# Patient Record
Sex: Female | Born: 1945
Health system: Southern US, Community
[De-identification: ages and names within clinical notes are randomized; demographics above are authoritative.]

## PROBLEM LIST (undated history)

## (undated) DIAGNOSIS — M1990 Unspecified osteoarthritis, unspecified site: Secondary | ICD-10-CM

## (undated) DIAGNOSIS — J189 Pneumonia, unspecified organism: Secondary | ICD-10-CM

## (undated) DIAGNOSIS — J449 Chronic obstructive pulmonary disease, unspecified: Secondary | ICD-10-CM

## (undated) HISTORY — DX: Unspecified osteoarthritis, unspecified site: M19.90

## (undated) HISTORY — DX: Pneumonia, unspecified organism: J18.9

## (undated) HISTORY — DX: Chronic obstructive pulmonary disease, unspecified: J44.9

---

## 1968-01-05 HISTORY — PX: DILATION AND CURETTAGE OF UTERUS: SHX78

## 1997-12-09 ENCOUNTER — Ambulatory Visit (HOSPITAL_COMMUNITY): Admission: RE | Admit: 1997-12-09 | Discharge: 1997-12-09 | Payer: Self-pay | Admitting: *Deleted

## 1997-12-09 ENCOUNTER — Encounter: Payer: Self-pay | Admitting: *Deleted

## 1997-12-09 ENCOUNTER — Other Ambulatory Visit: Admission: RE | Admit: 1997-12-09 | Discharge: 1997-12-09 | Payer: Self-pay | Admitting: *Deleted

## 1998-12-30 ENCOUNTER — Encounter: Payer: Self-pay | Admitting: *Deleted

## 1998-12-30 ENCOUNTER — Ambulatory Visit (HOSPITAL_COMMUNITY): Admission: RE | Admit: 1998-12-30 | Discharge: 1998-12-30 | Payer: Self-pay | Admitting: *Deleted

## 1999-08-12 ENCOUNTER — Other Ambulatory Visit: Admission: RE | Admit: 1999-08-12 | Discharge: 1999-08-12 | Payer: Self-pay | Admitting: *Deleted

## 1999-12-31 ENCOUNTER — Encounter: Payer: Self-pay | Admitting: *Deleted

## 1999-12-31 ENCOUNTER — Ambulatory Visit (HOSPITAL_COMMUNITY): Admission: RE | Admit: 1999-12-31 | Discharge: 1999-12-31 | Payer: Self-pay | Admitting: *Deleted

## 2000-08-15 ENCOUNTER — Other Ambulatory Visit: Admission: RE | Admit: 2000-08-15 | Discharge: 2000-08-15 | Payer: Self-pay | Admitting: *Deleted

## 2001-04-13 ENCOUNTER — Ambulatory Visit (HOSPITAL_COMMUNITY): Admission: RE | Admit: 2001-04-13 | Discharge: 2001-04-13 | Payer: Self-pay | Admitting: Family Medicine

## 2001-09-20 ENCOUNTER — Other Ambulatory Visit: Admission: RE | Admit: 2001-09-20 | Discharge: 2001-09-20 | Payer: Self-pay | Admitting: Obstetrics and Gynecology

## 2003-05-10 ENCOUNTER — Ambulatory Visit (HOSPITAL_COMMUNITY): Admission: RE | Admit: 2003-05-10 | Discharge: 2003-05-10 | Payer: Self-pay | Admitting: Family Medicine

## 2003-05-14 ENCOUNTER — Other Ambulatory Visit: Admission: RE | Admit: 2003-05-14 | Discharge: 2003-05-14 | Payer: Self-pay | Admitting: Family Medicine

## 2003-07-22 ENCOUNTER — Encounter: Admission: RE | Admit: 2003-07-22 | Discharge: 2003-07-22 | Payer: Self-pay | Admitting: Family Medicine

## 2003-07-22 ENCOUNTER — Ambulatory Visit (HOSPITAL_COMMUNITY): Admission: RE | Admit: 2003-07-22 | Discharge: 2003-07-22 | Payer: Self-pay | Admitting: Family Medicine

## 2003-08-05 ENCOUNTER — Encounter (INDEPENDENT_AMBULATORY_CARE_PROVIDER_SITE_OTHER): Payer: Self-pay | Admitting: *Deleted

## 2003-08-05 LAB — CONVERTED CEMR LAB

## 2003-08-06 ENCOUNTER — Encounter: Admission: RE | Admit: 2003-08-06 | Discharge: 2003-08-06 | Payer: Self-pay | Admitting: Family Medicine

## 2003-08-13 ENCOUNTER — Encounter: Admission: RE | Admit: 2003-08-13 | Discharge: 2003-08-13 | Payer: Self-pay | Admitting: Family Medicine

## 2003-08-23 ENCOUNTER — Ambulatory Visit (HOSPITAL_COMMUNITY): Admission: RE | Admit: 2003-08-23 | Discharge: 2003-08-23 | Payer: Self-pay | Admitting: Sports Medicine

## 2003-08-23 ENCOUNTER — Encounter: Payer: Self-pay | Admitting: Cardiovascular Disease

## 2003-08-26 ENCOUNTER — Encounter: Admission: RE | Admit: 2003-08-26 | Discharge: 2003-08-26 | Payer: Self-pay | Admitting: Sports Medicine

## 2003-08-30 ENCOUNTER — Encounter: Admission: RE | Admit: 2003-08-30 | Discharge: 2003-08-30 | Payer: Self-pay | Admitting: Sports Medicine

## 2003-09-17 ENCOUNTER — Ambulatory Visit: Payer: Self-pay | Admitting: Family Medicine

## 2003-09-26 ENCOUNTER — Ambulatory Visit: Payer: Self-pay | Admitting: Family Medicine

## 2003-12-01 ENCOUNTER — Emergency Department (HOSPITAL_COMMUNITY): Admission: EM | Admit: 2003-12-01 | Discharge: 2003-12-01 | Payer: Self-pay | Admitting: Emergency Medicine

## 2006-03-03 DIAGNOSIS — F17200 Nicotine dependence, unspecified, uncomplicated: Secondary | ICD-10-CM | POA: Insufficient documentation

## 2006-03-03 DIAGNOSIS — Z87891 Personal history of nicotine dependence: Secondary | ICD-10-CM | POA: Insufficient documentation

## 2006-03-03 DIAGNOSIS — J449 Chronic obstructive pulmonary disease, unspecified: Secondary | ICD-10-CM | POA: Insufficient documentation

## 2006-03-04 ENCOUNTER — Encounter (INDEPENDENT_AMBULATORY_CARE_PROVIDER_SITE_OTHER): Payer: Self-pay | Admitting: *Deleted

## 2006-07-19 ENCOUNTER — Ambulatory Visit: Payer: Self-pay | Admitting: Family Medicine

## 2006-07-19 ENCOUNTER — Encounter (INDEPENDENT_AMBULATORY_CARE_PROVIDER_SITE_OTHER): Payer: Self-pay | Admitting: *Deleted

## 2006-07-19 LAB — CONVERTED CEMR LAB
ALT: 14 units/L (ref 0–35)
Alkaline Phosphatase: 77 units/L (ref 39–117)
Chloride: 106 meq/L (ref 96–112)
Glucose, Bld: 88 mg/dL (ref 70–99)
HDL: 64 mg/dL (ref >39)
LDL Cholesterol: 81 mg/dL (ref 0–99)
Potassium: 4.5 meq/L (ref 3.5–5.3)
Total CHOL/HDL Ratio: 2.5
Total Protein: 6.9 g/dL (ref 6.0–8.3)

## 2006-07-21 ENCOUNTER — Encounter (INDEPENDENT_AMBULATORY_CARE_PROVIDER_SITE_OTHER): Payer: Self-pay | Admitting: *Deleted

## 2006-07-22 ENCOUNTER — Encounter (INDEPENDENT_AMBULATORY_CARE_PROVIDER_SITE_OTHER): Payer: Self-pay | Admitting: *Deleted

## 2006-07-22 ENCOUNTER — Ambulatory Visit (HOSPITAL_COMMUNITY): Admission: RE | Admit: 2006-07-22 | Discharge: 2006-07-22 | Payer: Self-pay | Admitting: *Deleted

## 2006-07-26 ENCOUNTER — Encounter (INDEPENDENT_AMBULATORY_CARE_PROVIDER_SITE_OTHER): Payer: Self-pay | Admitting: *Deleted

## 2008-03-11 ENCOUNTER — Encounter: Payer: Self-pay | Admitting: Family Medicine

## 2008-03-11 ENCOUNTER — Ambulatory Visit (HOSPITAL_COMMUNITY): Admission: RE | Admit: 2008-03-11 | Discharge: 2008-03-11 | Payer: Self-pay | Admitting: Family Medicine

## 2008-03-11 ENCOUNTER — Ambulatory Visit: Payer: Self-pay | Admitting: Family Medicine

## 2008-03-11 DIAGNOSIS — L301 Dyshidrosis [pompholyx]: Secondary | ICD-10-CM | POA: Insufficient documentation

## 2008-03-11 LAB — CONVERTED CEMR LAB
ALT: 14 units/L (ref 0–35)
CO2: 28 meq/L (ref 19–32)
Calcium: 9.3 mg/dL (ref 8.4–10.5)
Chloride: 103 meq/L (ref 96–112)
Creatinine, Ser: 0.76 mg/dL (ref 0.40–1.20)
HDL: 87 mg/dL (ref >39)
MCHC: 31.7 g/dL (ref 30.0–36.0)
Potassium: 4 meq/L (ref 3.5–5.3)
RBC: 4.54 M/uL (ref 3.87–5.11)
Sodium: 141 meq/L (ref 135–145)
Total Bilirubin: 0.4 mg/dL (ref 0.3–1.2)
Total CHOL/HDL Ratio: 2.4
VLDL: 22 mg/dL (ref 0–40)
WBC: 5.4 10*3/uL (ref 4.0–10.5)

## 2008-03-14 ENCOUNTER — Ambulatory Visit (HOSPITAL_COMMUNITY): Admission: RE | Admit: 2008-03-14 | Discharge: 2008-03-14 | Payer: Self-pay | Admitting: Family Medicine

## 2008-05-27 ENCOUNTER — Telehealth: Payer: Self-pay | Admitting: *Deleted

## 2008-05-27 ENCOUNTER — Ambulatory Visit: Payer: Self-pay | Admitting: Family Medicine

## 2008-05-27 DIAGNOSIS — K089 Disorder of teeth and supporting structures, unspecified: Secondary | ICD-10-CM | POA: Insufficient documentation

## 2008-06-04 ENCOUNTER — Encounter: Payer: Self-pay | Admitting: Family Medicine

## 2010-01-25 ENCOUNTER — Encounter: Payer: Self-pay | Admitting: Family Medicine

## 2010-02-23 ENCOUNTER — Encounter: Payer: Self-pay | Admitting: *Deleted

## 2011-02-03 ENCOUNTER — Encounter: Payer: Self-pay | Admitting: Family Medicine

## 2011-02-03 ENCOUNTER — Ambulatory Visit (INDEPENDENT_AMBULATORY_CARE_PROVIDER_SITE_OTHER): Payer: Self-pay | Admitting: Family Medicine

## 2011-02-03 VITALS — BP 140/66 | HR 96 | Ht 63.0 in | Wt 110.0 lb

## 2011-02-03 DIAGNOSIS — R5383 Other fatigue: Secondary | ICD-10-CM

## 2011-02-03 DIAGNOSIS — Z1211 Encounter for screening for malignant neoplasm of colon: Secondary | ICD-10-CM

## 2011-02-03 DIAGNOSIS — R5381 Other malaise: Secondary | ICD-10-CM

## 2011-02-03 NOTE — Patient Instructions (Signed)
Thank you for coming in today. I am concerned about your weight loss and your fatigue.  We are checking thyroid, iron, kidneys, liver, cholesterol.  I would like you to come back in 1-2 weeks after your blood work.  We will go over everything and start treatment if needed.  You are due to a mammogram.

## 2011-02-03 NOTE — Progress Notes (Signed)
LASHIKA ERKER is a 66 y.o. female who presents to [facility] today for fatigue. She has not been to a doctor in several years and notes a vague feeling of fatigue since the last month. She notes that her sleep is adequate as is her diet. She does sometimes feels lack of energy or ability to go and do the things she normally likes to do. Her family insisted that she go to the doctor today to get evaluated.  She thinks perhaps that she has about a 15 pound weight loss in 2 years.  She feels well otherwise.  Screening: Mammogram was 2 years ago Never has had a colonoscopy but has had stool cards in the past. PHQ9 was 6 today.   PMH reviewed.  ROS as above otherwise neg, no melena or blood in the stool. Medications reviewed.   Exam:  BP 140/66  Pulse 96  Ht 5\' 3"  (1.6 m)  Wt 110 lb (49.896 kg)  BMI 19.49 kg/m2 Gen: Well NAD, thin elderly woman HEENT: EOMI,  MMM Lungs: CTABL Nl WOB slightly prolonged expiratory phase Heart: RRR no MRG Abd: NABS, NT, ND Exts: Non edematous BL  LE, warm and well perfused.

## 2011-02-03 NOTE — Assessment & Plan Note (Signed)
I am obviously concerned about fatigue and a 66 year old woman who is a smoker who has a 15 pound weight loss. Her PHQ9 was negative for depression.   Plan to obtain CBC TSH CMP fasting lipids and Hemoccult stool cards in followup in one week. We'll proceed with workup from there.  At the next visit plan to strongly encourage smoking cessation. She expresses understand.

## 2011-02-05 ENCOUNTER — Other Ambulatory Visit: Payer: Self-pay

## 2011-02-05 DIAGNOSIS — R5383 Other fatigue: Secondary | ICD-10-CM

## 2011-02-05 LAB — CBC
HCT: 42.9 % (ref 36.0–46.0)
Hemoglobin: 13.9 g/dL (ref 12.0–15.0)
MCH: 31.4 pg (ref 26.0–34.0)
RBC: 4.43 MIL/uL (ref 3.87–5.11)

## 2011-02-05 LAB — LIPID PANEL
Cholesterol: 191 mg/dL (ref 0–200)
LDL Cholesterol: 108 mg/dL — ABNORMAL HIGH (ref 0–99)
Total CHOL/HDL Ratio: 2.9 Ratio
Triglycerides: 78 mg/dL (ref <150)
VLDL: 16 mg/dL (ref 0–40)

## 2011-02-05 LAB — TSH: TSH: 1.348 u[IU]/mL (ref 0.350–4.500)

## 2011-02-06 LAB — COMPLETE METABOLIC PANEL WITH GFR
ALT: 21 U/L (ref 0–35)
AST: 24 U/L (ref 0–37)
Alkaline Phosphatase: 74 U/L (ref 39–117)
Chloride: 102 mEq/L (ref 96–112)
Creat: 0.73 mg/dL (ref 0.50–1.10)
GFR, Est African American: >89 mL/min
GFR, Est Non African American: 87 mL/min

## 2011-02-11 LAB — HEMOCCULT GUIAC POC 1CARD (OFFICE)
Card #2 Fecal Occult Blod, POC: NEGATIVE
Card #3 Fecal Occult Blood, POC: NEGATIVE

## 2011-02-11 NOTE — Progress Notes (Signed)
Addended by: [place], Saket Hellstrom on: 02/11/2011 05:00 PM   Modules accepted: Orders

## 2011-02-19 ENCOUNTER — Ambulatory Visit: Payer: Self-pay | Admitting: Family Medicine

## 2011-02-22 ENCOUNTER — Encounter: Payer: Self-pay | Admitting: Family Medicine

## 2011-02-22 ENCOUNTER — Ambulatory Visit (INDEPENDENT_AMBULATORY_CARE_PROVIDER_SITE_OTHER): Payer: Self-pay | Admitting: Family Medicine

## 2011-02-22 DIAGNOSIS — L821 Other seborrheic keratosis: Secondary | ICD-10-CM | POA: Insufficient documentation

## 2011-02-22 DIAGNOSIS — R5383 Other fatigue: Secondary | ICD-10-CM

## 2011-02-22 DIAGNOSIS — F17200 Nicotine dependence, unspecified, uncomplicated: Secondary | ICD-10-CM

## 2011-02-22 DIAGNOSIS — R5381 Other malaise: Secondary | ICD-10-CM

## 2011-02-22 DIAGNOSIS — R634 Abnormal weight loss: Secondary | ICD-10-CM | POA: Insufficient documentation

## 2011-02-22 NOTE — Assessment & Plan Note (Signed)
Skin lesions are consistent with seborrheic keratosis. Discussed with patient with that means and she is reassured. Will followup if any new hyperpigmented lesions are rapidly changing

## 2011-02-22 NOTE — Assessment & Plan Note (Signed)
Problem is essentially resolved. However I remained concerned for the moderate potential of occult malignancy and a long-term smoker with a complaint of fatigue. We'll continue to follow this issue at home and return to clinic if worsening. Plan for well woman exam at her [birth year] in [birth year].

## 2011-02-22 NOTE — Patient Instructions (Signed)
Thank you for coming in today. Please consider quitting smoking.  Let me know if you feel bad again or are losing weight.  Weigh yourself weekly and keep track.  Those spots are seborrheic Keratosis.  Look them up on the Internet.  Make sure you get a mammogram.  Come back soon after your birthday for a well woman exam.

## 2011-02-22 NOTE — Assessment & Plan Note (Signed)
Strongly advised to quit smoking. Patient is thinking about it.

## 2011-02-22 NOTE — Assessment & Plan Note (Signed)
Approximately 8 pound weight loss over the past 3 years. Hemoccult and labs are normal. Will watch over the next year or so for further weight loss. Currently weight is stable in 2 months at 110 pounds.

## 2011-02-22 NOTE — Progress Notes (Signed)
Kelly Snyder is a 66 y.o. female who presents to [facility] today for   1) followup of fatigue: Presented to the family practice Center approximately 2-3 weeks ago with complaint of fatigue and mild weight loss.  In the interim she has recovered and no longer feels tired.  She feels well currently.  2) weight loss: Self-reported 15 pound weight loss but review of data shows an 8 pound weight loss over the past 3 years.   Hemoccult cards, and CMP CBC TSH are all normal.  3) smoking: Is in the pre-contemplative phase of quitting. Has cut back from 2 packs a day to one half pack a day.   4) moles on back: Wonders what they are. Some have popped up in the past few years but none are rapidly growing or changing  PMH reviewed. Significant for smoking ROS as above otherwise neg Medications reviewed. Current Outpatient Prescriptions  Medication Sig Dispense Refill  . Calcium Carbonate-Vitamin D (CALTRATE 600+D) 600-400 MG-UNIT per chew tablet Chew 1 tablet by mouth daily.      . Multiple Vitamin (MULTIVITAMIN) capsule Take 1 capsule by mouth daily.        Exam:  BP 137/76  Pulse 85  Temp(Src) 98.4 F (36.9 C) (Oral)  Ht 5\' 3"  (1.6 m)  Wt 110 lb 3 oz (49.981 kg)  BMI 19.52 kg/m2  Wt Readings from Last 3 Encounters:  02/22/11 110 lb 3 oz (49.981 kg)  02/03/11 110 lb (49.896 kg)  05/27/08 118 lb 14.4 oz (53.933 kg)   Gen: Well NAD HEENT: EOMI,  MMM Exts: Non edematous BL  LE, warm and well perfused.  Skin: Multiple seborrheic keratoses on abdomen and back. No moles noted

## 2011-03-22 ENCOUNTER — Other Ambulatory Visit: Payer: Self-pay | Admitting: Family Medicine

## 2011-03-22 DIAGNOSIS — Z1231 Encounter for screening mammogram for malignant neoplasm of breast: Secondary | ICD-10-CM

## 2011-04-07 ENCOUNTER — Telehealth: Payer: Self-pay | Admitting: Family Medicine

## 2011-04-07 DIAGNOSIS — S025XXA Fracture of tooth (traumatic), initial encounter for closed fracture: Secondary | ICD-10-CM

## 2011-04-07 NOTE — Telephone Encounter (Signed)
Pt states a filling fell out and needs to get in with the dental clinic.

## 2011-04-07 NOTE — Telephone Encounter (Signed)
Called patient and she says her filling has fell out leaving sharp edges on her tooth which is cutting into her gum and tongue. Patient has orange card and is requesting referral to dental clinic. Will forward to PCP for referral.Tarren Sabree, Rodena Medin

## 2011-04-09 NOTE — Telephone Encounter (Signed)
[facility].[place], Rodena Medin

## 2011-04-16 ENCOUNTER — Ambulatory Visit (HOSPITAL_COMMUNITY): Payer: Self-pay

## 2011-04-22 ENCOUNTER — Ambulatory Visit (HOSPITAL_COMMUNITY)
Admission: RE | Admit: 2011-04-22 | Discharge: 2011-04-22 | Disposition: A | Discharge status: Home / Self Care | Payer: Self-pay | Source: Ambulatory Visit | Attending: Family Medicine | Admitting: Family Medicine

## 2011-04-22 DIAGNOSIS — Z1231 Encounter for screening mammogram for malignant neoplasm of breast: Secondary | ICD-10-CM | POA: Insufficient documentation

## 2012-12-20 ENCOUNTER — Ambulatory Visit: Payer: Self-pay | Admitting: Family Medicine

## 2013-01-03 ENCOUNTER — Ambulatory Visit (INDEPENDENT_AMBULATORY_CARE_PROVIDER_SITE_OTHER): Payer: PRIVATE HEALTH INSURANCE | Admitting: Family Medicine

## 2013-01-03 ENCOUNTER — Encounter: Payer: Self-pay | Admitting: Family Medicine

## 2013-01-03 VITALS — BP 127/68 | HR 72 | Ht 63.0 in | Wt 115.3 lb

## 2013-01-03 DIAGNOSIS — R5383 Other fatigue: Secondary | ICD-10-CM

## 2013-01-03 DIAGNOSIS — R5381 Other malaise: Secondary | ICD-10-CM

## 2013-01-03 DIAGNOSIS — Z0000 Encounter for general adult medical examination without abnormal findings: Secondary | ICD-10-CM

## 2013-01-03 DIAGNOSIS — E785 Hyperlipidemia, unspecified: Secondary | ICD-10-CM | POA: Insufficient documentation

## 2013-01-03 DIAGNOSIS — J449 Chronic obstructive pulmonary disease, unspecified: Secondary | ICD-10-CM

## 2013-01-03 DIAGNOSIS — F17200 Nicotine dependence, unspecified, uncomplicated: Secondary | ICD-10-CM

## 2013-01-03 LAB — COMPREHENSIVE METABOLIC PANEL
ALT: 17 U/L (ref 0–35)
AST: 24 U/L (ref 0–37)
Albumin: 4.7 g/dL (ref 3.5–5.2)
Alkaline Phosphatase: 73 U/L (ref 39–117)
BUN: 9 mg/dL (ref 6–23)
CO2: 31 mEq/L (ref 19–32)
Calcium: 10.2 mg/dL (ref 8.4–10.5)
Chloride: 102 mEq/L (ref 96–112)
Sodium: 138 mEq/L (ref 135–145)

## 2013-01-03 LAB — CBC WITH DIFFERENTIAL/PLATELET
Basophils Absolute: 0 10*3/uL (ref 0.0–0.1)
Basophils Relative: 0 % (ref 0–1)
Monocytes Relative: 7 % (ref 3–12)
Neutrophils Relative %: 69 % (ref 43–77)
Platelets: 268 10*3/uL (ref 150–400)
RBC: 4.68 MIL/uL (ref 3.87–5.11)

## 2013-01-03 LAB — LIPID PANEL
HDL: 71 mg/dL (ref >39)
VLDL: 16 mg/dL (ref 0–40)

## 2013-01-03 NOTE — Assessment & Plan Note (Signed)
-   Counseled extensively on recommended immunizations (Tdap, pneumonia vaccine, Zostavax) and cancer screenings - Pt declines immunizations, including flu shot, today. - Mammogram up to date - Stool cards negative last year; consider repeat at upcoming well-woman visit - Pt will consider re-consider immunizations and will likely get Tdap at least, at next f/u

## 2013-01-03 NOTE — Progress Notes (Signed)
   Subjective:    Patient ID: Kelly Snyder, female    DOB: Apr 16, 1945, 67 y.o.   MRN: [id number]  HPI: Pt presents to clinic to meet her new physician and for general follow-up. Pt denies acute issues. She feels well with no specific complaints. She does request routine labwork, "if appropriate."  Chronic issues: COPD - pt has had wheezing in the past and "breathing test" in the hospital, but is not short of breath - pt does not take any controller or rescue medications.  Fatigue - chronic but intermittent, not worse than normal for the last year at least - not on specific medications, denies issues with sleep, does not interfere with daily life - denies feeling faint, chest pain, palpitations, syncopal or near-syncopal episodes  Health maintenance: - mammogram up to date, stool cards negative last year - declines flu shot - declines pneumonia and Zoster immunizations - unsure of last Tdap but declines today  Pt is a current smoker. She has cut back from 1-1.5 packs per day to 0.5 packs per day. She is interested in quitting. In addition to the above documentation, pt's PMH, surgical history, FH, and SH all reviewed and updated where appropriate in the EMR. I have also reviewed and updated the pt's allergies and current medications as appropriate.  Review of Systems: As above. Otherwise, full 12-system ROS was reviewed and all negative.     Objective:   Physical Exam BP 127/68  Pulse 72  Ht 5\' 3"  (1.6 m)  Wt 115 lb 4.8 oz (52.3 kg)  BMI 20.43 kg/m2 Gen: well-appearing adult female in NAD HEENT: [place]/AT, sclerae/conjunctivae clear, no lid lag, EOMI, PERRLA   MMM, posterior oropharynx clear, few scattered cervical lymph nodes  neck supple with full ROM, no masses appreciated; thyroid not enlarged  Cardio: RRR, no murmur appreciated; distal pulses intact/symmetric Pulm: CTAB, very faint end expiratory wheeze, normal WOB  Abd: soft, nondistended, BS+, no HSM Ext:  warm/well-perfused, no cyanosis/clubbing/edema MSK: strength 5/5 in all four extremities, no frank joint deformity/effusion  normal ROM to all four extremities with no point muscle/bony tenderness in spine Neuro/Psych: alert/oriented, sensation grossly intact; normal gait/balance  mood euthymic with congruent affect     Assessment & Plan:  25 minutes was spent in face-to-face time with this patient, more than half of which was devoted to counseling (primarily smoking cessation and immunization recommendations) and development/coordination of care and treatment planning. See individual problem list notes.

## 2013-01-03 NOTE — Patient Instructions (Signed)
Thank you for coming in, today!  It was very nice to meet you! Your blood pressure is good and your weight is stable. We will check several labs today. I will call you or send you a letter with the results. We might consider repeating your stool cards when you come back next time.  Think about getting some immunization shots when you back next year. I will plan to see you back in about 6 months. Please feel free to call with any questions or concerns at any time, at [phone]. --Dr. Casper Harrison

## 2013-01-03 NOTE — Assessment & Plan Note (Signed)
Last lipid panel 02/05/11, LDL mildly elevated at that time (108). Pt has never been on statin and is not treated for HTN, but is a current smoker. Plan to check lipid panel today (fasting), then discuss diet / exercise changes and consider starting statin if indicated, at follow-up.

## 2013-01-03 NOTE — Assessment & Plan Note (Signed)
Currently cut back from previous use, 1-1.5 packs per day to half a pack per day, on her own. Not interested in referral to health coach at this time, but is interested in quitting without pharmaceutical help. Counseled extensively on quitting completely and praised pt on cutting back as much as she has. Provided pt with 1-800-QUIT-NOW number and will continue to counsel at follow-up. Pt appreciative of discussion.

## 2013-01-03 NOTE — Assessment & Plan Note (Signed)
Chronic, long-standing, and intermittent, but not currently bothersome or interfering with daily life. Weight is stable for the past year, as well. Will check CBC and TSH today. Plan to monitor clinically, otherwise.

## 2013-01-03 NOTE — Assessment & Plan Note (Signed)
Pt is a current smoker, not on any medications for COPD currently. Unsure of if/when she has had formal PFT's, but denies current issues with breathing. No new Rx's or referrals placed today. Consider referral to pharmacy clinic for PFT's at follow-up, or if acute issues arise. See also health maintenance and tobacco use problem list note.

## 2013-01-09 ENCOUNTER — Encounter: Payer: Self-pay | Admitting: Family Medicine

## 2013-06-19 ENCOUNTER — Encounter: Payer: Self-pay | Admitting: Home Health Services

## 2013-06-19 ENCOUNTER — Encounter: Payer: Self-pay | Admitting: Family Medicine

## 2013-06-19 ENCOUNTER — Encounter: Payer: PRIVATE HEALTH INSURANCE | Admitting: Home Health Services

## 2013-06-19 ENCOUNTER — Ambulatory Visit (INDEPENDENT_AMBULATORY_CARE_PROVIDER_SITE_OTHER): Payer: PRIVATE HEALTH INSURANCE | Admitting: Family Medicine

## 2013-06-19 VITALS — BP 130/52 | HR 83 | Temp 98.2°F | Ht 63.0 in | Wt 106.3 lb

## 2013-06-19 DIAGNOSIS — E559 Vitamin D deficiency, unspecified: Secondary | ICD-10-CM

## 2013-06-19 DIAGNOSIS — Z0000 Encounter for general adult medical examination without abnormal findings: Secondary | ICD-10-CM

## 2013-06-19 MED ORDER — TETANUS-DIPHTH-ACELL PERTUSSIS 5-2.5-18.5 LF-MCG/0.5 IM SUSP: 0.5000 mL | Freq: Once | INTRAMUSCULAR | Status: DC

## 2013-06-19 NOTE — Progress Notes (Signed)
Patient here for annual wellness visit, patient reports: Risk Factors/Conditions needing evaluation or treatment: Pt. Does not have any risk factors that need evaluation.  Home Safety: Pt lives at home, with her son in a 1 story home.  Pt reports having smoke alarms and does not have adaptive equipment.  Other Information: Corrective lens: Pt has corrective lens for reading, has annual eye exams. Dentures: Pt denies having dentures, has annual dental exams. Pt reports still having all her teeth. Memory: Pt denies memory problems.  Patient's Mini Mental Score (recorded in doc. flowsheet): 28  Bladder:  Pt denies problems with bladder control.  ADL/IADL:  Pt reports independence in all functions. Balance/Gait: Pt reports 0 falls in the past year.  We discussed home safety and fall prevention.    Balance Abnormal Patient value  Sitting balance    Sit to stand    Attempts to arise    Immediate standing balance    Standing balance    Nudge    Eyes closed- Romberg    Tandem stance    Back lean    Neck Rotation    360 degree turn    Sitting down     Pt. Declined hearing exam and information for a colonoscopy at this time.    Annual Wellness Visit Requirements Recorded Today In  Medical, family, social history Past Medical, Family, Social History Section  Current providers Care team  Current medications Medications  Wt, BP, Ht, BMI Vital signs        Tobacco, alcohol, illicit drug use History  ADL Nurse Assessment  Depression Screening Nurse Assessment  Cognitive impairment Nurse Assessment  Mini Mental Status Document Flowsheet  Fall Risk Fall/Depression  Home Safety Progress Note  End of Life Planning (welcome visit) Social Documentation  Medicare preventative services Progress Note  Risk factors/conditions needing evaluation/treatment Progress Note  Personalized health advice Patient Instructions, goals, letter  Diet & Exercise Social Documentation  Emergency Contact  Social Documentation  Seat Belts Social Documentation  Sun exposure/protection Social Documentation

## 2013-06-19 NOTE — Progress Notes (Signed)
Patient here for annual wellness visit, patient reports: Risk Factors/Conditions needing evaluation or treatment: Pt. Does not have any risk factors that need evaluation.  Home Safety: Pt lives at home, with her son in a 1 story home.  Pt reports having smoke alarms and does not have adaptive equipment.  Other Information: Corrective lens: Pt has corrective lens, has annual eye exams. Wears reading glasses. Dentures: Pt does not have dentures, has annual dental exams. Memory: Pt denies memory problems.  Patient's Mini Mental Score (recorded in doc. flowsheet): 28 Bladder:  Pt denies having problems with bladder control.   ADL/IADL:  Pt reports independence in all functions. Balance/Gait: Pt reports 0 falls in the past year.  We discussed home safety and fall prevention.    Balance Abnormal Patient value  Sitting balance    Sit to stand    Attempts to arise    Immediate standing balance    Standing balance    Nudge    Eyes closed- Romberg    Tandem stance    Back lean    Neck Rotation    360 degree turn    Sitting down     Pt declined hearing assessment and information for a colonoscopy at this time.  Pt. Agreed to schedule mammogram and [facility].  Pt reports gardening as her form of exercise.     Annual Wellness Visit Requirements Recorded Today In  Medical, family, social history Past Medical, Family, Social History Section  Current providers Care team  Current medications Medications  Wt, BP, Ht, BMI Vital signs        Tobacco, alcohol, illicit drug use History  ADL Nurse Assessment  Depression Screening Nurse Assessment  Cognitive impairment Nurse Assessment  Mini Mental Status Document Flowsheet  Fall Risk Fall/Depression  Home Safety Progress Note  End of Life Planning (welcome visit) Social Documentation  Medicare preventative services Progress Note  Risk factors/conditions needing evaluation/treatment Progress Note   Personalized health advice Patient Instructions, goals, letter  Diet & Exercise Social Documentation  Emergency Contact Social Documentation  Seat Belts Social Documentation  Sun exposure/protection Social Documentation

## 2013-06-19 NOTE — Patient Instructions (Signed)
Thank you for coming in, today!  Great job cutting back on smoking! Try to quit completely if you can. We will get you set up for a mammogram and DEXA scan. We will probably check some labs the next time you see me.  I will write you a prescription for a tetanus shot that you can get at your pharmacy.  Your skin looks fine. The areas you talked about are nothing to worry about. If you notice skin changes that concern you, call me and we'll consider getting you to see a dermatologist.  Come back to see me in about 1 year, or sooner if you need. Please feel free to call with any questions or concerns at any time, at [phone]. --Dr. Casper HarrisonStreet

## 2013-06-19 NOTE — Progress Notes (Signed)
Subjective:    Patient ID: Kelly Snyder, female    DOB: 11/23/1945, 68 y.o.   MRN: [id number]  HPI: Pt presents to clinic for her annual physical exam.  Current complaints include coryza-type symptoms that pt attribtues to a "bad summer cold," but she generally feels well.  Pt is a current smoker; she has cut back to about 4 cigarettes per day and is not interested in cessation assistance but does want to quit eventually on her own. She is due for a mammogram and requests referral for DEXA scan. She is not sexually active and has no vaginal or urinary complaints. Her weight has varied in the past several years but she has been within 10 lbs of her current weight and denies night sweats, blood in her stool, N/V/D, or appetite issues.  Family History  Problem Relation Age of Onset  . Diabetes Mother   . Heart disease Mother     - no pertinent FH.  No past medical history on file. - no significant medical history other than likely COPD but never formally diagnosed.  No past surgical history on file. - no prior surgeries.  History   Social History  . Marital Status: Divorced    Spouse Name: N/A    Number of Children: 1  . Years of Education: 12   Occupational History  . Not on file.   Social History Main Topics  . Smoking status: Current Every Day Smoker -- 0.50 packs/day    Types: Cigarettes  . Smokeless tobacco: Not on file     Comment: only smokes 1/2 ppd/used to smoke 1 1/2 ppd  . Alcohol Use: No  . Drug Use: No  . Sexual Activity: Not on file   Other Topics Concern  . Not on file   Social History Narrative   Health Care POA:    Emergency Contact: Son-Stephen [phone]   End of Life Plan: Gave pt pamplet   Who lives with you: Son   Any pets: 2 dogs and 5 cats   Diet: Pt reports enjoying fruits and vegetables from her garden.    Exercise: Pt reports walking around at yard sales and gardening during the week.    Seatbelts: Pt reports wearing seatbelt in vehicle   Sun Exposure/Protection: pt reports avoid the sun and only going  Out in the morning or the late evening times.    Hobbies: Pt enjoys going to yard sales.    In addition to the above documentation, pt's PMH, surgical history, FH, and SH all reviewed and updated where appropriate in the EMR. I have also reviewed and updated the pt's allergies and current medications as appropriate.  Review of Systems: As above. Otherwise, full 12-system ROS was reviewed and all negative.      Objective:   Physical Exam BP 130/52  Pulse 83  Temp(Src) 98.2 F (36.8 C) (Oral)  Ht 5\' 3"  (1.6 m)  Wt 106 lb 4.8 oz (48.217 kg)  BMI 18.83 kg/m2 Gen: well-appearing adult female in NAD HEENT: [place]/AT, sclerae/conjunctivae clear, no lid lag, EOMI, PERRLA   MMM, posterior oropharynx clear, no cervical lymphadenopathy  neck supple with full ROM, no masses appreciated; thyroid not enlarged  Cardio: RRR, no murmur appreciated; distal pulses intact/symmetric Pulm: CTAB, no wheezes, normal WOB  Abd: soft, nondistended, BS+, no HSM Ext: warm/well-perfused, no cyanosis/clubbing/edema MSK: strength 5/5 in all four extremities, no frank joint deformity/effusion  normal ROM to all four extremities with no point muscle/bony tenderness in spine  Neuro/Psych: alert/oriented, sensation grossly intact; normal gait/balance  mood euthymic with congruent affect Skin: Numerous seborrheic keratoses and several moles present scattered over body, none inflamed; no other suspicious lesions noted     Assessment & Plan:  68yo female, smoker, but generally healthy. Declines formal smoking cessation assistance.  Anticipatory guidance / risk factor reduction - discussed importance of stoping smoking compeltely - advised pt to try https://www.[place].org/ChooseMyPlate.gov to help with healthy diet choices - advised continued good physical activity to help with general health and bone strength - advised pt to call for any suspicious skin lesions and would  consider dermatology referral if any arise  Immunization / screening / ancillary studies - Offered Tdap, Zoster, and pneumococcal vaccinations; pt declined other than Tdap, with Rx given today - DEXA and mammogram ordered today - labs drawn in Dec, so will hold on further lab draws for now  The above HPI was obtained in conjunction with K. Chanetta Marshallimberlake, MS4. The above also represents my independent exam and assessment / plan.  Bobbye Mortonhristopher M Street, MD PGY-2, [facility] 06/19/2013, 12:29 PM

## 2013-06-19 NOTE — Addendum Note (Signed)
Addended by: Bobbye MortonSTREET, CHRISTOPHER M on: 06/19/2013 04:39 PM   Modules accepted: Level of Service

## 2013-06-19 NOTE — Progress Notes (Signed)
I have reviewed and agree with Heather Cover's documentation.  SUZANNE NICOLE LINEBERRY  

## 2013-06-25 NOTE — Progress Notes (Signed)
Encounter opened in error

## 2013-07-02 ENCOUNTER — Ambulatory Visit
Admission: RE | Admit: 2013-07-02 | Discharge: 2013-07-02 | Disposition: A | Discharge status: Home / Self Care | Payer: PRIVATE HEALTH INSURANCE | Source: Ambulatory Visit

## 2013-07-02 ENCOUNTER — Other Ambulatory Visit: Payer: Self-pay

## 2013-07-02 ENCOUNTER — Ambulatory Visit
Admission: RE | Admit: 2013-07-02 | Discharge: 2013-07-02 | Disposition: A | Discharge status: Home / Self Care | Payer: PRIVATE HEALTH INSURANCE | Source: Ambulatory Visit | Attending: Family Medicine | Admitting: Family Medicine

## 2013-07-02 DIAGNOSIS — Z1231 Encounter for screening mammogram for malignant neoplasm of breast: Secondary | ICD-10-CM

## 2013-07-02 DIAGNOSIS — E559 Vitamin D deficiency, unspecified: Secondary | ICD-10-CM

## 2013-07-03 ENCOUNTER — Encounter: Payer: Self-pay | Admitting: Family Medicine

## 2014-06-18 ENCOUNTER — Ambulatory Visit
Admission: RE | Admit: 2014-06-18 | Discharge: 2014-06-18 | Disposition: A | Discharge status: Home / Self Care | Payer: Medicaid Other | Source: Ambulatory Visit | Attending: Family Medicine | Admitting: Family Medicine

## 2014-06-18 ENCOUNTER — Ambulatory Visit (INDEPENDENT_AMBULATORY_CARE_PROVIDER_SITE_OTHER): Payer: Medicare Other | Admitting: Family Medicine

## 2014-06-18 ENCOUNTER — Encounter: Payer: Self-pay | Admitting: Family Medicine

## 2014-06-18 VITALS — BP 125/86 | HR 86 | Temp 98.3°F | Ht 63.0 in | Wt 113.9 lb

## 2014-06-18 DIAGNOSIS — M25512 Pain in left shoulder: Secondary | ICD-10-CM

## 2014-06-18 DIAGNOSIS — M79602 Pain in left arm: Secondary | ICD-10-CM

## 2014-06-18 DIAGNOSIS — M7500 Adhesive capsulitis of unspecified shoulder: Secondary | ICD-10-CM | POA: Insufficient documentation

## 2014-06-18 MED ORDER — TRAMADOL HCL 50 MG PO TABS: 50.0000 mg | ORAL_TABLET | Freq: Three times a day (TID) | ORAL | Status: DC | PRN

## 2014-06-18 NOTE — Patient Instructions (Addendum)
It was nice to see you today.  Use the pain medication and tylenol as needed.  I will call you with the results of your xray.  Follow up at sports medicine - 945 Friday (6/17).  Address: [address] [place], [place], [place] [address] [phone]

## 2014-06-18 NOTE — Progress Notes (Signed)
   Subjective:    Patient ID: Kelly Snyder, female    DOB: 29-Oct-1945, 69 y.o.   MRN: [id number]  HPI 69 year old female presents for same day appointment complaints of left shoulder and arm pain.  1) Left shoulder/arm pain  Patient states her pain began on Saturday.  Pain starts in the left trapezius region/shoulder and radiates down the arm to the elbow.   Pain is moderate to severe (currently 10/10).   She reports some associated tingling of her lateral upper arm.   Additionally, she has significant trouble moving her arm in any direction.  She does not recall any fall, trauma, injury.  She's been using massage and Tylenol with little improvement.  Her pain is exacerbated by physical activity/motion of her arm.  Social Hx - Current smoker.   Review of Systems  Constitutional: Negative for fever and chills.  Musculoskeletal: Positive for arthralgias.      Objective:   Physical Exam Filed Vitals:   06/18/14 1348  BP: 125/86  Pulse: 86  Temp: 98.3 F (36.8 C)   Vital signs reviewed.  Exam: General: well appearing, NAD. Shoulder: Inspection reveals no abnormalities, atrophy or asymmetry. Palpation - diffusely tender.  ROM decreased markedly in all plains secondary to pain/poor effort.  Rotator cuff strength - unable to assess given severe pain. Could not perform further testing secondary to pain.    Assessment & Plan:  See Problem List

## 2014-06-18 NOTE — Assessment & Plan Note (Addendum)
Patient exhibiting significant shoulder pain with essential inability to move shoulder in all planes.  Additionally, she does endorse some numbness/tingling that could be suggestive of C-spine nerve compression.  However, I would not expect that this would impact her shoulder ROM (especially this quick). Given the fact that I cannot effectively examine her shoulder, I am sending her for an x-ray to rule out underlying pathology/fracture. Treating her pain with tramadol in hopes that it will calm down enough so that she can get an adequate exam at sports medicine later this week. I arranged  for the patient to see Dr. Jennette Kettle at sports medicine on Friday.   Xray - Interpreted personally. No evidence of fracture or soft tissue abnormality.

## 2014-06-21 ENCOUNTER — Encounter: Payer: Self-pay | Admitting: Family Medicine

## 2014-06-21 ENCOUNTER — Ambulatory Visit (INDEPENDENT_AMBULATORY_CARE_PROVIDER_SITE_OTHER): Payer: Medicare Other | Admitting: Family Medicine

## 2014-06-21 VITALS — BP 140/55 | Ht 63.0 in | Wt 115.0 lb

## 2014-06-21 DIAGNOSIS — M7502 Adhesive capsulitis of left shoulder: Secondary | ICD-10-CM

## 2014-06-21 NOTE — Patient Instructions (Signed)
Thank you are right that you slept on your shoulder in odd position and then had some muscle spasm. It does seem like the muscle spasm is improving but you're still quite stiff in the shoulder and I'm concerned you may be starting to develop frozen shoulder which is also called adhesive capsulitis.   As we discussed in clinic today, if you will start doing the wall crawl exercises 4-5 times a day in both directions, forward and with your arm out to the side, for the next 6-8 weeks, then I think you will be back to baseline.   Certainly if you're not back to baseline or you're getting worse, please come back and see [place] sooner. It was great to meet you!

## 2014-06-22 NOTE — Assessment & Plan Note (Signed)
I agree she likely had some type muscle spasm as inciting event. She is at high risk for adhesive capsulitis and may already be started in that direction. We discussed at length. Greater than 50% of our 25 minute office visit was spent in counseling and education regarding these issues. Will start her on wall crawls and she is advised to RTC if she is not progressing as we discussed. I reviewed her x ray pictures with her and reassured her nothing was 'broken". HEP HO given and demonstrated.

## 2014-06-22 NOTE — Progress Notes (Signed)
   Subjective:    Patient ID: Kelly Snyder, female    DOB: [birth year], 69 y.o.   MRN: [id number]  HPI  Sent by Dr. Adriana Simas for further eval and mgmt acute onset L shoulder pain. Awoke with new left sided neck and shoulder pain, hurt so badly she was unwilling to move shoulder when she saw him. He ordered x rays. She has improved in teh interim, ablenow to move more but still pain, especially over anterior shoulder and bicep tendon. Says she al;most cancelled appt because she thinks she "slept wring" and got a neck spasm and it is all resolving now. Has been using some topical cream, a massager and some heat all with improvement. Right hand dominant.  Review of Systems No unusual jont pains or problems recently except asper HPI. No fever or weight change. No numbness in her hands. Denies dixxiness.    Objective:   Physical Exam  Vital signs reviewed. WD WN NAD.  SHULDER LEFT: will actively raise it to 90 degrees in forward flexion and lateral abduction but will not let me passively raiseit much above that secondary to pain. Intact biceps muscle strength and wih no visible defect. Mildly TTP bicipital groove. Very limited IR secondary to pain.  SCAPULA move symmetrically MSK Intact and symmetric strength wrists and hands in all planes. SKIN--no erythema or warmth or unusual swelling of left shoulder noted. VASC; radial pulses symmetrical 2+ X Rays Located. Not a lot of arthritis present.      Assessment & Plan:

## 2015-09-02 ENCOUNTER — Ambulatory Visit (INDEPENDENT_AMBULATORY_CARE_PROVIDER_SITE_OTHER): Payer: Medicare Other | Admitting: *Deleted

## 2015-09-02 ENCOUNTER — Encounter: Payer: Self-pay | Admitting: *Deleted

## 2015-09-02 VITALS — BP 135/67 | HR 72 | Temp 98.1°F | Ht 63.0 in | Wt 111.2 lb

## 2015-09-02 DIAGNOSIS — Z1159 Encounter for screening for other viral diseases: Secondary | ICD-10-CM

## 2015-09-02 DIAGNOSIS — Z0000 Encounter for general adult medical examination without abnormal findings: Secondary | ICD-10-CM

## 2015-09-02 NOTE — Patient Instructions (Signed)
Fat and Cholesterol Restricted Diet High levels of fat and cholesterol in your blood may lead to various health problems, such as diseases of the heart, blood vessels, gallbladder, liver, and pancreas. Fats are concentrated sources of energy that come in various forms. Certain types of fat, including saturated fat, may be harmful in excess. Cholesterol is a substance needed by your body in small amounts. Your body makes all the cholesterol it needs. Excess cholesterol comes from the food you eat. When you have high levels of cholesterol and saturated fat in your blood, health problems can develop because the excess fat and cholesterol will gather along the walls of your blood vessels, causing them to narrow. Choosing the right foods will help you control your intake of fat and cholesterol. This will help keep the levels of these substances in your blood within normal limits and reduce your risk of disease. WHAT IS MY PLAN? Your health care provider recommends that you:  Get no more than __________ % of the total calories in your daily diet from fat.  Limit your intake of saturated fat to less than ______% of your total calories each day.  Limit the amount of cholesterol in your diet to less than _________mg per day. WHAT TYPES OF FAT SHOULD I CHOOSE?  Choose healthy fats more often. Choose monounsaturated and polyunsaturated fats, such as olive and canola oil, flaxseeds, walnuts, almonds, and seeds.  Eat more omega-3 fats. Good choices include salmon, mackerel, sardines, tuna, flaxseed oil, and ground flaxseeds. Aim to eat fish at least two times a week.  Limit saturated fats. Saturated fats are primarily found in animal products, such as meats, butter, and cream. Plant sources of saturated fats include palm oil, palm kernel oil, and coconut oil.  Avoid foods with partially hydrogenated oils in them. These contain trans fats. Examples of foods that contain trans fats are stick margarine, some  tub margarines, cookies, crackers, and other baked goods. WHAT GENERAL GUIDELINES DO I NEED TO FOLLOW? These guidelines for healthy eating will help you control your intake of fat and cholesterol:  Check food labels carefully to identify foods with trans fats or high amounts of saturated fat.  Fill one half of your plate with vegetables and green salads.  Fill one fourth of your plate with whole grains. Look for the word "whole" as the first word in the ingredient list.  Fill one fourth of your plate with lean protein foods.  Limit fruit to two servings a day. Choose fruit instead of juice.  Eat more foods that contain soluble fiber. Examples of foods that contain this type of fiber are apples, broccoli, carrots, beans, peas, and barley. Aim to get 20-30 g of fiber per day.  Eat more home-cooked food and less restaurant, buffet, and fast food.  Limit or avoid alcohol.  Limit foods high in starch and sugar.  Limit fried foods.  Cook foods using methods other than frying. Baking, boiling, grilling, and broiling are all great options.  Lose weight if you are overweight. Losing just 5-10% of your initial body weight can help your overall health and prevent diseases such as diabetes and heart disease. WHAT FOODS CAN I EAT? Grains Whole grains, such as whole wheat or whole grain breads, crackers, cereals, and pasta. Unsweetened oatmeal, bulgur, barley, quinoa, or brown rice. Corn or whole wheat flour tortillas. Vegetables Fresh or frozen vegetables (raw, steamed, roasted, or grilled). Green salads. Fruits All fresh, canned (in natural juice), or frozen fruits. Meat and  Other Protein Products Ground beef (85% or leaner), grass-fed beef, or beef trimmed of fat. Skinless chicken or [place]. Ground chicken or [place]. Pork trimmed of fat. All fish and seafood. Eggs. Dried beans, peas, or lentils. Unsalted nuts or seeds. Unsalted canned or dry beans. Dairy Low-fat dairy products, such as skim  or 1% milk, 2% or reduced-fat cheeses, low-fat ricotta or cottage cheese, or plain low-fat yogurt. Fats and Oils Tub margarines without trans fats. Light or reduced-fat mayonnaise and salad dressings. Avocado. Olive, canola, sesame, or safflower oils. Natural peanut or almond butter (choose ones without added sugar and oil). The items listed above may not be a complete list of recommended foods or beverages. Contact your dietitian for more options. WHAT FOODS ARE NOT RECOMMENDED? Grains White bread. White pasta. White rice. Cornbread. Bagels, pastries, and croissants. Crackers that contain trans fat. Vegetables White potatoes. Corn. Creamed or fried vegetables. Vegetables in a cheese sauce. Fruits Dried fruits. Canned fruit in light or heavy syrup. Fruit juice. Meat and Other Protein Products Fatty cuts of meat. Ribs, chicken wings, bacon, sausage, bologna, salami, chitterlings, fatback, hot dogs, bratwurst, and packaged luncheon meats. Liver and organ meats. Dairy Whole or 2% milk, cream, half-and-half, and cream cheese. Whole milk cheeses. Whole-fat or sweetened yogurt. Full-fat cheeses. Nondairy creamers and whipped toppings. Processed cheese, cheese spreads, or cheese curds. Sweets and Desserts Corn syrup, sugars, honey, and molasses. Candy. Jam and jelly. Syrup. Sweetened cereals. Cookies, pies, cakes, donuts, muffins, and ice cream. Fats and Oils Butter, stick margarine, lard, shortening, ghee, or bacon fat. Coconut, palm kernel, or palm oils. Beverages Alcohol. Sweetened drinks (such as sodas, lemonade, and fruit drinks or punches). The items listed above may not be a complete list of foods and beverages to avoid. Contact your dietitian for more information.   This information is not intended to replace advice given to you by your health care provider. Make sure you discuss any questions you have with your health care provider.   Document Released: 12/21/2004 Document Revised:  01/11/2014 Document Reviewed: 03/21/2013 Elsevier Interactive Patient Education 2016 [place] in the Home  Falls can cause injuries. They can happen to people of all ages. There are many things you can do to make your home safe and to help prevent falls.  WHAT CAN I DO ON THE OUTSIDE OF MY HOME?  Regularly fix the edges of walkways and driveways and fix any cracks.  Remove anything that might make you trip as you walk through a door, such as a raised step or threshold.  Trim any bushes or trees on the path to your home.  Use bright outdoor lighting.  Clear any walking paths of anything that might make someone trip, such as rocks or tools.  Regularly check to see if handrails are loose or broken. Make sure that both sides of any steps have handrails.  Any raised decks and porches should have guardrails on the edges.  Have any leaves, snow, or ice cleared regularly.  Use sand or salt on walking paths during winter.  Clean up any spills in your garage right away. This includes oil or grease spills. WHAT CAN I DO IN THE BATHROOM?   Use night lights.  Install grab bars by the toilet and in the tub and shower. Do not use towel bars as grab bars.  Use non-skid mats or decals in the tub or shower.  If you need to sit down in the shower, use a plastic,  non-slip stool.  Keep the floor dry. Clean up any water that spills on the floor as soon as it happens.  Remove soap buildup in the tub or shower regularly.  Attach bath mats securely with double-sided non-slip rug tape.  Do not have throw rugs and other things on the floor that can make you trip. WHAT CAN I DO IN THE BEDROOM?  Use night lights.  Make sure that you have a light by your bed that is easy to reach.  Do not use any sheets or blankets that are too big for your bed. They should not hang down onto the floor.  Have a firm chair that has side arms. You can use this for support while you get  dressed.  Do not have throw rugs and other things on the floor that can make you trip. WHAT CAN I DO IN THE KITCHEN?  Clean up any spills right away.  Avoid walking on wet floors.  Keep items that you use a lot in easy-to-reach places.  If you need to reach something above you, use a strong step stool that has a grab bar.  Keep electrical cords out of the way.  Do not use floor polish or wax that makes floors slippery. If you must use wax, use non-skid floor wax.  Do not have throw rugs and other things on the floor that can make you trip. WHAT CAN I DO WITH MY STAIRS?  Do not leave any items on the stairs.  Make sure that there are handrails on both sides of the stairs and use them. Fix handrails that are broken or loose. Make sure that handrails are as long as the stairways.  Check any carpeting to make sure that it is firmly attached to the stairs. Fix any carpet that is loose or worn.  Avoid having throw rugs at the top or bottom of the stairs. If you do have throw rugs, attach them to the floor with carpet tape.  Make sure that you have a light switch at the top of the stairs and the bottom of the stairs. If you do not have them, ask someone to add them for you. WHAT ELSE CAN I DO TO HELP PREVENT FALLS?  Wear shoes that:  Do not have high heels.  Have rubber bottoms.  Are comfortable and fit you well.  Are closed at the toe. Do not wear sandals.  If you use a stepladder:  Make sure that it is fully opened. Do not climb a closed stepladder.  Make sure that both sides of the stepladder are locked into place.  Ask someone to hold it for you, if possible.  Clearly mark and make sure that you can see:  Any grab bars or handrails.  First and last steps.  Where the edge of each step is.  Use tools that help you move around (mobility aids) if they are needed. These include:  Canes.  Walkers.  Scooters.  Crutches.  Turn on the lights when you go into a  dark area. Replace any light bulbs as soon as they burn out.  Set up your furniture so you have a clear path. Avoid moving your furniture around.  If any of your floors are uneven, fix them.  If there are any pets around you, be aware of where they are.  Review your medicines with your doctor. Some medicines can make you feel dizzy. This can increase your chance of falling. Ask your doctor what other things  that you can do to help prevent falls.   This information is not intended to replace advice given to you by your health care provider. Make sure you discuss any questions you have with your health care provider.   Document Released: 10/17/2008 Document Revised: 05/07/2014 Document Reviewed: 01/25/2014 Elsevier Interactive Patient Education 2016 [place] Maintenance, Female Adopting a healthy lifestyle and getting preventive care can go a long way to promote health and wellness. Talk with your health care provider about what schedule of regular examinations is right for you. This is a good chance for you to check in with your provider about disease prevention and staying healthy. In between checkups, there are plenty of things you can do on your own. Experts have done a lot of research about which lifestyle changes and preventive measures are most likely to keep you healthy. Ask your health care provider for more information. WEIGHT AND DIET  Eat a healthy diet  Be sure to include plenty of vegetables, fruits, low-fat dairy products, and lean protein.  Do not eat a lot of foods high in solid fats, added sugars, or salt.  Get regular exercise. This is one of the most important things you can do for your health.  Most adults should exercise for at least 150 minutes each week. The exercise should increase your heart rate and make you sweat (moderate-intensity exercise).  Most adults should also do strengthening exercises at least twice a week. This is in addition to the  moderate-intensity exercise.  Maintain a healthy weight  Body mass index (BMI) is a measurement that can be used to identify possible weight problems. It estimates body fat based on height and weight. Your health care provider can help determine your BMI and help you achieve or maintain a healthy weight.  For females 57 years of age and older:   A BMI below 18.5 is considered underweight.  A BMI of 18.5 to 24.9 is normal.  A BMI of 25 to 29.9 is considered overweight.  A BMI of 30 and above is considered obese.  Watch levels of cholesterol and blood lipids  You should start having your blood tested for lipids and cholesterol at 69 years of age, then have this test every 5 years.  You may need to have your cholesterol levels checked more often if:  Your lipid or cholesterol levels are high.  You are older than 69 years of age.  You are at high risk for heart disease.  CANCER SCREENING   Lung Cancer  Lung cancer screening is recommended for adults 62-72 years old who are at high risk for lung cancer because of a history of smoking.  A yearly low-dose CT scan of the lungs is recommended for people who:  Currently smoke.  Have quit within the past 15 years.  Have at least a 30-pack-year history of smoking. A pack year is smoking an average of one pack of cigarettes a day for 1 year.  Yearly screening should continue until it has been 15 years since you quit.  Yearly screening should stop if you develop a health problem that would prevent you from having lung cancer treatment.  Breast Cancer  Practice breast self-awareness. This means understanding how your breasts normally appear and feel.  It also means doing regular breast self-exams. Let your health care provider know about any changes, no matter how small.  If you are in your 20s or 30s, you should have a clinical breast exam (CBE)  by a health care provider every 1-3 years as part of a regular health exam.  If  you are 77 or older, have a CBE every year. Also consider having a breast X-ray (mammogram) every year.  If you have a family history of breast cancer, talk to your health care provider about genetic screening.  If you are at high risk for breast cancer, talk to your health care provider about having an MRI and a mammogram every year.  Breast cancer gene (BRCA) assessment is recommended for women who have family members with BRCA-related cancers. BRCA-related cancers include:  Breast.  Ovarian.  Tubal.  Peritoneal cancers.  Results of the assessment will determine the need for genetic counseling and BRCA1 and BRCA2 testing. Cervical Cancer Your health care provider may recommend that you be screened regularly for cancer of the pelvic organs (ovaries, uterus, and vagina). This screening involves a pelvic examination, including checking for microscopic changes to the surface of your cervix (Pap test). You may be encouraged to have this screening done every 3 years, beginning at age 28.  For women ages 60-65, health care providers may recommend pelvic exams and Pap testing every 3 years, or they may recommend the Pap and pelvic exam, combined with testing for human papilloma virus (HPV), every 5 years. Some types of HPV increase your risk of cervical cancer. Testing for HPV may also be done on women of any age with unclear Pap test results.  Other health care providers may not recommend any screening for nonpregnant women who are considered low risk for pelvic cancer and who do not have symptoms. Ask your health care provider if a screening pelvic exam is right for you.  If you have had past treatment for cervical cancer or a condition that could lead to cancer, you need Pap tests and screening for cancer for at least 20 years after your treatment. If Pap tests have been discontinued, your risk factors (such as having a new sexual partner) need to be reassessed to determine if screening should  resume. Some women have medical problems that increase the chance of getting cervical cancer. In these cases, your health care provider may recommend more frequent screening and Pap tests. Colorectal Cancer  This type of cancer can be detected and often prevented.  Routine colorectal cancer screening usually begins at 69 years of age and continues through 70 years of age.  Your health care provider may recommend screening at an earlier age if you have risk factors for colon cancer.  Your health care provider may also recommend using home test kits to check for hidden blood in the stool.  A small camera at the end of a tube can be used to examine your colon directly (sigmoidoscopy or colonoscopy). This is done to check for the earliest forms of colorectal cancer.  Routine screening usually begins at age 68.  Direct examination of the colon should be repeated every 5-10 years through 69 years of age. However, you may need to be screened more often if early forms of precancerous polyps or small growths are found. Skin Cancer  Check your skin from head to toe regularly.  Tell your health care provider about any new moles or changes in moles, especially if there is a change in a mole's shape or color.  Also tell your health care provider if you have a mole that is larger than the size of a pencil eraser.  Always use sunscreen. Apply sunscreen liberally and repeatedly throughout the  day.  Protect yourself by wearing long sleeves, pants, a wide-brimmed hat, and sunglasses whenever you are outside. HEART DISEASE, DIABETES, AND HIGH BLOOD PRESSURE   High blood pressure causes heart disease and increases the risk of stroke. High blood pressure is more likely to develop in:  People who have blood pressure in the high end of the normal range (130-139/85-89 mm Hg).  People who are overweight or obese.  People who are African American.  If you are 23-77 years of age, have your blood pressure  checked every 3-5 years. If you are 19 years of age or older, have your blood pressure checked every year. You should have your blood pressure measured twice--once when you are at a hospital or clinic, and once when you are not at a hospital or clinic. Record the average of the two measurements. To check your blood pressure when you are not at a hospital or clinic, you can use:  An automated blood pressure machine at a pharmacy.  A home blood pressure monitor.  If you are between 32 years and 25 years old, ask your health care provider if you should take aspirin to prevent strokes.  Have regular diabetes screenings. This involves taking a blood sample to check your fasting blood sugar level.  If you are at a normal weight and have a low risk for diabetes, have this test once every three years after 69 years of age.  If you are overweight and have a high risk for diabetes, consider being tested at a younger age or more often. PREVENTING INFECTION  Hepatitis B  If you have a higher risk for hepatitis B, you should be screened for this virus. You are considered at high risk for hepatitis B if:  You were born in a country where hepatitis B is common. Ask your health care provider which countries are considered high risk.  Your parents were born in a high-risk country, and you have not been immunized against hepatitis B (hepatitis B vaccine).  You have HIV or AIDS.  You use needles to inject street drugs.  You live with someone who has hepatitis B.  You have had sex with someone who has hepatitis B.  You get hemodialysis treatment.  You take certain medicines for conditions, including cancer, organ transplantation, and autoimmune conditions. Hepatitis C  Blood testing is recommended for:  Everyone born from [birth year] through 1964.  Anyone with known risk factors for hepatitis C. Sexually transmitted infections (STIs)  You should be screened for sexually transmitted infections (STIs)  including gonorrhea and chlamydia if:  You are sexually active and are younger than 69 years of age.  You are older than 69 years of age and your health care provider tells you that you are at risk for this type of infection.  Your sexual activity has changed since you were last screened and you are at an increased risk for chlamydia or gonorrhea. Ask your health care provider if you are at risk.  If you do not have HIV, but are at risk, it may be recommended that you take a prescription medicine daily to prevent HIV infection. This is called pre-exposure prophylaxis (PrEP). You are considered at risk if:  You are sexually active and do not regularly use condoms or know the HIV status of your partner(s).  You take drugs by injection.  You are sexually active with a partner who has HIV. Talk with your health care provider about whether you are at high risk of  being infected with HIV. If you choose to begin PrEP, you should first be tested for HIV. You should then be tested every 3 months for as long as you are taking PrEP.  PREGNANCY   If you are premenopausal and you may become pregnant, ask your health care provider about preconception counseling.  If you may become pregnant, take 400 to 800 micrograms (mcg) of folic acid every day.  If you want to prevent pregnancy, talk to your health care provider about birth control (contraception). OSTEOPOROSIS AND MENOPAUSE   Osteoporosis is a disease in which the bones lose minerals and strength with aging. This can result in serious bone fractures. Your risk for osteoporosis can be identified using a bone density scan.  If you are 46 years of age or older, or if you are at risk for osteoporosis and fractures, ask your health care provider if you should be screened.  Ask your health care provider whether you should take a calcium or vitamin D supplement to lower your risk for osteoporosis.  Menopause may have certain physical symptoms and  risks.  Hormone replacement therapy may reduce some of these symptoms and risks. Talk to your health care provider about whether hormone replacement therapy is right for you.  HOME CARE INSTRUCTIONS   Schedule regular health, dental, and eye exams.  Stay current with your immunizations.   Do not use any tobacco products including cigarettes, chewing tobacco, or electronic cigarettes.  If you are pregnant, do not drink alcohol.  If you are breastfeeding, limit how much and how often you drink alcohol.  Limit alcohol intake to no more than 1 drink per day for nonpregnant women. One drink equals 12 ounces of beer, 5 ounces of wine, or 1 ounces of hard liquor.  Do not use street drugs.  Do not share needles.  Ask your health care provider for help if you need support or information about quitting drugs.  Tell your health care provider if you often feel depressed.  Tell your health care provider if you have ever been abused or do not feel safe at home.   This information is not intended to replace advice given to you by your health care provider. Make sure you discuss any questions you have with your health care provider.   Document Released: 07/06/2010 Document Revised: 01/11/2014 Document Reviewed: 11/22/2012 Elsevier Interactive Patient Education 2016 [place] Can Quit Smoking If you are ready to quit smoking or are thinking about it, congratulations! You have chosen to help yourself be healthier and live longer! There are lots of different ways to quit smoking. Nicotine gum, nicotine patches, a nicotine inhaler, or nicotine nasal spray can help with physical craving. Hypnosis, support groups, and medicines help break the habit of smoking. TIPS TO GET OFF AND STAY OFF CIGARETTES  Learn to predict your moods. Do not let a bad situation be your excuse to have a cigarette. Some situations in your life might tempt you to have a cigarette.  Ask friends and co-workers  not to smoke around you.  Make your home smoke-free.  Never have "just one" cigarette. It leads to wanting another and another. Remind yourself of your decision to quit.  On a card, make a list of your reasons for not smoking. Read it at least the same number of times a day as you have a cigarette. Tell yourself everyday, "I do not want to smoke. I choose not to smoke."  Ask someone at home  or work to help you with your plan to quit smoking.  Have something planned after you eat or have a cup of coffee. Take a walk or get other exercise to perk you up. This will help to keep you from overeating.  Try a relaxation exercise to calm you down and decrease your stress. Remember, you may be tense and nervous the first two weeks after you quit. This will pass.  Find new activities to keep your hands busy. Play with a pen, coin, or rubber band. Doodle or draw things on paper.  Brush your teeth right after eating. This will help cut down the craving for the taste of tobacco after meals. You can try mouthwash too.  Try gum, breath mints, or diet candy to keep something in your mouth. IF YOU SMOKE AND WANT TO QUIT:  Do not stock up on cigarettes. Never buy a carton. Wait until one pack is finished before you buy another.  Never carry cigarettes with you at work or at home.  Keep cigarettes as far away from you as possible. Leave them with someone else.  Never carry matches or a lighter with you.  Ask yourself, "Do I need this cigarette or is this just a reflex?"  Bet with someone that you can quit. Put cigarette money in a piggy bank every morning. If you smoke, you give up the money. If you do not smoke, by the end of the week, you keep the money.  Keep trying. It takes 21 days to change a habit!  Talk to your doctor about using medicines to help you quit. These include nicotine replacement gum, lozenges, or skin patches.   This information is not intended to replace advice given to you by  your health care provider. Make sure you discuss any questions you have with your health care provider.   Document Released: 10/17/2008 Document Revised: 03/15/2011 Document Reviewed: 10/17/2008 Elsevier Interactive Patient Education [employer].

## 2015-09-02 NOTE — Progress Notes (Signed)
Subjective:   Kelly Snyder is a 70 y.o. female who presents for an Initial Medicare Annual Wellness Visit.   Cardiac Risk Factors include: advanced age (>32men, >53 women);smoking/ tobacco exposure     Objective:    Today's Vitals   09/02/15 0901  BP: 135/67  Pulse: 72  Temp: 98.1 F (36.7 C)  TempSrc: Oral  SpO2: 96%  Weight: 111 lb 3.2 oz (50.4 kg)  Height: 5\' 3"  (1.6 m)   Body mass index is 19.7 kg/m.   Current Medications (verified) Outpatient Encounter Prescriptions as of 09/02/2015  Medication Sig  . Calcium Carbonate-Vitamin D (CALTRATE 600+D) 600-400 MG-UNIT per chew tablet Chew 1 tablet by mouth daily.  . Multiple Vitamin (MULTIVITAMIN) capsule Take 1 capsule by mouth daily.  . Tdap (BOOSTRIX) 5-2.5-18.5 LF-MCG/0.5 injection Inject 0.5 mLs into the muscle once.  . [DISCONTINUED] traMADol (ULTRAM) 50 MG tablet Take 1 tablet (50 mg total) by mouth every 8 (eight) hours as needed. (Patient not taking: Reported on 09/02/2015)   No facility-administered encounter medications on file as of 09/02/2015.     Allergies (verified) Review of patient's allergies indicates no known allergies.   History: Past Medical History:  Diagnosis Date  . COPD (chronic obstructive pulmonary disease) (HCC)    Past Surgical History:  Procedure Laterality Date  . DILATION AND CURETTAGE OF UTERUS  1970   Family History  Problem Relation Age of Onset  . Diabetes Mother   . Heart disease Mother   . Alcohol abuse Son   . Heart disease Sister    Social History   Occupational History  . retired    Social History Main Topics  . Smoking status: Current Every Day Smoker    Packs/day: 0.50    Years: 49.00    Types: Cigarettes    Start date: 01/04/1966  . Smokeless tobacco: Never Used     Comment: only smokes 1/2 ppd/used to smoke 1 1/2 ppd  . Alcohol use No  . Drug use: No  . Sexual activity: No    Tobacco Counseling Ready to quit: No Counseling given:  Yes   Activities of Daily Living In your present state of health, do you have any difficulty performing the following activities: 09/02/2015  Hearing? N  Vision? N  Difficulty concentrating or making decisions? N  Walking or climbing stairs? N  Dressing or bathing? N  Doing errands, shopping? N  Preparing Food and eating ? N  Using the Toilet? N  In the past six months, have you accidently leaked urine? Y  Do you have problems with loss of bowel control? N  Managing your Medications? N  Managing your Finances? N  Housekeeping or managing your Housekeeping? N  Some recent data might be hidden   Home Safety:  My home has a working smoke alarm:  Yes           My home throw rugs have been fastened down to the floor or removed:  Yes I have non-slip mats in the bathtub and shower:  Yes         All my home's stairs have railings or bannisters: Yes         My home's floors, stairs and hallways are free from clutter, wires and cords:  Yes       Immunizations and Health Maintenance  There is no immunization history on file for this patient. Health Maintenance Due  Topic Date Due  . Hepatitis C Screening  03/12/1945  .  ZOSTAVAX  05/13/2006  . PNA vac Low Risk Adult (1 of 2 - PCV13) 05/13/2011  . MAMMOGRAM  07/03/2015  . INFLUENZA VACCINE  08/05/2015  Hep C drawn today Patient refusing the zostavax, pneumonia and influenza vaccines as well as colonoscopy. [facility] sched [facility], contact info given. Patient will go to local pharmacy to obtain TDaP   Patient Care Team: Bonney AidAlyssa A Haney, MD as PCP - General (Family Medicine)  Indicate any recent Medical Services you may have received from other than [facility] providers in the past year (date may be approximate).     Assessment:   This is a routine wellness examination for Kelly Snyder.   Hearing/Vision screen  Hearing Screening   Method: Audiometry   125Hz  250Hz  500Hz  1000Hz  2000Hz  3000Hz  4000Hz  6000Hz  8000Hz    Right ear:   40 40 40  Fail    Left ear:   Fail Fail 40  Fail      Dietary issues and exercise activities discussed: Current Exercise Habits: Home exercise routine, Type of exercise: walking, Time (Minutes): 25, Frequency (Times/Week): 7, Weekly Exercise (Minutes/Week): 175, Intensity: Mild, Exercise limited by: None identified  Goals    . LDL CALC < 100    . Quit smoking / using tobacco (pt-stated)      Depression Screen PHQ 2/9 Scores 09/02/2015 06/18/2014 06/19/2013 06/19/2013 06/19/2013 01/03/2013  PHQ - 2 Score 0 0 0 0 0 0    Fall Risk Fall Risk  09/02/2015 06/19/2013 06/19/2013 06/19/2013 01/03/2013  Falls in the past year? No No No No No    Cognitive Function: MMSE - Mini Mental State Exam 06/19/2013 06/19/2013  Orientation to time 4 5  Orientation to Place 5 4  Registration 3 3  Attention/ Calculation 5 5  Recall 2 2  Language- name 2 objects 2 2  Language- repeat 1 1  Language- follow 3 step command 3 3  Language- read & follow direction 1 1  Write a sentence 1 1  Copy design 1 1  Total score 28 28   Mini-Cog passed with score 5/5  TUG Test:  Done in 7 seconds. Patient used both hands to push out of chair and to sit back down.  Screening Tests Health Maintenance  Topic Date Due  . Hepatitis C Screening  [birth year]  . ZOSTAVAX  05/13/2006  . PNA vac Low Risk Adult (1 of 2 - PCV13) 05/13/2011  . MAMMOGRAM  07/03/2015  . INFLUENZA VACCINE  08/05/2015  . COLONOSCOPY  02/12/2021  . TETANUS/TDAP  06/20/2023  . DEXA SCAN  Completed      Plan:   Patient encouraged to schedule appt with PCP to have "moles" on back looked at.  During the course of the visit, Kelly Snyder was educated and counseled about the following appropriate screening and preventive services:   Vaccines to include Pneumoccal, Influenza, Td, Zostavax  Cardiovascular disease screening  Colorectal cancer screening  Bone density screening  Diabetes screening  Mammography/PAP  Nutrition  counseling  Smoking cessation counseling  Patient Instructions (the written plan) were given to the patient.    Fredderick SeveranceDUCATTE, LAURENZE L, RN   09/02/2015

## 2015-09-03 LAB — HEPATITIS C ANTIBODY: HCV AB: NEGATIVE

## 2015-09-04 ENCOUNTER — Other Ambulatory Visit: Payer: Self-pay | Admitting: Family Medicine

## 2015-09-04 DIAGNOSIS — Z1231 Encounter for screening mammogram for malignant neoplasm of breast: Secondary | ICD-10-CM

## 2015-09-19 NOTE — Progress Notes (Signed)
I have reviewed this visit and discussed with Lauren Ducatte, RN, BSN, and agree with her documentation.   Bettina Warn A. Hilman Kissling MD, MS Family Medicine Resident PGY-3 Pager [phone]  

## 2015-09-22 ENCOUNTER — Ambulatory Visit (INDEPENDENT_AMBULATORY_CARE_PROVIDER_SITE_OTHER): Payer: Medicare Other | Admitting: Student

## 2015-09-22 ENCOUNTER — Encounter: Payer: Self-pay | Admitting: Student

## 2015-09-22 VITALS — BP 129/59 | HR 75 | Temp 98.0°F | Ht 63.0 in | Wt 111.0 lb

## 2015-09-22 DIAGNOSIS — F17200 Nicotine dependence, unspecified, uncomplicated: Secondary | ICD-10-CM

## 2015-09-22 DIAGNOSIS — Z720 Tobacco use: Secondary | ICD-10-CM | POA: Diagnosis not present

## 2015-09-22 DIAGNOSIS — Z0000 Encounter for general adult medical examination without abnormal findings: Secondary | ICD-10-CM | POA: Diagnosis not present

## 2015-09-22 NOTE — Patient Instructions (Signed)
Follow up in 6 months Please obtain CT chest If you have questions or concerns, call the office at [phone]

## 2015-09-22 NOTE — Progress Notes (Signed)
   Subjective:    Patient ID: Kelly Snyder, female    DOB: 09/10/1945, 70 y.o.   MRN: [id number]   CC: Physical exam  HPI: 70 year old female with history of tobacco abuse,  presents for physical exam, is concerned about 2 moles on her back and one on her nose  Moles - Patient reports two large moles on her back that itch occasionally, they have not had bleeding  - Lesion on patient's nose is more consistent with a blackhead  COPD - Denies shortness of breath, recent exacerbations - Does not take any medication for COPD - Continues to smoke  Tobacco abuse - smokes 1/2 ppd, this is reduced from over 1 ppd - she does not take anything to help with cessation  Smoking status reviewed  1/2 ppd  Review of Systems  Per HPI, else denies recent illness, fever, headache, changes in vision, chest pain, shortness of breath, abdominal pain, N/V/D, weakness    Objective:  BP (!) 129/59   Pulse 75   Temp 98 F (36.7 C) (Oral)   Ht 5\' 3"  (1.6 m)   Wt 111 lb (50.3 kg)   SpO2 95%   BMI 19.66 kg/m  Vitals and nursing note reviewed  General: NAD Cardiac: RRR Respiratory: CTAB, normal effort Abdomen: soft, nontender, Bowel sounds present Skin: multiple benign appearing moles on her back, arms and legs, with two seborrheic keratosis lesions each approximately 1 cm in diameter on her back, no evidence of hypervascularity, no evidence of bleeding, no evidence of erythema or infection, lesion on nose consistent with a black head which was squeezed during the exam Neuro: alert and oriented, no focal deficits   Assessment & Plan:    TOBACCO DEPENDENCE Tobacco abuse disccussed - will continue to discuss smoking cessation  - screening CT chest ordered  Seborrheic keratosis Lesions on back consistent with seborrheic keratosis. Discussed with patient the benign nature of these lesions. She will follow back for skin check should she develop any other concerning lesions. Discussed red  flags  Healthcare maintenance -Mammo scheduled - patient does not desire colonoscopy and had fecal   COPD Stable. Will continue to follow    Franchelle Foskett A. Kennon RoundsHaney MD, MS Family Medicine Resident PGY-3 Pager [phone]

## 2015-09-22 NOTE — Assessment & Plan Note (Addendum)
Tobacco abuse disccussed - will continue to discuss smoking cessation  - screening CT chest ordered

## 2015-09-23 NOTE — Assessment & Plan Note (Signed)
Lesions on back consistent with seborrheic keratosis. Discussed with patient the benign nature of these lesions. She will follow back for skin check should she develop any other concerning lesions. Discussed red flags

## 2015-09-23 NOTE — Assessment & Plan Note (Signed)
-  Mammo scheduled - patient does not desire colonoscopy and had fecal

## 2015-09-23 NOTE — Assessment & Plan Note (Addendum)
Stable.  Will continue to follow

## 2015-09-25 ENCOUNTER — Ambulatory Visit (HOSPITAL_COMMUNITY): Payer: Medicare Other

## 2015-10-14 ENCOUNTER — Ambulatory Visit (HOSPITAL_COMMUNITY)
Admission: RE | Admit: 2015-10-14 | Discharge: 2015-10-14 | Disposition: A | Discharge status: Home / Self Care | Payer: Medicare Other | Source: Ambulatory Visit | Attending: Family Medicine | Admitting: Family Medicine

## 2015-10-14 ENCOUNTER — Encounter (HOSPITAL_COMMUNITY): Payer: Self-pay

## 2015-10-14 ENCOUNTER — Ambulatory Visit
Admission: RE | Admit: 2015-10-14 | Discharge: 2015-10-14 | Disposition: A | Discharge status: Home / Self Care | Payer: Medicare Other | Source: Ambulatory Visit | Attending: Family Medicine | Admitting: Family Medicine

## 2015-10-14 DIAGNOSIS — F17210 Nicotine dependence, cigarettes, uncomplicated: Secondary | ICD-10-CM | POA: Diagnosis present

## 2015-10-14 DIAGNOSIS — Z720 Tobacco use: Secondary | ICD-10-CM | POA: Diagnosis not present

## 2015-10-14 DIAGNOSIS — I2510 Atherosclerotic heart disease of native coronary artery without angina pectoris: Secondary | ICD-10-CM | POA: Diagnosis not present

## 2015-10-14 DIAGNOSIS — I700 Atherosclerosis of aorta: Secondary | ICD-10-CM | POA: Diagnosis not present

## 2015-10-14 DIAGNOSIS — Z1231 Encounter for screening mammogram for malignant neoplasm of breast: Secondary | ICD-10-CM | POA: Diagnosis not present

## 2015-10-17 ENCOUNTER — Telehealth: Payer: Self-pay | Admitting: Student

## 2015-10-17 MED ORDER — TETANUS-DIPHTH-ACELL PERTUSSIS 5-2.5-18.5 LF-MCG/0.5 IM SUSP: 0.5000 mL | Freq: Once | INTRAMUSCULAR | 0 refills | Status: AC

## 2015-10-17 NOTE — Telephone Encounter (Signed)
Patient called regarding the results of her mammogram and chest xray. She additionally requests a tdap vaccine to be faxed to her pharmacy. Will fax it at this time

## 2017-02-05 DIAGNOSIS — K5900 Constipation, unspecified: Secondary | ICD-10-CM | POA: Diagnosis not present

## 2017-02-05 DIAGNOSIS — M545 Low back pain: Secondary | ICD-10-CM | POA: Diagnosis not present

## 2017-02-07 ENCOUNTER — Other Ambulatory Visit: Payer: Self-pay

## 2017-02-07 ENCOUNTER — Emergency Department (HOSPITAL_COMMUNITY)
Admission: EM | Admit: 2017-02-07 | Discharge: 2017-02-07 | Disposition: A | Discharge status: Home / Self Care | Payer: Medicare Other | Attending: Emergency Medicine | Admitting: Emergency Medicine

## 2017-02-07 ENCOUNTER — Emergency Department (HOSPITAL_COMMUNITY): Payer: Medicare Other

## 2017-02-07 ENCOUNTER — Encounter (HOSPITAL_COMMUNITY): Payer: Self-pay | Admitting: Emergency Medicine

## 2017-02-07 DIAGNOSIS — J449 Chronic obstructive pulmonary disease, unspecified: Secondary | ICD-10-CM | POA: Diagnosis not present

## 2017-02-07 DIAGNOSIS — Y929 Unspecified place or not applicable: Secondary | ICD-10-CM | POA: Diagnosis not present

## 2017-02-07 DIAGNOSIS — K5900 Constipation, unspecified: Secondary | ICD-10-CM | POA: Diagnosis not present

## 2017-02-07 DIAGNOSIS — X509XXA Other and unspecified overexertion or strenuous movements or postures, initial encounter: Secondary | ICD-10-CM | POA: Insufficient documentation

## 2017-02-07 DIAGNOSIS — M545 Low back pain: Secondary | ICD-10-CM | POA: Diagnosis not present

## 2017-02-07 DIAGNOSIS — R109 Unspecified abdominal pain: Secondary | ICD-10-CM | POA: Diagnosis not present

## 2017-02-07 DIAGNOSIS — F17210 Nicotine dependence, cigarettes, uncomplicated: Secondary | ICD-10-CM | POA: Diagnosis not present

## 2017-02-07 DIAGNOSIS — Z79899 Other long term (current) drug therapy: Secondary | ICD-10-CM | POA: Diagnosis not present

## 2017-02-07 DIAGNOSIS — S32010A Wedge compression fracture of first lumbar vertebra, initial encounter for closed fracture: Secondary | ICD-10-CM | POA: Diagnosis not present

## 2017-02-07 DIAGNOSIS — Y9389 Activity, other specified: Secondary | ICD-10-CM | POA: Insufficient documentation

## 2017-02-07 DIAGNOSIS — S3992XA Unspecified injury of lower back, initial encounter: Secondary | ICD-10-CM | POA: Diagnosis present

## 2017-02-07 DIAGNOSIS — Y999 Unspecified external cause status: Secondary | ICD-10-CM | POA: Insufficient documentation

## 2017-02-07 LAB — CBC
HCT: 40.1 % (ref 36.0–46.0)
Hemoglobin: 13.6 g/dL (ref 12.0–15.0)
MCH: 32.4 pg (ref 26.0–34.0)
MCHC: 33.9 g/dL (ref 30.0–36.0)
MCV: 95.5 fL (ref 78.0–100.0)
PLATELETS: 331 10*3/uL (ref 150–400)
RBC: 4.2 MIL/uL (ref 3.87–5.11)
RDW: 12.7 % (ref 11.5–15.5)
WBC: 5.7 10*3/uL (ref 4.0–10.5)

## 2017-02-07 LAB — URINALYSIS, ROUTINE W REFLEX MICROSCOPIC
BILIRUBIN URINE: NEGATIVE
Glucose, UA: NEGATIVE mg/dL
HGB URINE DIPSTICK: NEGATIVE
Ketones, ur: NEGATIVE mg/dL
Leukocytes, UA: NEGATIVE
Nitrite: NEGATIVE
PROTEIN: NEGATIVE mg/dL
Specific Gravity, Urine: 1.002 — ABNORMAL LOW (ref 1.005–1.030)
pH: 8 (ref 5.0–8.0)

## 2017-02-07 LAB — COMPREHENSIVE METABOLIC PANEL
ALBUMIN: 4.1 g/dL (ref 3.5–5.0)
ALK PHOS: 116 U/L (ref 38–126)
ALT: 38 U/L (ref 14–54)
AST: 47 U/L — ABNORMAL HIGH (ref 15–41)
Anion gap: 11 (ref 5–15)
CALCIUM: 9.2 mg/dL (ref 8.9–10.3)
CO2: 26 mmol/L (ref 22–32)
CREATININE: 0.6 mg/dL (ref 0.44–1.00)
Chloride: 96 mmol/L — ABNORMAL LOW (ref 101–111)
GFR calc non Af Amer: >60 mL/min (ref >60)
Glucose, Bld: 99 mg/dL (ref 65–99)
Potassium: 3.7 mmol/L (ref 3.5–5.1)
SODIUM: 133 mmol/L — AB (ref 135–145)
Total Bilirubin: 0.7 mg/dL (ref 0.3–1.2)
Total Protein: 6.7 g/dL (ref 6.5–8.1)

## 2017-02-07 LAB — LIPASE, BLOOD: Lipase: 28 U/L (ref 11–51)

## 2017-02-07 MED ORDER — HYDROCODONE-ACETAMINOPHEN 5-325 MG PO TABS: 2.0000 | ORAL_TABLET | ORAL | 0 refills | Status: DC | PRN

## 2017-02-07 MED ORDER — IOPAMIDOL (ISOVUE-300) INJECTION 61%
INTRAVENOUS | Status: AC
  Administered 2017-02-07: 100 mL via INTRAVENOUS
  Filled 2017-02-07: qty 100

## 2017-02-07 MED ORDER — POLYETHYLENE GLYCOL 3350 17 G PO PACK: 17.0000 g | PACK | Freq: Every day | ORAL | 0 refills | Status: DC

## 2017-02-07 MED ORDER — FLEET ENEMA 7-19 GM/118ML RE ENEM
1.0000 | ENEMA | Freq: Once | RECTAL | Status: AC
  Administered 2017-02-07: 1 via RECTAL
  Filled 2017-02-07: qty 1

## 2017-02-07 NOTE — ED Triage Notes (Signed)
Onset 01/27/2017 patient was assisting a family member. Patient attempted to catch her family member to prevent her from falling heard a pop in lower back. Since then pain intermittent. Concerned of pain and constipation. Last bowel movement 01/30/2017. Took laxative last night and still has not had a BM. Abdomen is distended and feels "full"

## 2017-02-07 NOTE — ED Provider Notes (Signed)
Patient placed in Quick Look pathway, seen and evaluated   Chief Complaint: Constipation  HPI:   72 year old female past medical history significant for COPD that presents to the ED with complaints of constipation and generalized abdominal pain.  Patient states that on 1/24 she attempted to catch a falling family member and heard a pop in her lower back.  Since then she reports ongoing low back pain.  Patient was also concerned for constipation.  States her last bowel movement was 1/27.  She has been using over-the-counter laxatives and stool softeners without any relief.  She also reports associated hemorrhoid.  Patient reports generalized abdominal pain that is worse with food.  Nothing makes better.  The pain is a cramping pain that is intermittent.  Denies any associated assist with does report some nausea.  Denies any associated urinary symptoms or fevers. Pt denies any ha, night sweats, hx of ivdu/cancer, loss or bowel or bladder, urinary retention, saddle paresthesias, lower extremity paresthesias.    ROS: Reports associated abdominal pain, change in bowel habits and nausea.  Denies any associated urinary symptoms, emesis, fevers.  (one)  Physical Exam:   Gen: No distress  Neuro: Awake and Alert  Skin: Warm    Focused Exam: Increased bowel sounds.  Generalized abdominal tenderness to palpation without any signs of peritonitis.  Abdomen appears normal.  Patient has midline L-spine tenderness without any deformities or step-offs noted.  Strength 5 out of 5 in lower extremities bilaterally.  Patellar reflexes are normal.  Sensation intact in all dermatomes of the lower extremity.  Full range of motion of lower back.   Initiation of care has begun. The patient has been counseled on the process, plan, and necessity for staying for the completion/evaluation, and the remainder of the medical screening examination.  Blood work and imaging including CT of abdomen pelvis was obtained.    Rise MuLeaphart,  Kenneth T, PA-C 02/07/17 1530    Raeford RazorKohut, Stephen, MD 02/08/17 [phone]

## 2017-02-07 NOTE — Discharge Instructions (Signed)
Return to the ED with any concerns including vomiting, weakness of legs, not able to urinate, loss of control of bowel or bladder, decreased level of alertness/lethargy, or any other alarming symptoms

## 2017-02-07 NOTE — ED Notes (Signed)
Patient transported to X-ray 

## 2017-02-14 ENCOUNTER — Other Ambulatory Visit: Payer: Self-pay | Admitting: Family Medicine

## 2017-02-14 DIAGNOSIS — Z1231 Encounter for screening mammogram for malignant neoplasm of breast: Secondary | ICD-10-CM

## 2017-02-15 ENCOUNTER — Ambulatory Visit
Admission: RE | Admit: 2017-02-15 | Discharge: 2017-02-15 | Disposition: A | Discharge status: Home / Self Care | Payer: Medicare Other | Source: Ambulatory Visit | Attending: Family Medicine | Admitting: Family Medicine

## 2017-02-15 DIAGNOSIS — Z1231 Encounter for screening mammogram for malignant neoplasm of breast: Secondary | ICD-10-CM | POA: Diagnosis not present

## 2017-02-16 DIAGNOSIS — H02831 Dermatochalasis of right upper eyelid: Secondary | ICD-10-CM | POA: Diagnosis not present

## 2017-02-16 DIAGNOSIS — H02834 Dermatochalasis of left upper eyelid: Secondary | ICD-10-CM | POA: Diagnosis not present

## 2017-02-16 NOTE — ED Provider Notes (Signed)
[facility] EMERGENCY DEPARTMENT Provider Note   CSN: [id number] Arrival date & time: 02/07/17  1309     History   Chief Complaint Chief Complaint  Patient presents with  . Back Pain  . Constipation  . Abdominal Pain    HPI Kelly Snyder is a 72 y.o. female.  HPI  Presenting with complaint of constipation.  She states she has not had a bowel movement for 5 days.  She states at one point this week she bent over and felt a pop in her back and has been having back pain ever since.  She has no weakness of her legs no urinary retention or incontinence of bowel or bladder.  She complains of cramping abdominal pain and then feeling associated with needing to pass a bowel movement.  She tried taking MiraLAX without any relief.  There are no other associated systemic symptoms, there are no other alleviating or modifying factors.   Past Medical History:  Diagnosis Date  . Arthritis   . COPD (chronic obstructive pulmonary disease) [facility])     Patient Active Problem List   Diagnosis Date Noted  . Adhesive capsulitis of shoulder, mild. LEFT 06/18/2014  . Other and unspecified hyperlipidemia 01/03/2013  . Healthcare maintenance 01/03/2013  . Seborrheic keratosis 02/22/2011  . Weight loss 02/22/2011  . TOBACCO DEPENDENCE 03/03/2006  . COPD 03/03/2006    Past Surgical History:  Procedure Laterality Date  . DILATION AND CURETTAGE OF UTERUS  1970    OB History    No data available       Home Medications    Prior to Admission medications   Medication Sig Start Date End Date Taking? Authorizing Provider  acetaminophen (TYLENOL) 500 MG tablet Take 500 mg by mouth every 6 (six) hours as needed (pain).   Yes [provider]  Bisacodyl (LAXATIVE PO) Take 2 tablets by mouth daily as needed (constipation).   Yes [provider]  Calcium Carb-Cholecalciferol (CALCIUM 600-D PO) Take 1 tablet by mouth daily.   Yes [provider]  meloxicam  (MOBIC) 7.5 MG tablet Take 7.5 mg by mouth at bedtime. 02/05/17  Yes [provider]  Multiple Vitamin (MULTIVITAMIN WITH MINERALS) TABS tablet Take 1 tablet by mouth daily.   Yes [provider]  HYDROcodone-acetaminophen (NORCO/VICODIN) 5-325 MG tablet Take 2 tablets by mouth every 4 (four) hours as needed. 02/07/17   Mabe, Latanya Maudlin, MD  polyethylene glycol [facility]) packet Take 17 g by mouth daily. 02/07/17   Mabe, Latanya Maudlin, MD    Family History Family History  Problem Relation Age of Onset  . Diabetes Mother   . Heart disease Mother   . Alcohol abuse Son   . Heart disease Sister     Social History Social History   Tobacco Use  . Smoking status: Current Every Day Smoker    Packs/day: 0.50    Years: 49.00    Pack years: 24.50    Types: Cigarettes    Start date: 01/04/1966  . Smokeless tobacco: Never Used  . Tobacco comment: only smokes 1/2 ppd/used to smoke 1 1/2 ppd  Substance Use Topics  . Alcohol use: No  . Drug use: No     Allergies   Patient has no known allergies.   Review of Systems Review of Systems  ROS reviewed and all otherwise negative except for mentioned in HPI   Physical Exam Updated Vital Signs BP (!) 171/78   Pulse 79   Temp 98.4  F (36.9 C) (Oral)   Resp 16   Ht 5\' 3"  (1.6 m)   Wt 49.9 kg (110 lb)   SpO2 96%   BMI 19.49 kg/m  Vitals reviewed Physical Exam  Physical Examination: General appearance - alert, well appearing, and in no distress Mental status - alert, oriented to person, place, and time Eyes - no conjunctival injection, no scleral icterus Chest - clear to auscultation, no wheezes, rales or rhonchi, symmetric air entry Heart - normal rate, regular rhythm, normal S1, S2, no murmurs, rubs, clicks or gallops Abdomen - soft, nontender, nondistended, no masses or organomegaly Back exam - ttp over midline lumbar spine, no CVA tenderness Neurological - alert, oriented x 3, strength 5/5 in extremities x 4, sensation  intact Extremities - peripheral pulses normal, no pedal edema, no clubbing or cyanosis Skin - normal coloration and turgor, no rashes,    ED Treatments / Results  Labs (all labs ordered are listed, but only abnormal results are displayed) Labs Reviewed  COMPREHENSIVE METABOLIC PANEL - Abnormal; Notable for the following components:      Result Value   Sodium 133 (*)    Chloride 96 (*)    BUN <5 (*)    AST 47 (*)    All other components within normal limits  URINALYSIS, ROUTINE W REFLEX MICROSCOPIC - Abnormal; Notable for the following components:   Color, Urine STRAW (*)    Specific Gravity, Urine 1.002 (*)    All other components within normal limits  LIPASE, BLOOD  CBC    EKG  EKG Interpretation None         Procedures Procedures (including critical care time)  Medications Ordered in ED Medications  iopamidol (ISOVUE-300) 61 % injection (100 mLs Intravenous Contrast Given 02/07/17 1814)  sodium phosphate (FLEET) 7-19 GM/118ML enema 1 enema (1 enema Rectal Given 02/07/17 2212)     Initial Impression / Assessment and Plan / ED Course  I have reviewed the triage vital signs and the nursing notes.  Pertinent labs & imaging results that were available during my care of the patient were reviewed by me and considered in my medical decision making (see chart for details).     Patient presents with complaint of low back pain as well as constipation.  On x-ray she has a compression fracture of L1.  No signs or symptoms of cauda equina.  She has no weakness of her legs or retention of urine or incontinence of bowel or bladder.  She was given an enema and had good stool output afterwards.  Advised MiraLAX daily or twice daily.  She will be taking pain medication for her compression fracture and this may worsen constipation so I have talked extensively with her about being diligent with MiraLAX. Discharged with strict return precautions.  Pt agreeable with plan.  Final Clinical  Impressions(s) / ED Diagnoses   Final diagnoses:  Closed compression fracture of first lumbar vertebra, initial encounter (HCC)  Constipation, unspecified constipation type    ED Discharge Orders        Ordered    polyethylene glycol (MIRALAX) packet  Daily     02/07/17 2237    HYDROcodone-acetaminophen (NORCO/VICODIN) 5-325 MG tablet  Every 4 hours PRN     02/07/17 2237       Phillis HaggisMabe, Martha L, MD 02/16/17 1245

## 2017-03-31 ENCOUNTER — Telehealth: Payer: Self-pay

## 2017-03-31 ENCOUNTER — Other Ambulatory Visit: Payer: Self-pay | Admitting: Family Medicine

## 2017-03-31 DIAGNOSIS — E2839 Other primary ovarian failure: Secondary | ICD-10-CM

## 2017-03-31 DIAGNOSIS — Z8639 Personal history of other endocrine, nutritional and metabolic disease: Secondary | ICD-10-CM

## 2017-03-31 NOTE — Telephone Encounter (Signed)
Patient overdue for appointment can she please be informed. Dexa ordered

## 2017-03-31 NOTE — Telephone Encounter (Signed)
[facility] requesting patient be scheduled for a bone density scan that is due.  Anna's callback is [phone] Ples SpecterAlisa Kaulder Zahner, RN [facility] RN)

## 2017-03-31 NOTE — Telephone Encounter (Signed)
LM for both patient and [name] [facility].  Number given for the breast center for patient to schedule the dexa and also that she schedule a follow up visit with her PCP.  Kelly Snyder,CMA

## 2017-04-29 DIAGNOSIS — S51812A Laceration without foreign body of left forearm, initial encounter: Secondary | ICD-10-CM | POA: Diagnosis not present

## 2017-05-02 ENCOUNTER — Encounter: Payer: Self-pay | Admitting: Family Medicine

## 2017-05-02 ENCOUNTER — Ambulatory Visit
Admission: RE | Admit: 2017-05-02 | Discharge: 2017-05-02 | Disposition: A | Discharge status: Home / Self Care | Payer: Medicare Other | Source: Ambulatory Visit | Attending: Family Medicine | Admitting: Family Medicine

## 2017-05-02 DIAGNOSIS — M810 Age-related osteoporosis without current pathological fracture: Secondary | ICD-10-CM | POA: Insufficient documentation

## 2017-05-02 DIAGNOSIS — Z780 Asymptomatic menopausal state: Secondary | ICD-10-CM | POA: Diagnosis not present

## 2017-05-02 DIAGNOSIS — E2839 Other primary ovarian failure: Secondary | ICD-10-CM

## 2017-05-04 ENCOUNTER — Telehealth: Payer: Self-pay

## 2017-05-04 DIAGNOSIS — A498 Other bacterial infections of unspecified site: Secondary | ICD-10-CM | POA: Diagnosis not present

## 2017-05-04 DIAGNOSIS — S51812D Laceration without foreign body of left forearm, subsequent encounter: Secondary | ICD-10-CM | POA: Diagnosis not present

## 2017-05-04 NOTE — Telephone Encounter (Signed)
Patient left message requesting results of bone density scan. Call back is [phone]. Ples Specter, RN [facility] RN)

## 2017-05-04 NOTE — Telephone Encounter (Signed)
Called, no response. Left message. Will try again later.

## 2017-05-04 NOTE — Telephone Encounter (Signed)
Called patient, aware of osteoporosis as she has had this for awhile. Has never been on bisphosphonate but does take calcium supplement. Would like to come in for appointment in June to discuss further before starting. Patient will call back in a few weeks to make an appointment.

## 2017-10-29 DIAGNOSIS — J209 Acute bronchitis, unspecified: Secondary | ICD-10-CM | POA: Diagnosis not present

## 2017-12-24 DIAGNOSIS — R52 Pain, unspecified: Secondary | ICD-10-CM | POA: Diagnosis not present

## 2017-12-24 DIAGNOSIS — S52612A Displaced fracture of left ulna styloid process, initial encounter for closed fracture: Secondary | ICD-10-CM | POA: Diagnosis not present

## 2017-12-24 DIAGNOSIS — S52532A Colles' fracture of left radius, initial encounter for closed fracture: Secondary | ICD-10-CM | POA: Diagnosis not present

## 2017-12-24 DIAGNOSIS — S62102A Fracture of unspecified carpal bone, left wrist, initial encounter for closed fracture: Secondary | ICD-10-CM | POA: Diagnosis not present

## 2017-12-24 DIAGNOSIS — S52592A Other fractures of lower end of left radius, initial encounter for closed fracture: Secondary | ICD-10-CM | POA: Diagnosis not present

## 2017-12-24 DIAGNOSIS — W19XXXA Unspecified fall, initial encounter: Secondary | ICD-10-CM | POA: Diagnosis not present

## 2017-12-26 DIAGNOSIS — S52602A Unspecified fracture of lower end of left ulna, initial encounter for closed fracture: Secondary | ICD-10-CM | POA: Diagnosis not present

## 2017-12-26 DIAGNOSIS — S52502A Unspecified fracture of the lower end of left radius, initial encounter for closed fracture: Secondary | ICD-10-CM | POA: Diagnosis not present

## 2018-01-02 DIAGNOSIS — S52502A Unspecified fracture of the lower end of left radius, initial encounter for closed fracture: Secondary | ICD-10-CM | POA: Diagnosis not present

## 2018-01-12 DIAGNOSIS — S52502A Unspecified fracture of the lower end of left radius, initial encounter for closed fracture: Secondary | ICD-10-CM | POA: Diagnosis not present

## 2018-01-26 DIAGNOSIS — S52502A Unspecified fracture of the lower end of left radius, initial encounter for closed fracture: Secondary | ICD-10-CM | POA: Diagnosis not present

## 2018-02-09 DIAGNOSIS — S52502A Unspecified fracture of the lower end of left radius, initial encounter for closed fracture: Secondary | ICD-10-CM | POA: Diagnosis not present

## 2018-02-13 DIAGNOSIS — S52502A Unspecified fracture of the lower end of left radius, initial encounter for closed fracture: Secondary | ICD-10-CM | POA: Diagnosis not present

## 2018-02-13 DIAGNOSIS — M25432 Effusion, left wrist: Secondary | ICD-10-CM | POA: Diagnosis not present

## 2018-02-13 DIAGNOSIS — M25632 Stiffness of left wrist, not elsewhere classified: Secondary | ICD-10-CM | POA: Diagnosis not present

## 2018-02-13 DIAGNOSIS — M25532 Pain in left wrist: Secondary | ICD-10-CM | POA: Diagnosis not present

## 2018-02-15 DIAGNOSIS — M25632 Stiffness of left wrist, not elsewhere classified: Secondary | ICD-10-CM | POA: Diagnosis not present

## 2018-02-15 DIAGNOSIS — M25532 Pain in left wrist: Secondary | ICD-10-CM | POA: Diagnosis not present

## 2018-02-15 DIAGNOSIS — M25432 Effusion, left wrist: Secondary | ICD-10-CM | POA: Diagnosis not present

## 2018-02-15 DIAGNOSIS — S52502A Unspecified fracture of the lower end of left radius, initial encounter for closed fracture: Secondary | ICD-10-CM | POA: Diagnosis not present

## 2018-02-17 DIAGNOSIS — M25532 Pain in left wrist: Secondary | ICD-10-CM | POA: Diagnosis not present

## 2018-02-17 DIAGNOSIS — M25432 Effusion, left wrist: Secondary | ICD-10-CM | POA: Diagnosis not present

## 2018-02-17 DIAGNOSIS — S52502A Unspecified fracture of the lower end of left radius, initial encounter for closed fracture: Secondary | ICD-10-CM | POA: Diagnosis not present

## 2018-02-17 DIAGNOSIS — M25632 Stiffness of left wrist, not elsewhere classified: Secondary | ICD-10-CM | POA: Diagnosis not present

## 2018-02-20 DIAGNOSIS — M25432 Effusion, left wrist: Secondary | ICD-10-CM | POA: Diagnosis not present

## 2018-02-20 DIAGNOSIS — M25532 Pain in left wrist: Secondary | ICD-10-CM | POA: Diagnosis not present

## 2018-02-20 DIAGNOSIS — S52502A Unspecified fracture of the lower end of left radius, initial encounter for closed fracture: Secondary | ICD-10-CM | POA: Diagnosis not present

## 2018-02-20 DIAGNOSIS — M25632 Stiffness of left wrist, not elsewhere classified: Secondary | ICD-10-CM | POA: Diagnosis not present

## 2018-02-22 DIAGNOSIS — M25632 Stiffness of left wrist, not elsewhere classified: Secondary | ICD-10-CM | POA: Diagnosis not present

## 2018-02-22 DIAGNOSIS — M25532 Pain in left wrist: Secondary | ICD-10-CM | POA: Diagnosis not present

## 2018-02-22 DIAGNOSIS — M25432 Effusion, left wrist: Secondary | ICD-10-CM | POA: Diagnosis not present

## 2018-02-22 DIAGNOSIS — S52502A Unspecified fracture of the lower end of left radius, initial encounter for closed fracture: Secondary | ICD-10-CM | POA: Diagnosis not present

## 2018-02-23 DIAGNOSIS — M25432 Effusion, left wrist: Secondary | ICD-10-CM | POA: Diagnosis not present

## 2018-02-23 DIAGNOSIS — S52502A Unspecified fracture of the lower end of left radius, initial encounter for closed fracture: Secondary | ICD-10-CM | POA: Diagnosis not present

## 2018-02-23 DIAGNOSIS — M25532 Pain in left wrist: Secondary | ICD-10-CM | POA: Diagnosis not present

## 2018-02-23 DIAGNOSIS — M25632 Stiffness of left wrist, not elsewhere classified: Secondary | ICD-10-CM | POA: Diagnosis not present

## 2018-02-28 DIAGNOSIS — M25432 Effusion, left wrist: Secondary | ICD-10-CM | POA: Diagnosis not present

## 2018-02-28 DIAGNOSIS — S52502A Unspecified fracture of the lower end of left radius, initial encounter for closed fracture: Secondary | ICD-10-CM | POA: Diagnosis not present

## 2018-02-28 DIAGNOSIS — M25632 Stiffness of left wrist, not elsewhere classified: Secondary | ICD-10-CM | POA: Diagnosis not present

## 2018-02-28 DIAGNOSIS — M25532 Pain in left wrist: Secondary | ICD-10-CM | POA: Diagnosis not present

## 2018-03-01 DIAGNOSIS — M25632 Stiffness of left wrist, not elsewhere classified: Secondary | ICD-10-CM | POA: Diagnosis not present

## 2018-03-01 DIAGNOSIS — M25532 Pain in left wrist: Secondary | ICD-10-CM | POA: Diagnosis not present

## 2018-03-01 DIAGNOSIS — S52502A Unspecified fracture of the lower end of left radius, initial encounter for closed fracture: Secondary | ICD-10-CM | POA: Diagnosis not present

## 2018-03-01 DIAGNOSIS — M25432 Effusion, left wrist: Secondary | ICD-10-CM | POA: Diagnosis not present

## 2018-03-03 DIAGNOSIS — S52502A Unspecified fracture of the lower end of left radius, initial encounter for closed fracture: Secondary | ICD-10-CM | POA: Diagnosis not present

## 2018-03-03 DIAGNOSIS — M25632 Stiffness of left wrist, not elsewhere classified: Secondary | ICD-10-CM | POA: Diagnosis not present

## 2018-03-03 DIAGNOSIS — M25432 Effusion, left wrist: Secondary | ICD-10-CM | POA: Diagnosis not present

## 2018-03-03 DIAGNOSIS — M25532 Pain in left wrist: Secondary | ICD-10-CM | POA: Diagnosis not present

## 2018-03-07 DIAGNOSIS — M25632 Stiffness of left wrist, not elsewhere classified: Secondary | ICD-10-CM | POA: Diagnosis not present

## 2018-03-07 DIAGNOSIS — M25432 Effusion, left wrist: Secondary | ICD-10-CM | POA: Diagnosis not present

## 2018-03-07 DIAGNOSIS — M25532 Pain in left wrist: Secondary | ICD-10-CM | POA: Diagnosis not present

## 2018-03-07 DIAGNOSIS — S52502A Unspecified fracture of the lower end of left radius, initial encounter for closed fracture: Secondary | ICD-10-CM | POA: Diagnosis not present

## 2018-03-10 DIAGNOSIS — M25532 Pain in left wrist: Secondary | ICD-10-CM | POA: Diagnosis not present

## 2018-03-10 DIAGNOSIS — M25432 Effusion, left wrist: Secondary | ICD-10-CM | POA: Diagnosis not present

## 2018-03-10 DIAGNOSIS — S52502A Unspecified fracture of the lower end of left radius, initial encounter for closed fracture: Secondary | ICD-10-CM | POA: Diagnosis not present

## 2018-03-10 DIAGNOSIS — M25632 Stiffness of left wrist, not elsewhere classified: Secondary | ICD-10-CM | POA: Diagnosis not present

## 2018-03-13 DIAGNOSIS — M25432 Effusion, left wrist: Secondary | ICD-10-CM | POA: Diagnosis not present

## 2018-03-13 DIAGNOSIS — M25532 Pain in left wrist: Secondary | ICD-10-CM | POA: Diagnosis not present

## 2018-03-13 DIAGNOSIS — M25632 Stiffness of left wrist, not elsewhere classified: Secondary | ICD-10-CM | POA: Diagnosis not present

## 2018-03-13 DIAGNOSIS — S52502A Unspecified fracture of the lower end of left radius, initial encounter for closed fracture: Secondary | ICD-10-CM | POA: Diagnosis not present

## 2018-03-15 DIAGNOSIS — M25432 Effusion, left wrist: Secondary | ICD-10-CM | POA: Diagnosis not present

## 2018-03-15 DIAGNOSIS — M25532 Pain in left wrist: Secondary | ICD-10-CM | POA: Diagnosis not present

## 2018-03-15 DIAGNOSIS — M25632 Stiffness of left wrist, not elsewhere classified: Secondary | ICD-10-CM | POA: Diagnosis not present

## 2018-03-15 DIAGNOSIS — S52502A Unspecified fracture of the lower end of left radius, initial encounter for closed fracture: Secondary | ICD-10-CM | POA: Diagnosis not present

## 2018-03-17 DIAGNOSIS — M25532 Pain in left wrist: Secondary | ICD-10-CM | POA: Diagnosis not present

## 2018-03-17 DIAGNOSIS — S52502A Unspecified fracture of the lower end of left radius, initial encounter for closed fracture: Secondary | ICD-10-CM | POA: Diagnosis not present

## 2018-03-17 DIAGNOSIS — M25432 Effusion, left wrist: Secondary | ICD-10-CM | POA: Diagnosis not present

## 2018-03-17 DIAGNOSIS — M25632 Stiffness of left wrist, not elsewhere classified: Secondary | ICD-10-CM | POA: Diagnosis not present

## 2018-03-20 DIAGNOSIS — M25532 Pain in left wrist: Secondary | ICD-10-CM | POA: Diagnosis not present

## 2018-03-20 DIAGNOSIS — S52502A Unspecified fracture of the lower end of left radius, initial encounter for closed fracture: Secondary | ICD-10-CM | POA: Diagnosis not present

## 2018-03-20 DIAGNOSIS — M25632 Stiffness of left wrist, not elsewhere classified: Secondary | ICD-10-CM | POA: Diagnosis not present

## 2018-03-20 DIAGNOSIS — M25432 Effusion, left wrist: Secondary | ICD-10-CM | POA: Diagnosis not present

## 2018-03-22 DIAGNOSIS — M25432 Effusion, left wrist: Secondary | ICD-10-CM | POA: Diagnosis not present

## 2018-03-22 DIAGNOSIS — S52502A Unspecified fracture of the lower end of left radius, initial encounter for closed fracture: Secondary | ICD-10-CM | POA: Diagnosis not present

## 2018-03-22 DIAGNOSIS — M25532 Pain in left wrist: Secondary | ICD-10-CM | POA: Diagnosis not present

## 2018-03-22 DIAGNOSIS — M25632 Stiffness of left wrist, not elsewhere classified: Secondary | ICD-10-CM | POA: Diagnosis not present

## 2018-03-24 DIAGNOSIS — M25432 Effusion, left wrist: Secondary | ICD-10-CM | POA: Diagnosis not present

## 2018-03-24 DIAGNOSIS — S52502A Unspecified fracture of the lower end of left radius, initial encounter for closed fracture: Secondary | ICD-10-CM | POA: Diagnosis not present

## 2018-03-24 DIAGNOSIS — M25632 Stiffness of left wrist, not elsewhere classified: Secondary | ICD-10-CM | POA: Diagnosis not present

## 2018-03-24 DIAGNOSIS — M25532 Pain in left wrist: Secondary | ICD-10-CM | POA: Diagnosis not present

## 2018-03-27 DIAGNOSIS — M25432 Effusion, left wrist: Secondary | ICD-10-CM | POA: Diagnosis not present

## 2018-03-27 DIAGNOSIS — M25632 Stiffness of left wrist, not elsewhere classified: Secondary | ICD-10-CM | POA: Diagnosis not present

## 2018-03-27 DIAGNOSIS — M25532 Pain in left wrist: Secondary | ICD-10-CM | POA: Diagnosis not present

## 2018-03-27 DIAGNOSIS — S52502A Unspecified fracture of the lower end of left radius, initial encounter for closed fracture: Secondary | ICD-10-CM | POA: Diagnosis not present

## 2018-05-08 DIAGNOSIS — S52502A Unspecified fracture of the lower end of left radius, initial encounter for closed fracture: Secondary | ICD-10-CM | POA: Diagnosis not present

## 2018-05-10 DIAGNOSIS — M25432 Effusion, left wrist: Secondary | ICD-10-CM | POA: Diagnosis not present

## 2018-05-10 DIAGNOSIS — S52502A Unspecified fracture of the lower end of left radius, initial encounter for closed fracture: Secondary | ICD-10-CM | POA: Diagnosis not present

## 2018-05-10 DIAGNOSIS — M25632 Stiffness of left wrist, not elsewhere classified: Secondary | ICD-10-CM | POA: Diagnosis not present

## 2018-05-10 DIAGNOSIS — M6281 Muscle weakness (generalized): Secondary | ICD-10-CM | POA: Diagnosis not present

## 2018-05-10 DIAGNOSIS — M25532 Pain in left wrist: Secondary | ICD-10-CM | POA: Diagnosis not present

## 2018-05-11 DIAGNOSIS — M6281 Muscle weakness (generalized): Secondary | ICD-10-CM | POA: Diagnosis not present

## 2018-05-11 DIAGNOSIS — M25432 Effusion, left wrist: Secondary | ICD-10-CM | POA: Diagnosis not present

## 2018-05-11 DIAGNOSIS — S52502A Unspecified fracture of the lower end of left radius, initial encounter for closed fracture: Secondary | ICD-10-CM | POA: Diagnosis not present

## 2018-05-11 DIAGNOSIS — M25632 Stiffness of left wrist, not elsewhere classified: Secondary | ICD-10-CM | POA: Diagnosis not present

## 2018-05-11 DIAGNOSIS — M25532 Pain in left wrist: Secondary | ICD-10-CM | POA: Diagnosis not present

## 2018-05-15 DIAGNOSIS — M6281 Muscle weakness (generalized): Secondary | ICD-10-CM | POA: Diagnosis not present

## 2018-05-15 DIAGNOSIS — M25532 Pain in left wrist: Secondary | ICD-10-CM | POA: Diagnosis not present

## 2018-05-15 DIAGNOSIS — M25632 Stiffness of left wrist, not elsewhere classified: Secondary | ICD-10-CM | POA: Diagnosis not present

## 2018-05-15 DIAGNOSIS — S52502A Unspecified fracture of the lower end of left radius, initial encounter for closed fracture: Secondary | ICD-10-CM | POA: Diagnosis not present

## 2018-05-15 DIAGNOSIS — M25432 Effusion, left wrist: Secondary | ICD-10-CM | POA: Diagnosis not present

## 2018-05-17 DIAGNOSIS — M25532 Pain in left wrist: Secondary | ICD-10-CM | POA: Diagnosis not present

## 2018-05-17 DIAGNOSIS — M25432 Effusion, left wrist: Secondary | ICD-10-CM | POA: Diagnosis not present

## 2018-05-17 DIAGNOSIS — S52502A Unspecified fracture of the lower end of left radius, initial encounter for closed fracture: Secondary | ICD-10-CM | POA: Diagnosis not present

## 2018-05-17 DIAGNOSIS — M6281 Muscle weakness (generalized): Secondary | ICD-10-CM | POA: Diagnosis not present

## 2018-05-17 DIAGNOSIS — M25632 Stiffness of left wrist, not elsewhere classified: Secondary | ICD-10-CM | POA: Diagnosis not present

## 2018-05-22 DIAGNOSIS — M25632 Stiffness of left wrist, not elsewhere classified: Secondary | ICD-10-CM | POA: Diagnosis not present

## 2018-05-22 DIAGNOSIS — M25432 Effusion, left wrist: Secondary | ICD-10-CM | POA: Diagnosis not present

## 2018-05-22 DIAGNOSIS — M6281 Muscle weakness (generalized): Secondary | ICD-10-CM | POA: Diagnosis not present

## 2018-05-22 DIAGNOSIS — S52502A Unspecified fracture of the lower end of left radius, initial encounter for closed fracture: Secondary | ICD-10-CM | POA: Diagnosis not present

## 2018-05-22 DIAGNOSIS — M25532 Pain in left wrist: Secondary | ICD-10-CM | POA: Diagnosis not present

## 2018-05-25 DIAGNOSIS — M25532 Pain in left wrist: Secondary | ICD-10-CM | POA: Diagnosis not present

## 2018-05-25 DIAGNOSIS — S52502A Unspecified fracture of the lower end of left radius, initial encounter for closed fracture: Secondary | ICD-10-CM | POA: Diagnosis not present

## 2018-05-25 DIAGNOSIS — M6281 Muscle weakness (generalized): Secondary | ICD-10-CM | POA: Diagnosis not present

## 2018-05-25 DIAGNOSIS — M25632 Stiffness of left wrist, not elsewhere classified: Secondary | ICD-10-CM | POA: Diagnosis not present

## 2018-05-25 DIAGNOSIS — M25432 Effusion, left wrist: Secondary | ICD-10-CM | POA: Diagnosis not present

## 2018-05-30 DIAGNOSIS — M6281 Muscle weakness (generalized): Secondary | ICD-10-CM | POA: Diagnosis not present

## 2018-05-30 DIAGNOSIS — M25632 Stiffness of left wrist, not elsewhere classified: Secondary | ICD-10-CM | POA: Diagnosis not present

## 2018-05-30 DIAGNOSIS — M25432 Effusion, left wrist: Secondary | ICD-10-CM | POA: Diagnosis not present

## 2018-05-30 DIAGNOSIS — M25532 Pain in left wrist: Secondary | ICD-10-CM | POA: Diagnosis not present

## 2018-05-30 DIAGNOSIS — S52502A Unspecified fracture of the lower end of left radius, initial encounter for closed fracture: Secondary | ICD-10-CM | POA: Diagnosis not present

## 2018-06-01 DIAGNOSIS — M25532 Pain in left wrist: Secondary | ICD-10-CM | POA: Diagnosis not present

## 2018-06-01 DIAGNOSIS — S52502A Unspecified fracture of the lower end of left radius, initial encounter for closed fracture: Secondary | ICD-10-CM | POA: Diagnosis not present

## 2018-06-01 DIAGNOSIS — M25432 Effusion, left wrist: Secondary | ICD-10-CM | POA: Diagnosis not present

## 2018-06-01 DIAGNOSIS — M6281 Muscle weakness (generalized): Secondary | ICD-10-CM | POA: Diagnosis not present

## 2018-06-01 DIAGNOSIS — M25632 Stiffness of left wrist, not elsewhere classified: Secondary | ICD-10-CM | POA: Diagnosis not present

## 2018-06-05 DIAGNOSIS — M25532 Pain in left wrist: Secondary | ICD-10-CM | POA: Diagnosis not present

## 2018-06-05 DIAGNOSIS — M25432 Effusion, left wrist: Secondary | ICD-10-CM | POA: Diagnosis not present

## 2018-06-05 DIAGNOSIS — M6281 Muscle weakness (generalized): Secondary | ICD-10-CM | POA: Diagnosis not present

## 2018-06-05 DIAGNOSIS — S52502A Unspecified fracture of the lower end of left radius, initial encounter for closed fracture: Secondary | ICD-10-CM | POA: Diagnosis not present

## 2018-06-05 DIAGNOSIS — M25632 Stiffness of left wrist, not elsewhere classified: Secondary | ICD-10-CM | POA: Diagnosis not present

## 2018-06-08 DIAGNOSIS — S52502A Unspecified fracture of the lower end of left radius, initial encounter for closed fracture: Secondary | ICD-10-CM | POA: Diagnosis not present

## 2018-06-08 DIAGNOSIS — M25432 Effusion, left wrist: Secondary | ICD-10-CM | POA: Diagnosis not present

## 2018-06-08 DIAGNOSIS — M6281 Muscle weakness (generalized): Secondary | ICD-10-CM | POA: Diagnosis not present

## 2018-06-08 DIAGNOSIS — M25632 Stiffness of left wrist, not elsewhere classified: Secondary | ICD-10-CM | POA: Diagnosis not present

## 2018-06-08 DIAGNOSIS — M25532 Pain in left wrist: Secondary | ICD-10-CM | POA: Diagnosis not present

## 2018-06-13 DIAGNOSIS — M25532 Pain in left wrist: Secondary | ICD-10-CM | POA: Diagnosis not present

## 2018-06-13 DIAGNOSIS — M25632 Stiffness of left wrist, not elsewhere classified: Secondary | ICD-10-CM | POA: Diagnosis not present

## 2018-06-13 DIAGNOSIS — M25432 Effusion, left wrist: Secondary | ICD-10-CM | POA: Diagnosis not present

## 2018-06-13 DIAGNOSIS — S52502A Unspecified fracture of the lower end of left radius, initial encounter for closed fracture: Secondary | ICD-10-CM | POA: Diagnosis not present

## 2018-06-13 DIAGNOSIS — M6281 Muscle weakness (generalized): Secondary | ICD-10-CM | POA: Diagnosis not present

## 2018-06-15 DIAGNOSIS — M25532 Pain in left wrist: Secondary | ICD-10-CM | POA: Diagnosis not present

## 2018-06-15 DIAGNOSIS — M25432 Effusion, left wrist: Secondary | ICD-10-CM | POA: Diagnosis not present

## 2018-06-15 DIAGNOSIS — M6281 Muscle weakness (generalized): Secondary | ICD-10-CM | POA: Diagnosis not present

## 2018-06-15 DIAGNOSIS — S52502A Unspecified fracture of the lower end of left radius, initial encounter for closed fracture: Secondary | ICD-10-CM | POA: Diagnosis not present

## 2018-06-15 DIAGNOSIS — M25632 Stiffness of left wrist, not elsewhere classified: Secondary | ICD-10-CM | POA: Diagnosis not present

## 2018-06-19 DIAGNOSIS — S52502A Unspecified fracture of the lower end of left radius, initial encounter for closed fracture: Secondary | ICD-10-CM | POA: Diagnosis not present

## 2018-06-19 DIAGNOSIS — M25532 Pain in left wrist: Secondary | ICD-10-CM | POA: Diagnosis not present

## 2018-06-19 DIAGNOSIS — M6281 Muscle weakness (generalized): Secondary | ICD-10-CM | POA: Diagnosis not present

## 2018-06-19 DIAGNOSIS — M25432 Effusion, left wrist: Secondary | ICD-10-CM | POA: Diagnosis not present

## 2018-06-19 DIAGNOSIS — M25632 Stiffness of left wrist, not elsewhere classified: Secondary | ICD-10-CM | POA: Diagnosis not present

## 2018-06-22 DIAGNOSIS — M25632 Stiffness of left wrist, not elsewhere classified: Secondary | ICD-10-CM | POA: Diagnosis not present

## 2018-06-22 DIAGNOSIS — M25532 Pain in left wrist: Secondary | ICD-10-CM | POA: Diagnosis not present

## 2018-06-22 DIAGNOSIS — S52502A Unspecified fracture of the lower end of left radius, initial encounter for closed fracture: Secondary | ICD-10-CM | POA: Diagnosis not present

## 2018-06-22 DIAGNOSIS — M6281 Muscle weakness (generalized): Secondary | ICD-10-CM | POA: Diagnosis not present

## 2018-06-22 DIAGNOSIS — M25432 Effusion, left wrist: Secondary | ICD-10-CM | POA: Diagnosis not present

## 2018-08-17 DIAGNOSIS — M1812 Unilateral primary osteoarthritis of first carpometacarpal joint, left hand: Secondary | ICD-10-CM | POA: Diagnosis not present

## 2018-08-17 DIAGNOSIS — S52502P Unspecified fracture of the lower end of left radius, subsequent encounter for closed fracture with malunion: Secondary | ICD-10-CM | POA: Diagnosis not present

## 2018-08-17 DIAGNOSIS — S52602P Unspecified fracture of lower end of left ulna, subsequent encounter for closed fracture with malunion: Secondary | ICD-10-CM | POA: Diagnosis not present

## 2018-11-02 ENCOUNTER — Other Ambulatory Visit: Payer: Self-pay

## 2018-11-02 DIAGNOSIS — R0781 Pleurodynia: Secondary | ICD-10-CM | POA: Diagnosis not present

## 2018-11-02 DIAGNOSIS — R079 Chest pain, unspecified: Secondary | ICD-10-CM | POA: Diagnosis not present

## 2018-11-02 DIAGNOSIS — F17210 Nicotine dependence, cigarettes, uncomplicated: Secondary | ICD-10-CM | POA: Diagnosis present

## 2018-11-02 DIAGNOSIS — Z8249 Family history of ischemic heart disease and other diseases of the circulatory system: Secondary | ICD-10-CM | POA: Diagnosis not present

## 2018-11-02 DIAGNOSIS — J441 Chronic obstructive pulmonary disease with (acute) exacerbation: Secondary | ICD-10-CM | POA: Diagnosis not present

## 2018-11-02 DIAGNOSIS — Z833 Family history of diabetes mellitus: Secondary | ICD-10-CM | POA: Diagnosis not present

## 2018-11-02 DIAGNOSIS — I700 Atherosclerosis of aorta: Secondary | ICD-10-CM | POA: Diagnosis not present

## 2018-11-02 DIAGNOSIS — J180 Bronchopneumonia, unspecified organism: Secondary | ICD-10-CM | POA: Diagnosis not present

## 2018-11-02 DIAGNOSIS — J440 Chronic obstructive pulmonary disease with acute lower respiratory infection: Secondary | ICD-10-CM | POA: Diagnosis not present

## 2018-11-02 DIAGNOSIS — Z9981 Dependence on supplemental oxygen: Secondary | ICD-10-CM

## 2018-11-02 DIAGNOSIS — M1990 Unspecified osteoarthritis, unspecified site: Secondary | ICD-10-CM | POA: Diagnosis present

## 2018-11-02 DIAGNOSIS — J9601 Acute respiratory failure with hypoxia: Secondary | ICD-10-CM | POA: Diagnosis not present

## 2018-11-02 DIAGNOSIS — R05 Cough: Secondary | ICD-10-CM | POA: Diagnosis not present

## 2018-11-02 DIAGNOSIS — Z20828 Contact with and (suspected) exposure to other viral communicable diseases: Secondary | ICD-10-CM | POA: Diagnosis present

## 2018-11-02 DIAGNOSIS — J189 Pneumonia, unspecified organism: Secondary | ICD-10-CM | POA: Diagnosis not present

## 2018-11-02 DIAGNOSIS — R000 Tachycardia, unspecified: Secondary | ICD-10-CM | POA: Diagnosis not present

## 2018-11-02 NOTE — ED Triage Notes (Signed)
Pt from home c/o left flank or rib area pain that increases with cough states has been a smoker since age 73 and has a chronic cough denies urinary discomfort

## 2018-11-03 ENCOUNTER — Inpatient Hospital Stay (HOSPITAL_COMMUNITY)
Admission: EM | Admit: 2018-11-03 | Discharge: 2018-11-05 | DRG: 193 | Disposition: A | Discharge status: Home / Self Care | Payer: Medicare Other | Attending: Internal Medicine | Admitting: Internal Medicine

## 2018-11-03 ENCOUNTER — Encounter (HOSPITAL_COMMUNITY): Payer: Self-pay

## 2018-11-03 ENCOUNTER — Emergency Department (HOSPITAL_COMMUNITY): Payer: Medicare Other

## 2018-11-03 DIAGNOSIS — I700 Atherosclerosis of aorta: Secondary | ICD-10-CM | POA: Diagnosis not present

## 2018-11-03 DIAGNOSIS — M1990 Unspecified osteoarthritis, unspecified site: Secondary | ICD-10-CM | POA: Diagnosis present

## 2018-11-03 DIAGNOSIS — J181 Lobar pneumonia, unspecified organism: Secondary | ICD-10-CM | POA: Diagnosis not present

## 2018-11-03 DIAGNOSIS — Z20828 Contact with and (suspected) exposure to other viral communicable diseases: Secondary | ICD-10-CM | POA: Diagnosis present

## 2018-11-03 DIAGNOSIS — R05 Cough: Secondary | ICD-10-CM | POA: Diagnosis not present

## 2018-11-03 DIAGNOSIS — J441 Chronic obstructive pulmonary disease with (acute) exacerbation: Secondary | ICD-10-CM | POA: Diagnosis not present

## 2018-11-03 DIAGNOSIS — J180 Bronchopneumonia, unspecified organism: Secondary | ICD-10-CM

## 2018-11-03 DIAGNOSIS — R079 Chest pain, unspecified: Secondary | ICD-10-CM | POA: Diagnosis not present

## 2018-11-03 DIAGNOSIS — R0902 Hypoxemia: Secondary | ICD-10-CM

## 2018-11-03 DIAGNOSIS — J189 Pneumonia, unspecified organism: Secondary | ICD-10-CM | POA: Diagnosis present

## 2018-11-03 DIAGNOSIS — J440 Chronic obstructive pulmonary disease with acute lower respiratory infection: Secondary | ICD-10-CM | POA: Diagnosis present

## 2018-11-03 DIAGNOSIS — J9601 Acute respiratory failure with hypoxia: Secondary | ICD-10-CM | POA: Diagnosis not present

## 2018-11-03 DIAGNOSIS — R0781 Pleurodynia: Secondary | ICD-10-CM

## 2018-11-03 DIAGNOSIS — F17210 Nicotine dependence, cigarettes, uncomplicated: Secondary | ICD-10-CM | POA: Diagnosis present

## 2018-11-03 DIAGNOSIS — Z9981 Dependence on supplemental oxygen: Secondary | ICD-10-CM | POA: Diagnosis not present

## 2018-11-03 DIAGNOSIS — R000 Tachycardia, unspecified: Secondary | ICD-10-CM | POA: Diagnosis not present

## 2018-11-03 DIAGNOSIS — Z8249 Family history of ischemic heart disease and other diseases of the circulatory system: Secondary | ICD-10-CM | POA: Diagnosis not present

## 2018-11-03 DIAGNOSIS — Z833 Family history of diabetes mellitus: Secondary | ICD-10-CM | POA: Diagnosis not present

## 2018-11-03 LAB — BASIC METABOLIC PANEL
Anion gap: 10 (ref 5–15)
BUN: 7 mg/dL — ABNORMAL LOW (ref 8–23)
CO2: 28 mmol/L (ref 22–32)
Calcium: 8.8 mg/dL — ABNORMAL LOW (ref 8.9–10.3)
Chloride: 92 mmol/L — ABNORMAL LOW (ref 98–111)
Creatinine, Ser: 0.53 mg/dL (ref 0.44–1.00)
GFR calc Af Amer: >60 mL/min (ref >60)
GFR calc non Af Amer: >60 mL/min (ref >60)
Glucose, Bld: 129 mg/dL — ABNORMAL HIGH (ref 70–99)
Potassium: 4 mmol/L (ref 3.5–5.1)
Sodium: 130 mmol/L — ABNORMAL LOW (ref 135–145)

## 2018-11-03 LAB — CBC WITH DIFFERENTIAL/PLATELET
Abs Immature Granulocytes: 0.15 10*3/uL — ABNORMAL HIGH (ref 0.00–0.07)
Basophils Absolute: 0 10*3/uL (ref 0.0–0.1)
Basophils Relative: 0 %
Eosinophils Absolute: 0 10*3/uL (ref 0.0–0.5)
Eosinophils Relative: 0 %
HCT: 41 % (ref 36.0–46.0)
Hemoglobin: 13.7 g/dL (ref 12.0–15.0)
Immature Granulocytes: 1 %
Lymphocytes Relative: 6 %
Lymphs Abs: 0.6 10*3/uL — ABNORMAL LOW (ref 0.7–4.0)
MCH: 32.2 pg (ref 26.0–34.0)
MCHC: 33.4 g/dL (ref 30.0–36.0)
MCV: 96.5 fL (ref 80.0–100.0)
Monocytes Absolute: 0.7 10*3/uL (ref 0.1–1.0)
Monocytes Relative: 6 %
Neutro Abs: 9.8 10*3/uL — ABNORMAL HIGH (ref 1.7–7.7)
Neutrophils Relative %: 87 %
Platelets: 218 10*3/uL (ref 150–400)
RBC: 4.25 MIL/uL (ref 3.87–5.11)
RDW: 12.3 % (ref 11.5–15.5)
WBC: 11.3 10*3/uL — ABNORMAL HIGH (ref 4.0–10.5)
nRBC: 0 % (ref 0.0–0.2)

## 2018-11-03 LAB — SARS CORONAVIRUS 2 (TAT 6-24 HRS): SARS Coronavirus 2: NEGATIVE

## 2018-11-03 LAB — D-DIMER, QUANTITATIVE: D-Dimer, Quant: 1.81 ug/mL-FEU — ABNORMAL HIGH (ref 0.00–0.50)

## 2018-11-03 MED ORDER — ALBUTEROL SULFATE (2.5 MG/3ML) 0.083% IN NEBU: 2.5000 mg | INHALATION_SOLUTION | Freq: Four times a day (QID) | RESPIRATORY_TRACT | Status: DC

## 2018-11-03 MED ORDER — ONDANSETRON HCL 4 MG PO TABS: 4.0000 mg | ORAL_TABLET | Freq: Four times a day (QID) | ORAL | Status: DC | PRN

## 2018-11-03 MED ORDER — ONDANSETRON HCL 4 MG/2ML IJ SOLN: 4.0000 mg | Freq: Four times a day (QID) | INTRAMUSCULAR | Status: DC | PRN

## 2018-11-03 MED ORDER — SODIUM CHLORIDE 0.9 % IV SOLN
500.0000 mg | Freq: Once | INTRAVENOUS | Status: AC
  Administered 2018-11-03: 500 mg via INTRAVENOUS
  Filled 2018-11-03: qty 500

## 2018-11-03 MED ORDER — SODIUM CHLORIDE 0.9 % IV SOLN
500.0000 mg | INTRAVENOUS | Status: DC
  Administered 2018-11-04 – 2018-11-05 (×2): 500 mg via INTRAVENOUS
  Filled 2018-11-03 (×2): qty 500

## 2018-11-03 MED ORDER — SODIUM CHLORIDE 0.9 % IV SOLN
1.0000 g | Freq: Once | INTRAVENOUS | Status: AC
  Administered 2018-11-03: 1 g via INTRAVENOUS
  Filled 2018-11-03: qty 10

## 2018-11-03 MED ORDER — GUAIFENESIN ER 600 MG PO TB12
600.0000 mg | ORAL_TABLET | Freq: Two times a day (BID) | ORAL | Status: DC
  Administered 2018-11-03 – 2018-11-05 (×5): 600 mg via ORAL
  Filled 2018-11-03 (×5): qty 1

## 2018-11-03 MED ORDER — IPRATROPIUM-ALBUTEROL 0.5-2.5 (3) MG/3ML IN SOLN
3.0000 mL | Freq: Four times a day (QID) | RESPIRATORY_TRACT | Status: DC
  Administered 2018-11-03: 3 mL via RESPIRATORY_TRACT
  Filled 2018-11-03: qty 3

## 2018-11-03 MED ORDER — SODIUM CHLORIDE 0.9 % IV SOLN
1.0000 g | INTRAVENOUS | Status: DC
  Administered 2018-11-04 – 2018-11-05 (×2): 1 g via INTRAVENOUS
  Filled 2018-11-03 (×2): qty 1

## 2018-11-03 MED ORDER — ALBUTEROL SULFATE HFA 108 (90 BASE) MCG/ACT IN AERS
8.0000 | INHALATION_SPRAY | Freq: Once | RESPIRATORY_TRACT | Status: AC
  Administered 2018-11-03: 02:00:00 8 via RESPIRATORY_TRACT
  Filled 2018-11-03: qty 6.7

## 2018-11-03 MED ORDER — SODIUM CHLORIDE (PF) 0.9 % IJ SOLN
INTRAMUSCULAR | Status: AC
  Filled 2018-11-03: qty 50

## 2018-11-03 MED ORDER — IOHEXOL 350 MG/ML SOLN
100.0000 mL | Freq: Once | INTRAVENOUS | Status: AC | PRN
  Administered 2018-11-03: 04:00:00 80 mL via INTRAVENOUS

## 2018-11-03 MED ORDER — IPRATROPIUM BROMIDE 0.02 % IN SOLN: 0.5000 mg | Freq: Four times a day (QID) | RESPIRATORY_TRACT | Status: DC

## 2018-11-03 MED ORDER — ENOXAPARIN SODIUM 40 MG/0.4ML ~~LOC~~ SOLN
40.0000 mg | SUBCUTANEOUS | Status: DC
  Filled 2018-11-03 (×2): qty 0.4

## 2018-11-03 MED ORDER — IPRATROPIUM-ALBUTEROL 0.5-2.5 (3) MG/3ML IN SOLN
3.0000 mL | Freq: Two times a day (BID) | RESPIRATORY_TRACT | Status: DC
  Administered 2018-11-04 – 2018-11-05 (×3): 3 mL via RESPIRATORY_TRACT
  Filled 2018-11-03 (×3): qty 3

## 2018-11-03 NOTE — ED Notes (Signed)
EDP at bedside  

## 2018-11-03 NOTE — ED Notes (Signed)
Pt finishing dinner then will be transported by Davy Pique, Therapist, sports.

## 2018-11-03 NOTE — ED Notes (Signed)
Pt transported to CT ?

## 2018-11-03 NOTE — ED Notes (Signed)
Pt provided with 2 warm blankets.  

## 2018-11-03 NOTE — H&P (Addendum)
TRH H&P    Patient Demographics:    Kelly Snyder, is a 73 y.o. female  MRN: [id number]  DOB - [birth year]  Admit Date - 11/03/2018  Referring MD/NP/PA: Dr. Florina Ou  Outpatient Primary MD for the patient is Autry-Lott, Naaman Plummer, DO  Patient coming from: Home  Chief complaint-left-sided chest pain   HPI:    Kelly Snyder  is a 73 y.o. female, with history of COPD, not on bronchodilator therapy who has been sick for past 1 week.  Patient said that she had nasal congestion, postnasal drip, cough and shortness of breath.  She came with left lower chest wall pain.  Patient was hypoxic on arrival, oxygen saturation improved after she was put on oxygen by nasal cannula.  CT chest was obtained which was negative for pulmonary embolism but showed bilateral bronchopneumonia.  Patient was put on Rocephin and Zithromax. Denies nausea vomiting or diarrhea. Denies any fever or chills. Denies abdominal pain or dysuria. Denies history of stroke or seizures No history of cancer.    Review of systems:    In addition to the HPI above,    All other systems reviewed and are negative.    Past History of the following :    Past Medical History:  Diagnosis Date  . Arthritis   . COPD (chronic obstructive pulmonary disease) ([place])       Past Surgical History:  Procedure Laterality Date  . DILATION AND CURETTAGE OF UTERUS  1970      Social History:      Social History   Tobacco Use  . Smoking status: Current Every Day Smoker    Packs/day: 0.50    Years: 49.00    Pack years: 24.50    Types: Cigarettes    Start date: 01/04/1966  . Smokeless tobacco: Never Used  . Tobacco comment: only smokes 1/2 ppd/used to smoke 1 1/2 ppd  Substance Use Topics  . Alcohol use: No       Family History :     Family History  Problem Relation Age of Onset  . Diabetes Mother   . Heart disease Mother   . Alcohol abuse Son    . Heart disease Sister       Home Medications:   Prior to Admission medications   Medication Sig Start Date End Date Taking? Authorizing Provider  acetaminophen (TYLENOL) 500 MG tablet Take 1,000 mg by mouth every 6 (six) hours as needed for moderate pain (pain).    Yes [provider]  Calcium Carb-Cholecalciferol (CALCIUM 600-D PO) Take 1 tablet by mouth daily.   Yes [provider]  Multiple Vitamin (MULTIVITAMIN WITH MINERALS) TABS tablet Take 1 tablet by mouth daily.   Yes [provider]  HYDROcodone-acetaminophen (NORCO/VICODIN) 5-325 MG tablet Take 2 tablets by mouth every 4 (four) hours as needed. Patient not taking: Reported on 11/03/2018 02/07/17   Pixie Casino, MD  polyethylene glycol [facility]) packet Take 17 g by mouth daily. Patient not taking: Reported on 11/03/2018 02/07/17   Mabe, Forbes Cellar, MD  Allergies:    No Known Allergies   Physical Exam:   Vitals  Blood pressure (!) 127/58, pulse 96, temperature 98.1 F (36.7 C), temperature source Oral, resp. rate 16, height  (1.6 m), weight 49.9 kg, SpO2 93 %.  1.  General: Appears in no acute distress  2. Psychiatric: Alert , Oriented x3, intact insight and judgment  3. Neurologic: Cranial nerves II through XII grossly intact, no focal deficit noted  4. HEENMT:  Atraumatic normocephalic, extraocular muscles are intact  5. Respiratory : Clear to auscultation bilaterally, no wheezing or crackles auscultated  6. Cardiovascular : S1-S2, regular, no murmur auscultated  7. Gastrointestinal:  Abdominal soft, nontender, no organomegaly      Data Review:    CBC Recent Labs  Lab 11/03/18 0126  WBC 11.3*  HGB 13.7  HCT 41.0  PLT 218  MCV 96.5  MCH 32.2  MCHC 33.4  RDW 12.3  LYMPHSABS 0.6*  MONOABS 0.7  EOSABS 0.0  BASOSABS 0.0   ------------------------------------------------------------------------------------------------------------------  Results for orders  placed or performed during the hospital encounter of 11/03/18 (from the past 48 hour(s))  CBC with Differential/Platelet     Status: Abnormal   Collection Time: 11/03/18  1:26 AM  Result Value Ref Range   WBC 11.3 (H) 4.0 - 10.5 K/uL   RBC 4.25 3.87 - 5.11 MIL/uL   Hemoglobin 13.7 12.0 - 15.0 g/dL   HCT 45.4 09.8 - 11.9 %   MCV 96.5 80.0 - 100.0 fL   MCH 32.2 26.0 - 34.0 pg   MCHC 33.4 30.0 - 36.0 g/dL   RDW 14.7 82.9 - 56.2 %   Platelets 218 150 - 400 K/uL   nRBC 0.0 0.0 - 0.2 %   Neutrophils Relative % 87 %   Neutro Abs 9.8 (H) 1.7 - 7.7 K/uL   Lymphocytes Relative 6 %   Lymphs Abs 0.6 (L) 0.7 - 4.0 K/uL   Monocytes Relative 6 %   Monocytes Absolute 0.7 0.1 - 1.0 K/uL   Eosinophils Relative 0 %   Eosinophils Absolute 0.0 0.0 - 0.5 K/uL   Basophils Relative 0 %   Basophils Absolute 0.0 0.0 - 0.1 K/uL   Immature Granulocytes 1 %   Abs Immature Granulocytes 0.15 (H) 0.00 - 0.07 K/uL    Comment: Performed at [facility], [address], [place], [place] [address]  Basic metabolic panel     Status: Abnormal   Collection Time: 11/03/18  1:26 AM  Result Value Ref Range   Sodium 130 (L) 135 - 145 mmol/L   Potassium 4.0 3.5 - 5.1 mmol/L   Chloride 92 (L) 98 - 111 mmol/L   CO2 28 22 - 32 mmol/L   Glucose, Bld 129 (H) 70 - 99 mg/dL   BUN 7 (L) 8 - 23 mg/dL   Creatinine, Ser 5.78 0.44 - 1.00 mg/dL   Calcium 8.8 (L) 8.9 - 10.3 mg/dL   GFR calc non Af Amer >60 >60 mL/min   GFR calc Af Amer >60 >60 mL/min   Anion gap 10 5 - 15    Comment: Performed at [facility], [address] [place], [place] [address]  D-dimer, quantitative (not at [facility])     Status: Abnormal   Collection Time: 11/03/18  1:26 AM  Result Value Ref Range   D-Dimer, Quant 1.81 (H) 0.00 - 0.50 ug/mL-FEU    Comment: (NOTE) At the manufacturer cut-off of 0.50 ug/mL FEU, this assay has been documented  to exclude PE with a sensitivity and negative predictive value of 97 to 99%.   At this time, this assay has not been approved by the FDA to exclude DVT/VTE. Results should be correlated with clinical presentation. Performed at [facility], [address] [place], [place] [address]     Chemistries  Recent Labs  Lab 11/03/18 0126  NA 130*  K 4.0  CL 92*  CO2 28  GLUCOSE 129*  BUN 7*  CREATININE 0.53  CALCIUM 8.8*    --------------------------------------------------------------------------------------------------------------- Urine analysis:    Component Value Date/Time   COLORURINE STRAW (A) 02/07/2017 1513   APPEARANCEUR CLEAR 02/07/2017 1513   LABSPEC 1.002 (L) 02/07/2017 1513   PHURINE 8.0 02/07/2017 1513   GLUCOSEU NEGATIVE 02/07/2017 1513   HGBUR NEGATIVE 02/07/2017 1513   BILIRUBINUR NEGATIVE 02/07/2017 1513   KETONESUR NEGATIVE 02/07/2017 1513   PROTEINUR NEGATIVE 02/07/2017 1513   NITRITE NEGATIVE 02/07/2017 1513   LEUKOCYTESUR NEGATIVE 02/07/2017 1513      Imaging Results:    Dg Chest 2 View  Result Date: 11/03/2018 CLINICAL DATA:  73 year old female with cough and shortness of breath. Pleuritic chest pain. EXAM: CHEST - 2 VIEW COMPARISON:  Chest radiograph dated 03/12/2018. FINDINGS: Emphysema. No focal consolidation, pleural effusion, pneumothorax. The cardiac silhouette is within normal limits. Atherosclerotic calcification of the aortic arch. No acute osseous pathology. Osteopenia. IMPRESSION: No active cardiopulmonary disease.  Emphysema. Electronically Signed   By: Elgie CollardArash  Radparvar M.D.   On: 11/03/2018 01:35   Ct Angio Chest Pe W And/or Wo Contrast  Result Date: 11/03/2018 CLINICAL DATA:  Left flank pain that increases with cough. Tachycardia. EXAM: CT ANGIOGRAPHY CHEST WITH CONTRAST TECHNIQUE: Multidetector CT imaging of the chest was performed using the standard protocol during bolus administration of intravenous contrast. Multiplanar CT image reconstructions and MIPs were obtained to evaluate the vascular  anatomy. CONTRAST:  80mL OMNIPAQUE IOHEXOL 350 MG/ML SOLN COMPARISON:  10/14/2015 lung cancer screening CT FINDINGS: Cardiovascular: Normal heart size. No pericardial effusion. Extensive aortic atherosclerotic calcification. Extensive atherosclerotic calcification of the proximal left subclavian with at least 75% luminal stenosis. No pulmonary artery filling defect Mediastinum/Nodes: Negative for mass or adenopathy Lungs/Pleura: COPD with hyperinflation and emphysema. Prominent airway thickening with mucoid impaction in the lower lobes with reticulonodular airspace disease at the lung bases. There is a trace left pleural effusion. Upper Abdomen: Negative Musculoskeletal: No acute or aggressive finding. Incomplete coverage of the L2 body shows a chronic appearing superior endplate fracture. Review of the MIP images confirms the above findings. IMPRESSION: 1. Bilateral bronchopneumonia. 2. Negative for pulmonary embolism. 3. Aortic Atherosclerosis (ICD10-I70.0) and Emphysema (ICD10-J43.9). 4. High-grade atheromatous narrowing of the proximal left subclavian Electronically Signed   By: Marnee SpringJonathon  Watts M.D.   On: 11/03/2018 04:22    My personal review of EKG: Rhythm NSR, tall peaked p waves   Assessment & Plan:    Active Problems:   Pneumonia   1. Acute hypoxic respiratory failure-secondary to bronchopneumonia-seen on CT chest, will obtain blood cultures x2.  SARS-CoV-2 is currently pending.  Patient started on ceftriaxone and Zithromax.  We will continue with antibiotics.  Follow blood culture results.  2. COPD exacerbation-mild, start DuoNebs every 6 hours.  Mucinex 1 tablet p.o. twice daily.    DVT Prophylaxis-   Lovenox   AM Labs Ordered, also please review Full Orders  Family Communication: Admission, patients condition and plan of care including tests being ordered have been discussed with the patient  who indicate understanding and  agree with the plan and Code Status.  Code Status: Full  code  Admission status: Inpatient: Based on patients clinical presentation and evaluation of above clinical data, I have made determination that patient meets Inpatient criteria at this time.  Time spent in minutes : 60 minutes   Meredeth Ide M.D on 11/03/2018 at 8:43 AM

## 2018-11-03 NOTE — ED Notes (Signed)
Patient requests to go to the floor after eating her food.

## 2018-11-03 NOTE — ED Provider Notes (Signed)
[facility]-EMERGENCY DEPT Provider Note: Lowella DellJ. Lane Jaydee Ingman, MD, FACEP  CSN: [id number] MRN: [id number] ARRIVAL: 11/02/18 at 2329 ROOM: WA09/WA09   CHIEF COMPLAINT  Chest Pain   HISTORY OF PRESENT ILLNESS  11/03/18 1:05 AM Kelly Snyder is a 73 y.o. female with a history of COPD not currently on any bronchodilator treatment.  She states she has been "sick with a cold" for the past week.  She has had nasal congestion, postnasal drip, cough (worse than her chronic smoker's cough) and shortness of breath.  The shortness of breath has not been severe.  She is here this morning because of left lower lateral chest wall pain.  The pain is a 4 out of 10 at rest but becomes a 10 out of 10 with coughing.  Should she describes the pain as sharp.  She was noted to be tachycardic and hypoxic on arrival.  She was placed on oxygen by nasal cannula with improvement to the high 90s.  She is not aware of having a fever.  She denies changes in taste or smell.  She denies nausea, vomiting or diarrhea.    Past Medical History:  Diagnosis Date  . Arthritis   . COPD (chronic obstructive pulmonary disease) ([facility])     Past Surgical History:  Procedure Laterality Date  . DILATION AND CURETTAGE OF UTERUS  1970    Family History  Problem Relation Age of Onset  . Diabetes Mother   . Heart disease Mother   . Alcohol abuse Son   . Heart disease Sister     Social History   Tobacco Use  . Smoking status: Current Every Day Smoker    Packs/day: 0.50    Years: 49.00    Pack years: 24.50    Types: Cigarettes    Start date: 01/04/1966  . Smokeless tobacco: Never Used  . Tobacco comment: only smokes 1/2 ppd/used to smoke 1 1/2 ppd  Substance Use Topics  . Alcohol use: No  . Drug use: No    Prior to Admission medications   Medication Sig Start Date End Date Taking? Authorizing Provider  acetaminophen (TYLENOL) 500 MG tablet Take 1,000 mg by mouth every 6 (six) hours as needed for moderate pain (pain).    Yes  [provider]  Calcium Carb-Cholecalciferol (CALCIUM 600-D PO) Take 1 tablet by mouth daily.   Yes [provider]  Multiple Vitamin (MULTIVITAMIN WITH MINERALS) TABS tablet Take 1 tablet by mouth daily.   Yes [provider]  HYDROcodone-acetaminophen (NORCO/VICODIN) 5-325 MG tablet Take 2 tablets by mouth every 4 (four) hours as needed. Patient not taking: Reported on 11/03/2018 02/07/17   Phillis HaggisMabe, Martha L, MD  polyethylene glycol [facility](MIRALAX) packet Take 17 g by mouth daily. Patient not taking: Reported on 11/03/2018 02/07/17   Mabe, Latanya MaudlinMartha L, MD    Allergies Patient has no known allergies.   REVIEW OF SYSTEMS  Negative except as noted here or in the History of Present Illness.   PHYSICAL EXAMINATION  Initial Vital Signs Blood pressure 120/87, pulse (!) 121, temperature 98.1 F (36.7 C), temperature source Oral, resp. rate 20, height 5\' 3"  (1.6 m), weight 49.9 kg, SpO2 (S) (!) 88 %.  Examination General: Well-developed, well-nourished female in no acute distress; appearance consistent with age of record HENT: normocephalic; atraumatic Eyes: pupils equal, round and reactive to light; extraocular muscles intact Neck: supple Heart: regular rate and rhythm; tachycardia Lungs: Inspiratory and expiratory wheezes, rales and rhonchi in bases, left greater than right Abdomen:  soft; nondistended; nontender; bowel sounds present Extremities: No deformity; full range of motion; pulses normal Neurologic: Awake, alert and oriented; motor function intact in all extremities and symmetric; no facial droop Skin: Warm and dry Psychiatric: Normal mood and affect   RESULTS  Summary of this visit's results, reviewed and interpreted by myself:   EKG Interpretation  Date/Time:  Friday November 03 2018 03:41:21 EDT Ventricular Rate:  96 PR Interval:  132 QRS Duration: 84 QT Interval:  342 QTC Calculation: 432 R Axis:   94 Text Interpretation: Normal sinus rhythm Biatrial  enlargement Rightward axis Pulmonary disease pattern Nonspecific ST abnormality Abnormal ECG No previous ECGs available Confirmed by Paula Libra ([id number]) on 11/03/2018 3:59:46 AM      Laboratory Studies: Results for orders placed or performed during the hospital encounter of 11/03/18 (from the past 24 hour(s))  CBC with Differential/Platelet     Status: Abnormal   Collection Time: 11/03/18  1:26 AM  Result Value Ref Range   WBC 11.3 (H) 4.0 - 10.5 K/uL   RBC 4.25 3.87 - 5.11 MIL/uL   Hemoglobin 13.7 12.0 - 15.0 g/dL   HCT 41.9 62.2 - 29.7 %   MCV 96.5 80.0 - 100.0 fL   MCH 32.2 26.0 - 34.0 pg   MCHC 33.4 30.0 - 36.0 g/dL   RDW 98.9 21.1 - 94.1 %   Platelets 218 150 - 400 K/uL   nRBC 0.0 0.0 - 0.2 %   Neutrophils Relative % 87 %   Neutro Abs 9.8 (H) 1.7 - 7.7 K/uL   Lymphocytes Relative 6 %   Lymphs Abs 0.6 (L) 0.7 - 4.0 K/uL   Monocytes Relative 6 %   Monocytes Absolute 0.7 0.1 - 1.0 K/uL   Eosinophils Relative 0 %   Eosinophils Absolute 0.0 0.0 - 0.5 K/uL   Basophils Relative 0 %   Basophils Absolute 0.0 0.0 - 0.1 K/uL   Immature Granulocytes 1 %   Abs Immature Granulocytes 0.15 (H) 0.00 - 0.07 K/uL  Basic metabolic panel     Status: Abnormal   Collection Time: 11/03/18  1:26 AM  Result Value Ref Range   Sodium 130 (L) 135 - 145 mmol/L   Potassium 4.0 3.5 - 5.1 mmol/L   Chloride 92 (L) 98 - 111 mmol/L   CO2 28 22 - 32 mmol/L   Glucose, Bld 129 (H) 70 - 99 mg/dL   BUN 7 (L) 8 - 23 mg/dL   Creatinine, Ser 7.40 0.44 - 1.00 mg/dL   Calcium 8.8 (L) 8.9 - 10.3 mg/dL   GFR calc non Af Amer >60 >60 mL/min   GFR calc Af Amer >60 >60 mL/min   Anion gap 10 5 - 15  D-dimer, quantitative (not at [facility])     Status: Abnormal   Collection Time: 11/03/18  1:26 AM  Result Value Ref Range   D-Dimer, Quant 1.81 (H) 0.00 - 0.50 ug/mL-FEU   Imaging Studies: Dg Chest 2 View  Result Date: 11/03/2018 CLINICAL DATA:  73 year old female with cough and shortness of breath. Pleuritic chest  pain. EXAM: CHEST - 2 VIEW COMPARISON:  Chest radiograph dated 03/12/2018. FINDINGS: Emphysema. No focal consolidation, pleural effusion, pneumothorax. The cardiac silhouette is within normal limits. Atherosclerotic calcification of the aortic arch. No acute osseous pathology. Osteopenia. IMPRESSION: No active cardiopulmonary disease.  Emphysema. Electronically Signed   By: Elgie Collard M.D.   On: 11/03/2018 01:35   Ct Angio Chest Pe W And/or Wo Contrast  Result Date: 11/03/2018 CLINICAL  DATA:  Left flank pain that increases with cough. Tachycardia. EXAM: CT ANGIOGRAPHY CHEST WITH CONTRAST TECHNIQUE: Multidetector CT imaging of the chest was performed using the standard protocol during bolus administration of intravenous contrast. Multiplanar CT image reconstructions and MIPs were obtained to evaluate the vascular anatomy. CONTRAST:  63mL OMNIPAQUE IOHEXOL 350 MG/ML SOLN COMPARISON:  10/14/2015 lung cancer screening CT FINDINGS: Cardiovascular: Normal heart size. No pericardial effusion. Extensive aortic atherosclerotic calcification. Extensive atherosclerotic calcification of the proximal left subclavian with at least 75% luminal stenosis. No pulmonary artery filling defect Mediastinum/Nodes: Negative for mass or adenopathy Lungs/Pleura: COPD with hyperinflation and emphysema. Prominent airway thickening with mucoid impaction in the lower lobes with reticulonodular airspace disease at the lung bases. There is a trace left pleural effusion. Upper Abdomen: Negative Musculoskeletal: No acute or aggressive finding. Incomplete coverage of the L2 body shows a chronic appearing superior endplate fracture. Review of the MIP images confirms the above findings. IMPRESSION: 1. Bilateral bronchopneumonia. 2. Negative for pulmonary embolism. 3. Aortic Atherosclerosis (ICD10-I70.0) and Emphysema (ICD10-J43.9). 4. High-grade atheromatous narrowing of the proximal left subclavian Electronically Signed   By: Monte Fantasia M.D.   On: 11/03/2018 04:22    ED COURSE and MDM  Nursing notes, initial and subsequent vitals signs, including pulse oximetry, reviewed and interpreted by myself.  Vitals:   11/03/18 0200 11/03/18 0230 11/03/18 0300 11/03/18 0345  BP: (!) 128/57 (!) 119/55 (!) 110/48 (!) 124/56  Pulse: 92 99 81 97  Resp: (!) 21 20 18 16   Temp:      TempSrc:      SpO2: 94% 94% 92% 94%  Weight:      Height:       Medications  sodium chloride (PF) 0.9 % injection (has no administration in time range)  cefTRIAXone (ROCEPHIN) 1 g in sodium chloride 0.9 % 100 mL IVPB (has no administration in time range)  azithromycin (ZITHROMAX) 500 mg in sodium chloride 0.9 % 250 mL IVPB (has no administration in time range)  albuterol (VENTOLIN HFA) 108 (90 Base) MCG/ACT inhaler 8 puff (8 puffs Inhalation Given 11/03/18 0135)  iohexol (OMNIPAQUE) 350 MG/ML injection 100 mL (80 mLs Intravenous Contrast Given 11/03/18 0351)   5:11 AM Rocephin and Zithromax ordered for bilateral bronchopneumonia.  Patient's air movement has improved with albuterol treatment.  Given that she was hypoxic on arrival with no home treatment for her COPD we will have her admitted.  PROCEDURES  Procedures   ED DIAGNOSES     ICD-10-CM   1. Acute bronchopneumonia  J18.0   2. COPD with acute exacerbation ([place])  J44.1   3. Pleuritic chest pain  R07.81        Shanon Rosser, MD 11/03/18 [phone]

## 2018-11-03 NOTE — ED Notes (Signed)
Provided pt with meal tray and coffee at this time.

## 2018-11-04 DIAGNOSIS — J181 Lobar pneumonia, unspecified organism: Secondary | ICD-10-CM | POA: Diagnosis not present

## 2018-11-04 DIAGNOSIS — I700 Atherosclerosis of aorta: Secondary | ICD-10-CM | POA: Diagnosis not present

## 2018-11-04 DIAGNOSIS — J9601 Acute respiratory failure with hypoxia: Secondary | ICD-10-CM | POA: Diagnosis not present

## 2018-11-04 LAB — COMPREHENSIVE METABOLIC PANEL
ALT: 18 U/L (ref 0–44)
AST: 19 U/L (ref 15–41)
Albumin: 3.4 g/dL — ABNORMAL LOW (ref 3.5–5.0)
Alkaline Phosphatase: 96 U/L (ref 38–126)
Anion gap: 11 (ref 5–15)
BUN: 8 mg/dL (ref 8–23)
CO2: 29 mmol/L (ref 22–32)
Calcium: 8.6 mg/dL — ABNORMAL LOW (ref 8.9–10.3)
Chloride: 95 mmol/L — ABNORMAL LOW (ref 98–111)
Creatinine, Ser: 0.5 mg/dL (ref 0.44–1.00)
GFR calc Af Amer: >60 mL/min (ref >60)
GFR calc non Af Amer: >60 mL/min (ref >60)
Glucose, Bld: 105 mg/dL — ABNORMAL HIGH (ref 70–99)
Potassium: 3.7 mmol/L (ref 3.5–5.1)
Sodium: 135 mmol/L (ref 135–145)
Total Bilirubin: 0.8 mg/dL (ref 0.3–1.2)
Total Protein: 6.5 g/dL (ref 6.5–8.1)

## 2018-11-04 LAB — CBC
HCT: 37.2 % (ref 36.0–46.0)
Hemoglobin: 12 g/dL (ref 12.0–15.0)
MCH: 31.9 pg (ref 26.0–34.0)
MCHC: 32.3 g/dL (ref 30.0–36.0)
MCV: 98.9 fL (ref 80.0–100.0)
Platelets: 215 10*3/uL (ref 150–400)
RBC: 3.76 MIL/uL — ABNORMAL LOW (ref 3.87–5.11)
RDW: 12.6 % (ref 11.5–15.5)
WBC: 9.5 10*3/uL (ref 4.0–10.5)
nRBC: 0 % (ref 0.0–0.2)

## 2018-11-04 MED ORDER — ACETAMINOPHEN 325 MG PO TABS
650.0000 mg | ORAL_TABLET | Freq: Four times a day (QID) | ORAL | Status: DC | PRN
  Administered 2018-11-04 (×2): 650 mg via ORAL
  Filled 2018-11-04 (×2): qty 2

## 2018-11-04 MED ORDER — ALBUTEROL SULFATE (2.5 MG/3ML) 0.083% IN NEBU: 3.0000 mL | INHALATION_SOLUTION | RESPIRATORY_TRACT | Status: DC | PRN

## 2018-11-04 MED ORDER — PREDNISONE 20 MG PO TABS
40.0000 mg | ORAL_TABLET | Freq: Every day | ORAL | Status: DC
  Administered 2018-11-05: 40 mg via ORAL
  Filled 2018-11-04 (×2): qty 2

## 2018-11-04 MED ORDER — GUAIFENESIN 100 MG/5ML PO SOLN: 5.0000 mL | ORAL | Status: DC | PRN

## 2018-11-04 NOTE — Progress Notes (Signed)
  PROGRESS NOTE  Kelly Snyder HYI:[id number] DOB: 10/23/1945 DOA: 11/03/2018 PCP: Gerlene Fee, DO  Brief History   26yow PMH COPD not on tx presented with cough, SOB, left chest wall pain. Admitted for acute hypoxic resp failure, pneumonia.   A & P  Acute hypoxic resp failure secondary to bronchopneumonia (lobar) on chest CT --Slowly improving but not back to baseline.  Wean oxygen as tolerated. --Continue antibiotics  COPD exacerbation, emphysema  --Slowly improving.  Steroids, bronchodilators.  High-grade atheromatous narrowing of the proximal left subclavian --f/u with [facility] as an outpatient   Aortic atherosclerosis  --f/u as an outpatient  DVT prophylaxis: enoxaparin Code Status: Full Family Communication: none Disposition Plan: home    Murray Hodgkins, MD  [facility] Direct contact: see [email or web address] (further directions at bottom of note if needed) 7PM-7AM contact night coverage as at bottom of note 11/04/2018, 1:17 PM  LOS: 1 day   Significant Hospital Events   . Admit 10/30 for pneumonia, COPD exacerbation   Consults:  .    Procedures:  .   Significant Diagnostic Tests:  . COVID negative . CTA chest no PE. Bilateral bronchopenumonia    Micro Data:  . 10/30 BC >    Antimicrobials:  . Ceftriaxone 10/30 . Zithromax 10/30  Interval History/Subjective  Feels better but not back to baseline.  Still short of breath.  Coughing.  Objective   Vitals:  Vitals:   11/04/18 0500 11/04/18 0832  BP: (!) 126/57   Pulse: 96   Resp: 14   Temp: 98.7 F (37.1 C)   SpO2: 97% 95%    Exam:  Constitutional:  . Appears calm and comfortable ENMT:  . grossly normal hearing  Respiratory:  . Some coarse breath sounds with crackles on the right.  Fair air movement.  Mild increased respiratory effort. Cardiovascular:  . RRR, no m/r/g . No LE extremity edema   Psychiatric:  . Mental status o Mood, affect appropriate  I have personally reviewed the  following:   Today's Data  . CMP unremarkable . CBC unremarkable  Scheduled Meds: . enoxaparin (LOVENOX) injection  40 mg Subcutaneous Q24H  . guaiFENesin  600 mg Oral BID  . ipratropium-albuterol  3 mL Nebulization BID  . predniSONE  40 mg Oral Q breakfast   Continuous Infusions: . azithromycin 500 mg (11/04/18 0510)  . cefTRIAXone (ROCEPHIN)  IV 1 g (11/04/18 0406)    Principal Problem:   Lobar pneumonia ([place]) Active Problems:   Acute respiratory failure with hypoxia ([place])   Aortic atherosclerosis ([place])   LOS: 1 day   How to contact the [facility] Attending or Consulting provider [place] or covering provider during after hours [place], for this patient?  1. Check the care team in [facility] and look for a) attending/consulting [facility] provider listed and b) the [facility] team listed 2. Log into [email or web address] and use [place] universal password to access. If you do not have the password, please contact the hospital operator. 3. Locate the [facility] provider you are looking for under [facility] and page to a number that you can be directly reached. 4. If you still have difficulty reaching the provider, please page the [facility] (Director on Call) for the Hospitalists listed on amion for assistance.

## 2018-11-05 MED ORDER — PREDNISONE 20 MG PO TABS: 20.0000 mg | ORAL_TABLET | Freq: Every day | ORAL | 0 refills | Status: AC

## 2018-11-05 MED ORDER — PREDNISONE 20 MG PO TABS: 20.0000 mg | ORAL_TABLET | Freq: Every day | ORAL | 0 refills | Status: DC

## 2018-11-05 MED ORDER — CEFUROXIME AXETIL 500 MG PO TABS: 500.0000 mg | ORAL_TABLET | Freq: Two times a day (BID) | ORAL | 0 refills | Status: AC

## 2018-11-05 MED ORDER — AZITHROMYCIN 250 MG PO TABS: 500.0000 mg | ORAL_TABLET | Freq: Every day | ORAL | Status: DC

## 2018-11-05 MED ORDER — GUAIFENESIN 100 MG/5ML PO SOLN: 5.0000 mL | ORAL | 0 refills | Status: DC | PRN

## 2018-11-05 MED ORDER — AZITHROMYCIN 250 MG PO TABS: 500.0000 mg | ORAL_TABLET | Freq: Every day | ORAL | 0 refills | Status: DC

## 2018-11-05 MED ORDER — ALBUTEROL SULFATE HFA 108 (90 BASE) MCG/ACT IN AERS: 2.0000 | INHALATION_SPRAY | Freq: Four times a day (QID) | RESPIRATORY_TRACT | 1 refills | Status: DC | PRN

## 2018-11-05 MED ORDER — SPIRIVA HANDIHALER 18 MCG IN CAPS: 18.0000 ug | ORAL_CAPSULE | Freq: Every day | RESPIRATORY_TRACT | 2 refills | Status: DC

## 2018-11-05 MED ORDER — AZITHROMYCIN 250 MG PO TABS: 500.0000 mg | ORAL_TABLET | Freq: Every day | ORAL | 0 refills | Status: AC

## 2018-11-05 NOTE — Discharge Summary (Signed)
Discharge Summary  Kelly Snyder:[id number] DOB: 07/14/1945  PCP: Gerlene Fee, DO  Admit date: 11/03/2018 Discharge date: 11/05/2018   Recommendations for Outpatient Follow-up:  1. PCP in 2 weeks   Discharge Diagnoses:  Active Hospital Problems   Diagnosis Date Noted  . Lobar pneumonia ([place]) 11/04/2018  . Acute respiratory failure with hypoxia ([place]) 11/04/2018  . Aortic atherosclerosis ([place]) 11/04/2018    Resolved Hospital Problems   Diagnosis Date Noted Date Resolved  . Pneumonia 11/03/2018 11/04/2018    Discharge Condition: Stable   Diet recommendation: Resume regular diet   Vitals:   11/05/18 1119 11/05/18 1157  BP:    Pulse:    Resp:    Temp:    SpO2: 97% 90%    HPI and Brief Hospital Course:  35yow PMH COPD not on tx presented with cough, SOB, left chest wall pain. Admitted for acute hypoxic resp failure, pneumonia seen on CT chest. She was treated with IV abx, breathing treatments and steroids. Done well, today ambulated on room air no wheezing, no respiratory distress and has nonproductive cough. She is ready for DC home today on oral abx to complete total 5 day course and 3 more days of PO prednisone.   Discharge details, plan of care and follow up instructions were discussed with patient and any available family or care providers. Patient and family are in agreement with discharge from the hospital today and all questions were answered to their satisfaction.  Procedures:  None   Consultations:  None   Discharge Exam: BP (!) 131/52 (BP Location: Right Arm)   Pulse 85   Temp 97.9 F (36.6 C) (Oral)   Resp 14   Ht 5\' 3"  (1.6 m)   Wt 49.9 kg   SpO2 90%   BMI 19.49 kg/m  See progress note  Discharge Instructions You were cared for by a hospitalist during your hospital stay. If you have any questions about your discharge medications or the care you received while you were in the hospital after you are discharged, you can call the unit and  asked to speak with the hospitalist on call if the hospitalist that took care of you is not available. Once you are discharged, your primary care physician will handle any further medical issues. Please note that NO REFILLS for any discharge medications will be authorized once you are discharged, as it is imperative that you return to your primary care physician (or establish a relationship with a primary care physician if you do not have one) for your aftercare needs so that they can reassess your need for medications and monitor your lab values.  Discharge Instructions    Diet - low sodium heart healthy   Complete by: As directed    Increase activity slowly   Complete by: As directed      Allergies as of 11/05/2018   No Known Allergies     Medication List    STOP taking these medications   HYDROcodone-acetaminophen 5-325 MG tablet Commonly known as: NORCO/VICODIN   polyethylene glycol 17 g packet Commonly known as: MiraLax     TAKE these medications   acetaminophen 500 MG tablet Commonly known as: TYLENOL Take 1,000 mg by mouth every 6 (six) hours as needed for moderate pain (pain).   albuterol 108 (90 Base) MCG/ACT inhaler Commonly known as: VENTOLIN HFA Inhale 2 puffs into the lungs every 6 (six) hours as needed for wheezing or shortness of breath.   azithromycin 250 MG tablet Commonly  known as: ZITHROMAX Take 2 tablets (500 mg total) by mouth daily for 2 days.   CALCIUM 600-D PO Take 1 tablet by mouth daily.   cefUROXime 500 MG tablet Commonly known as: CEFTIN Take 1 tablet (500 mg total) by mouth 2 (two) times daily for 2 days.   guaiFENesin 100 MG/5ML Soln Commonly known as: ROBITUSSIN Take 5 mLs (100 mg total) by mouth every 4 (four) hours as needed for cough or to loosen phlegm.   multivitamin with minerals Tabs tablet Take 1 tablet by mouth daily.   predniSONE 20 MG tablet Commonly known as: DELTASONE Take 1 tablet (20 mg total) by mouth daily with  breakfast for 4 days. Start taking on: November 06, 2018   Spiriva HandiHaler 18 MCG inhalation capsule Generic drug: tiotropium Place 1 capsule (18 mcg total) into inhaler and inhale daily.      No Known Allergies    The results of significant diagnostics from this hospitalization (including imaging, microbiology, ancillary and laboratory) are listed below for reference.    Significant Diagnostic Studies: Dg Chest 2 View  Result Date: 11/03/2018 CLINICAL DATA:  73 year old female with cough and shortness of breath. Pleuritic chest pain. EXAM: CHEST - 2 VIEW COMPARISON:  Chest radiograph dated 03/12/2018. FINDINGS: Emphysema. No focal consolidation, pleural effusion, pneumothorax. The cardiac silhouette is within normal limits. Atherosclerotic calcification of the aortic arch. No acute osseous pathology. Osteopenia. IMPRESSION: No active cardiopulmonary disease.  Emphysema. Electronically Signed   By: Elgie Collard M.D.   On: 11/03/2018 01:35   Ct Angio Chest Pe W And/or Wo Contrast  Result Date: 11/03/2018 CLINICAL DATA:  Left flank pain that increases with cough. Tachycardia. EXAM: CT ANGIOGRAPHY CHEST WITH CONTRAST TECHNIQUE: Multidetector CT imaging of the chest was performed using the standard protocol during bolus administration of intravenous contrast. Multiplanar CT image reconstructions and MIPs were obtained to evaluate the vascular anatomy. CONTRAST:  80mL OMNIPAQUE IOHEXOL 350 MG/ML SOLN COMPARISON:  10/14/2015 lung cancer screening CT FINDINGS: Cardiovascular: Normal heart size. No pericardial effusion. Extensive aortic atherosclerotic calcification. Extensive atherosclerotic calcification of the proximal left subclavian with at least 75% luminal stenosis. No pulmonary artery filling defect Mediastinum/Nodes: Negative for mass or adenopathy Lungs/Pleura: COPD with hyperinflation and emphysema. Prominent airway thickening with mucoid impaction in the lower lobes with  reticulonodular airspace disease at the lung bases. There is a trace left pleural effusion. Upper Abdomen: Negative Musculoskeletal: No acute or aggressive finding. Incomplete coverage of the L2 body shows a chronic appearing superior endplate fracture. Review of the MIP images confirms the above findings. IMPRESSION: 1. Bilateral bronchopneumonia. 2. Negative for pulmonary embolism. 3. Aortic Atherosclerosis (ICD10-I70.0) and Emphysema (ICD10-J43.9). 4. High-grade atheromatous narrowing of the proximal left subclavian Electronically Signed   By: Marnee Spring M.D.   On: 11/03/2018 04:22    Microbiology: Recent Results (from the past 240 hour(s))  SARS CORONAVIRUS 2 (TAT 6-24 HRS) Nasopharyngeal Nasopharyngeal Swab     Status: None   Collection Time: 11/03/18  1:26 AM   Specimen: Nasopharyngeal Swab  Result Value Ref Range Status   SARS Coronavirus 2 NEGATIVE NEGATIVE Final    Comment: (NOTE) SARS-CoV-2 target nucleic acids are NOT DETECTED. The SARS-CoV-2 RNA is generally detectable in upper and lower respiratory specimens during the acute phase of infection. Negative results do not preclude SARS-CoV-2 infection, do not rule out co-infections with other pathogens, and should not be used as the sole basis for treatment or other patient management decisions. Negative results must be combined  with clinical observations, patient history, and epidemiological information. The expected result is Negative. Fact Sheet for Patients: https://www.fda.gov/media/138098/download Fact Sheet for Healthcare Providers: quierodirigir.comhttps://www.fda.gov/media/138095/download This test is not yet approved or cleared by the [place] FDA and  has been authorized for detection and/or diagnosis of SARS-CoV-2 by FDA under an Emergency Use Authorization (EUA). This EUA will remain  in effect (meaning this test can be used) for the duration of the COVID-19 declaration under Section 56 4(b)(1) of the Act, 21  U.S.C. section 360bbb-3(b)(1), unless the authorizationHairSlick.no is terminated or revoked sooner. Performed at [facility], [address] [place], [place] [address]   Culture, blood (Routine X 2) w Reflex to ID Panel     Status: None (Preliminary result)   Collection Time: 11/03/18  9:22 AM   Specimen: BLOOD LEFT HAND  Result Value Ref Range Status   Specimen Description   Final    BLOOD LEFT HAND Performed at [facility], [address] [place], [place] [address]    Special Requests   Final    BOTTLES DRAWN AEROBIC ONLY Blood Culture results may not be optimal due to an inadequate volume of blood received in culture bottles Performed at [facility], [address] [place], [place] [address]    Culture   Final    NO GROWTH 2 DAYS Performed at [facility], [address] [place], [place] [address]    Report Status PENDING  Incomplete  Culture, blood (Routine X 2) w Reflex to ID Panel     Status: None (Preliminary result)   Collection Time: 11/03/18  9:27 AM   Specimen: BLOOD RIGHT HAND  Result Value Ref Range Status   Specimen Description   Final    BLOOD RIGHT HAND Performed at [facility], [address] [place], [place] [address]    Special Requests   Final    BOTTLES DRAWN AEROBIC ONLY Blood Culture results may not be optimal due to an inadequate volume of blood received in culture bottles Performed at [facility], [address] [place], [place] [address]    Culture   Final    NO GROWTH 2 DAYS Performed at [facility], [address] [place], [place] [address]    Report Status PENDING  Incomplete     Labs: Basic Metabolic Panel: Recent Labs  Lab 11/03/18 0126 11/04/18 0504  NA 130* 135  K 4.0 3.7  CL 92* 95*  CO2 28 29  GLUCOSE 129* 105*  BUN 7* 8  CREATININE 0.53 0.50  CALCIUM 8.8* 8.6*   Liver Function Tests: Recent Labs  Lab 11/04/18 0504  AST 19   ALT 18  ALKPHOS 96  BILITOT 0.8  PROT 6.5  ALBUMIN 3.4*   No results for input(s): LIPASE, AMYLASE in the last 168 hours. No results for input(s): AMMONIA in the last 168 hours. CBC: Recent Labs  Lab 11/03/18 0126 11/04/18 0504  WBC 11.3* 9.5  NEUTROABS 9.8*  --   HGB 13.7 12.0  HCT 41.0 37.2  MCV 96.5 98.9  PLT 218 215   Cardiac Enzymes: No results for input(s): CKTOTAL, CKMB, CKMBINDEX, TROPONINI in the last 168 hours. BNP: BNP (last 3 results) No results for input(s): BNP in the last 8760 hours.  ProBNP (last 3 results) No results for input(s): PROBNP in the last 8760 hours.  CBG: No results for input(s): GLUCAP in the last 168 hours.  Time spent:  31 minutes were spent in preparing this discharge including medication reconciliation, counseling, and coordination of care.  Signed:  Eddison Searls Vergie Living, MD  [facility] 11/05/2018, 2:11 PM

## 2018-11-05 NOTE — Progress Notes (Signed)
Nsg Discharge Note  Admit Date:  11/03/2018 Discharge date: 11/05/2018   Kelly Snyder to be D/C'd Home per MD order.  AVS completed.  Copy for chart, and copy for patient signed, and dated. Patient/caregiver able to verbalize understanding.  Discharge Medication: Allergies as of 11/05/2018   No Known Allergies     Medication List    STOP taking these medications   HYDROcodone-acetaminophen 5-325 MG tablet Commonly known as: NORCO/VICODIN   polyethylene glycol 17 g packet Commonly known as: MiraLax     TAKE these medications   acetaminophen 500 MG tablet Commonly known as: TYLENOL Take 1,000 mg by mouth every 6 (six) hours as needed for moderate pain (pain).   albuterol 108 (90 Base) MCG/ACT inhaler Commonly known as: VENTOLIN HFA Inhale 2 puffs into the lungs every 6 (six) hours as needed for wheezing or shortness of breath.   azithromycin 250 MG tablet Commonly known as: ZITHROMAX Take 2 tablets (500 mg total) by mouth daily for 2 days.   CALCIUM 600-D PO Take 1 tablet by mouth daily.   cefUROXime 500 MG tablet Commonly known as: CEFTIN Take 1 tablet (500 mg total) by mouth 2 (two) times daily for 2 days.   guaiFENesin 100 MG/5ML Soln Commonly known as: ROBITUSSIN Take 5 mLs (100 mg total) by mouth every 4 (four) hours as needed for cough or to loosen phlegm.   multivitamin with minerals Tabs tablet Take 1 tablet by mouth daily.   predniSONE 20 MG tablet Commonly known as: DELTASONE Take 1 tablet (20 mg total) by mouth daily with breakfast for 4 days. Start taking on: November 06, 2018   Spiriva HandiHaler 18 MCG inhalation capsule Generic drug: tiotropium Place 1 capsule (18 mcg total) into inhaler and inhale daily.       Discharge Assessment: Vitals:   11/05/18 1157 11/05/18 1438  BP:  (!) 131/58  Pulse:  90  Resp:    Temp:  98 F (36.7 C)  SpO2: 90% 91%   Skin clean, dry and intact without evidence of skin break down, no evidence of skin  tears noted. IV catheter discontinued intact. Site without signs and symptoms of complications - no redness or edema noted at insertion site, patient denies c/o pain - only slight tenderness at site.  Dressing with slight pressure applied.  D/c Instructions-Education: Discharge instructions given to patient/family with verbalized understanding. D/c education completed with patient/family including follow up instructions, medication list, d/c activities limitations if indicated, with other d/c instructions as indicated by MD - patient able to verbalize understanding, all questions fully answered. Patient instructed to return to ED, call 911, or call MD for any changes in condition.  Patient escorted via [place], and D/C home via private auto.  Eustace Pen, LPN 47/04/2593 6:38 PM

## 2018-11-05 NOTE — Progress Notes (Signed)
  PROGRESS NOTE  Kelly Snyder DTO:[id number] DOB: 1945/01/07 DOA: 11/03/2018 PCP: Gerlene Fee, DO  Brief History   78yow PMH COPD not on tx presented with cough, SOB, left chest wall pain. Admitted for acute hypoxic resp failure, pneumonia.   A & P  Acute hypoxic resp failure secondary to bronchopneumonia (lobar) on chest CT --Slowly improving but not back to baseline.  Wean oxygen as tolerated to room air today. --Continue antibiotics  COPD exacerbation, emphysema  --Slowly improving.  Steroids, bronchodilators.  High-grade atheromatous narrowing of the proximal left subclavian --f/u with [facility] as an outpatient   Aortic atherosclerosis  --f/u as an outpatient  DVT prophylaxis: enoxaparin Code Status: Full Family Communication: none Disposition Plan: Home, anticipate she can be weaned to room air prior to DC home possibly in AM.  Direct contact: see [email or web address] (further directions at bottom of note if needed) 7PM-7AM contact night coverage as at bottom of note 11/05/2018, 8:52 AM  LOS: 2 days   Significant Hospital Events   . Admit 10/30 for pneumonia, COPD exacerbation   Consults:  .    Procedures:  .   Significant Diagnostic Tests:  . COVID negative . CTA chest no PE. Bilateral bronchopenumonia    Micro Data:  . 10/30 BC >    Antimicrobials:  . Ceftriaxone 10/30 . Zithromax 10/30  Interval History/Subjective  Feels better but not back to baseline.  Still short of breath.  Coughing, ambulating to bathroom. Some very minimal epistaxis, placed on humidified air.  Objective   Vitals:  Vitals:   11/05/18 0559 11/05/18 0728  BP: (!) 131/52   Pulse: 82 85  Resp: 15 14  Temp: 97.9 F (36.6 C)   SpO2: 98% 97%    Exam:  Constitutional:  . Appears calm and comfortable ENMT:  . grossly normal hearing  Respiratory:  . Some coarse breath sounds with crackles on the right.  Fair air movement.  Mild increased respiratory effort. Cardiovascular:  . RRR,  no m/r/g . No LE extremity edema   Psychiatric:  . Mental status o Mood, affect appropriate  I have personally reviewed the following:   Today's Data  . CMP unremarkable . CBC unremarkable  Scheduled Meds: . enoxaparin (LOVENOX) injection  40 mg Subcutaneous Q24H  . guaiFENesin  600 mg Oral BID  . ipratropium-albuterol  3 mL Nebulization BID  . predniSONE  40 mg Oral Q breakfast   Continuous Infusions: . azithromycin 500 mg (11/05/18 0509)  . cefTRIAXone (ROCEPHIN)  IV 1 g (11/05/18 0407)    Principal Problem:   Lobar pneumonia ([place]) Active Problems:   Acute respiratory failure with hypoxia ([place])   Aortic atherosclerosis ([place])   LOS: 2 days   How to contact the [facility] Attending or Consulting provider [place] or covering provider during after hours [place], for this patient?  1. Check the care team in [facility] and look for a) attending/consulting [facility] provider listed and b) the [facility] team listed 2. Log into [email or web address] and use [place]'s universal password to access. If you do not have the password, please contact the hospital operator. 3. Locate the [facility] provider you are looking for under [facility] and page to a number that you can be directly reached. 4. If you still have difficulty reaching the provider, please page the [facility] (Director on Call) for the Hospitalists listed on amion for assistance.

## 2018-11-05 NOTE — Progress Notes (Signed)
PHARMACIST - PHYSICIAN COMMUNICATION DR:   Hollice Gong CONCERNING: Antibiotic IV to Oral Route Change Policy  RECOMMENDATION: This patient is receiving azithromycin by the intravenous route.  Based on criteria approved by the Pharmacy and Therapeutics Committee, the antibiotic(s) is/are being converted to the equivalent oral dose form(s).   DESCRIPTION: These criteria include:  Patient being treated for a respiratory tract infection, urinary tract infection, cellulitis or clostridium difficile associated diarrhea if on metronidazole  The patient is not neutropenic and does not exhibit a GI malabsorption state  The patient is eating (either orally or via tube) and/or has been taking other orally administered medications for a least 24 hours  The patient is improving clinically and has a Tmax < 100.5  If you have questions about this conversion, please contact the Pharmacy Department  []   [phone] )  [facility] []   [phone] )  [facility] []   [phone] )  [facility] []   [phone] )  [facility] [x]   [phone] )  [place], PharmD, BCPS 11/05/2018 1:25 PM

## 2018-11-05 NOTE — Progress Notes (Signed)
Order to wean patient off continuous oxygen @ 2L [place].  1044 96% on 2L [place]  1119 97% on 1L [place]  1157 90% on 0L [place]  Patient ambulated down hall without oxygen use. Oxygen saturation level ranged between 88%-90%. Pulse ranged 72-78 bpm. Denied shortness of breath, headache, or confusion. No audible wheezing. Non-productive cough occurred once during ambulation.

## 2018-11-06 NOTE — Progress Notes (Addendum)
Patient, Kelly Snyder, called [place] on 11/06/2018 at 1445 regarding not receiving prescriptions for her new medications at discharge on 11/05/2018. The patient also states the medications were not called to her pharmacy ([place], [place]). Patient trying to contact the hospitalist who discharged her on 11/05/2018. Discharging hospitalist not on duty. I informed patient I will call and help to get prescriptions called to pharmacy for her son to pick it for her. Confirmed telephone for son and patient's home number for call back.  I paged [facility] admission beeper for call back to inform of patient not having prescriptions. Spoke with Donaciano Eva, RN, at [phone]. Reviewed patient situation and provided medications to be filled at pharmacy. RN read back medications. RN to call in medications for patient.   Notified son and patient that medications are being called to [facility] [address] in [place], [place] for pick up. Both son and patient verbalized understanding.

## 2018-11-08 LAB — CULTURE, BLOOD (ROUTINE X 2)
Culture: NO GROWTH
Culture: NO GROWTH

## 2018-11-14 NOTE — Progress Notes (Signed)
   Subjective:    Patient ID: Kelly Snyder, female    DOB: August 27, 1945, 73 y.o.   MRN: [id number]   CC: hospital f/u  HPI: COPD exacerbation Admitted to [facility] from 10/30-11/1.  Admitted for left-sided chest pain and an acute hypoxic respiratory failure secondary to pneumonia..  Past medical history significant for COPD.  During admission patient was started on ceftriaxone as well as Zithromax. On date of discharge patient was able to ambulate on room air.  Was discharged to complete a 5-day course of antibiotics as well as 20 mg prednisone.  Antibiotics on discharge included azithromycin as well as Ceftin. Coronavirus testing was negative during admission. Last albuterol use >4 hrs ago.   Patient reports doing better since dc. States she has completed her azithro and ceftin as well as prednisone. Still some rib pain after coughing spells. Denies fever. Is using an expectorant so she coughs after use. Denies SOB. Has been staying active around the house and denies SOB with exertion. States she had PFTs done 15-20 years ago and was told to stop smoking.   Tobacco use Patient reports quitting smoking on 10/28/18. Has not had a cigarette since. Previously smoked 1ppd x53 years, recently cut down to 1/2 ppd.   Atheromatous narrowing of proximal subclavian  Patient was found to have high-grade atheromatous narrowing of the proximal left subclavian and encouraged to follow-up with [facility] as an outpatient.    Objective:  BP 110/80   Pulse 84   Wt 112 lb (50.8 kg)   SpO2 94%   BMI 19.84 kg/m  Vitals and nursing note reviewed  General: well nourished, in no acute distress HEENT: normocephalic, PERRL, no scleral icterus or conjunctival pallor, no nasal discharge, moist mucous membranes, good dentition without erythema or discharge noted in posterior oropharynx Neck: supple, non-tender, without lymphadenopathy Cardiac: RRR, clear S1 and S2, no murmurs, rubs, or gallops  Respiratory: clear to auscultation bilaterally, no increased work of breathing Abdomen: soft, nontender, nondistended, no masses or organomegaly. Bowel sounds present Extremities: no edema or cyanosis. Warm, well perfused. 2+ radial and PT pulses bilaterally Skin: warm and dry, no rashes noted Neuro: alert and oriented, no focal deficits   Assessment & Plan:    COPD (chronic obstructive pulmonary disease) ([place]) Patient improved since discharge. Continuing spiriva. Advised to use albuterol PRN. Per chart review Xray did not show pneumonia but CT showed bilateral bronchopneumonia. Can consider repeat CXR in 1 month to ensure resolution. Strict return precautions given. Follow up in 1 month  TOBACCO DEPENDENCE Recently quit smoking on 10/28/18. Congratulated patient on cessation. Discussed resources available for patient, she states she would like to do it on her own.   Subclavian vein stenosis Referred to [facility]    Return in about 4 weeks (around 12/13/2018) for COPD, next available with Dr. Janus Molder for physical.   Caroline More, DO, PGY-3

## 2018-11-15 ENCOUNTER — Ambulatory Visit (INDEPENDENT_AMBULATORY_CARE_PROVIDER_SITE_OTHER): Payer: Medicare Other | Admitting: Family Medicine

## 2018-11-15 ENCOUNTER — Other Ambulatory Visit: Payer: Self-pay

## 2018-11-15 VITALS — BP 110/80 | HR 84 | Wt 112.0 lb

## 2018-11-15 DIAGNOSIS — I871 Compression of vein: Secondary | ICD-10-CM | POA: Diagnosis not present

## 2018-11-15 DIAGNOSIS — F17200 Nicotine dependence, unspecified, uncomplicated: Secondary | ICD-10-CM

## 2018-11-15 DIAGNOSIS — J449 Chronic obstructive pulmonary disease, unspecified: Secondary | ICD-10-CM

## 2018-11-15 NOTE — Patient Instructions (Addendum)

## 2018-11-16 ENCOUNTER — Encounter: Payer: Self-pay | Admitting: Family Medicine

## 2018-11-16 DIAGNOSIS — I871 Compression of vein: Secondary | ICD-10-CM | POA: Insufficient documentation

## 2018-11-16 DIAGNOSIS — I771 Stricture of artery: Secondary | ICD-10-CM | POA: Insufficient documentation

## 2018-11-16 NOTE — Assessment & Plan Note (Signed)
Referred to [facility]

## 2018-11-16 NOTE — Assessment & Plan Note (Signed)
Recently quit smoking on 10/28/18. Congratulated patient on cessation. Discussed resources available for patient, she states she would like to do it on her own.

## 2018-11-16 NOTE — Assessment & Plan Note (Signed)
Patient improved since discharge. Continuing spiriva. Advised to use albuterol PRN. Per chart review Xray did not show pneumonia but CT showed bilateral bronchopneumonia. Can consider repeat CXR in 1 month to ensure resolution. Strict return precautions given. Follow up in 1 month

## 2018-12-17 NOTE — Progress Notes (Signed)
   Subjective:    Patient ID: Kelly Snyder, female    DOB: [birth year], 73 y.o.   MRN: [id number]   CC: f/u COPD & foot pain  HPI: COPD Patient seen on 11/11 for hospital follow-up.  Was admitted for COPD exacerbation.  At that time was improving.  Advised to follow-up in 1 month to ensure improvement. Reports she is doing well. Ran out of spiriva x1 week. Was not sure if she was supposed to refill it. Can use albuterol at max twice a day, but only sometimes using it. Depends on weather.   Foot pain She reports left foot pain and swelling.  Only when she stands that she really noticed the pain.  Denies any trauma to the area.  Nothing coming positive.  Has not tried any new shoes recently.  Happening for a week now.  She only noticed swelling after the afternoon full of walking.  Can walk on the foot   Objective:  BP 118/60   Pulse 78   Wt 120 lb 12.8 oz (54.8 kg)   SpO2 97%   BMI 21.40 kg/m  Vitals and nursing note reviewed  General: well nourished, in no acute distress HEENT: normocephalic, moist mucous membranes, good dentition without erythema or discharge noted in posterior oropharynx Cardiac: RRR, clear S1 and S2, no murmurs, rubs, or gallops Respiratory: clear to auscultation bilaterally, no increased work of breathing Extremities: no edema or cyanosis. Warm, well perfused. 2+ radial, DP, and PT pulses bilaterally. No foot edema, non tender to palpation  Skin: warm and dry, no rashes noted Neuro: alert and oriented, no focal deficits   Assessment & Plan:    COPD (chronic obstructive pulmonary disease) ([place]) Continues to improve and satting well on room air.  Advised to continue Spiriva.  Can still use albuterol as needed as well.  Should continue to follow-up with PCP and consider chest x-ray if desired.  TOBACCO DEPENDENCE Continued cessation.  Congratulated patient on cessation.  Foot pain Unclear etiology.  Fracture versus osteoarthritis this patient has a history  of arthritis in her upper extremities.  Advised to use NSAIDs as needed as well as Tylenol.  Patient to get foot x-ray so we can determine the etiology.  Can consider Voltaren gel if no improvement on Tylenol.    Return in about 27 days (around 01/15/2019) for physical and follow up foot pain.   Caroline More, DO, PGY-3

## 2018-12-19 ENCOUNTER — Other Ambulatory Visit: Payer: Self-pay

## 2018-12-19 ENCOUNTER — Encounter: Payer: Self-pay | Admitting: Family Medicine

## 2018-12-19 ENCOUNTER — Ambulatory Visit (INDEPENDENT_AMBULATORY_CARE_PROVIDER_SITE_OTHER): Payer: Medicare Other | Admitting: Family Medicine

## 2018-12-19 ENCOUNTER — Ambulatory Visit
Admission: RE | Admit: 2018-12-19 | Discharge: 2018-12-19 | Disposition: A | Discharge status: Home / Self Care | Payer: Medicare Other | Source: Ambulatory Visit | Attending: Family Medicine | Admitting: Family Medicine

## 2018-12-19 VITALS — BP 118/60 | HR 78 | Wt 120.8 lb

## 2018-12-19 DIAGNOSIS — M79672 Pain in left foot: Secondary | ICD-10-CM | POA: Diagnosis not present

## 2018-12-19 DIAGNOSIS — F17200 Nicotine dependence, unspecified, uncomplicated: Secondary | ICD-10-CM

## 2018-12-19 DIAGNOSIS — J449 Chronic obstructive pulmonary disease, unspecified: Secondary | ICD-10-CM

## 2018-12-19 MED ORDER — SPIRIVA HANDIHALER 18 MCG IN CAPS: 18.0000 ug | ORAL_CAPSULE | Freq: Every day | RESPIRATORY_TRACT | 2 refills | Status: DC

## 2018-12-19 NOTE — Patient Instructions (Signed)

## 2018-12-20 ENCOUNTER — Telehealth: Payer: Self-pay | Admitting: *Deleted

## 2018-12-20 NOTE — Telephone Encounter (Signed)
-----   Message from Caroline More, DO sent at 12/20/2018  2:16 PM EST ----- Please inform patient that results of xray are negative.

## 2018-12-20 NOTE — Telephone Encounter (Signed)
Pt informed. Kelly Snyder, CMA  

## 2018-12-21 DIAGNOSIS — M79673 Pain in unspecified foot: Secondary | ICD-10-CM | POA: Insufficient documentation

## 2018-12-21 NOTE — Assessment & Plan Note (Signed)
Unclear etiology.  Fracture versus osteoarthritis this patient has a history of arthritis in her upper extremities.  Advised to use NSAIDs as needed as well as Tylenol.  Patient to get foot x-ray so we can determine the etiology.  Can consider Voltaren gel if no improvement on Tylenol.

## 2018-12-21 NOTE — Assessment & Plan Note (Signed)
Continues to improve and satting well on room air.  Advised to continue Spiriva.  Can still use albuterol as needed as well.  Should continue to follow-up with PCP and consider chest x-ray if desired.

## 2018-12-21 NOTE — Assessment & Plan Note (Signed)
Continued cessation.  Congratulated patient on cessation.

## 2018-12-28 ENCOUNTER — Encounter: Payer: Self-pay | Admitting: Vascular Surgery

## 2018-12-28 ENCOUNTER — Ambulatory Visit (INDEPENDENT_AMBULATORY_CARE_PROVIDER_SITE_OTHER): Payer: Medicare Other | Admitting: Vascular Surgery

## 2018-12-28 ENCOUNTER — Other Ambulatory Visit: Payer: Self-pay

## 2018-12-28 VITALS — BP 126/66 | HR 76 | Temp 97.7°F | Resp 20 | Ht 63.0 in | Wt 120.9 lb

## 2018-12-28 DIAGNOSIS — I771 Stricture of artery: Secondary | ICD-10-CM

## 2018-12-28 NOTE — Progress Notes (Signed)
REASON FOR CONSULT:    Subclavian artery stenosis.  The consult is requested by Dr. Talbert Cage.  ASSESSMENT & PLAN:   LEFT SUBCLAVIAN ARTERY STENOSIS: This patient has essentially an asymptomatic left subclavian artery stenosis.  The artery is heavily calcified.  I explained that we would not consider addressing the stenosis unless she developed significant left upper extremity paresthesias or pain or persistent dizziness.  I have instructed her to be sure that her blood pressure is taken in the right arm as this would be more accurate.  If she did develop symptoms of subclavian steal with dizziness or vertigo then given the amount of calcific disease in the artery she would be at increased risk for angioplasty and would potentially require bypass.  Fortunately she quit smoking 2 months ago.  We have discussed the importance of exercise and nutrition.  I will be happy to see her back at any time if she develops new symptoms.  Deitra Mayo, MD Office: [phone]   HPI:   Kelly Snyder is a pleasant 73 y.o. female, with a history of COPD.  She was hospitalized in October with pneumonia.  During that hospitalization she had a CT angiogram and an incidental finding was a significant stenosis in the left subclavian artery.  For this reason she was sent for vascular consultation.  She is right-handed.  She denies any history of stroke, TIAs, expressive or receptive aphasia, or amaurosis fugax.  She has some occasional paresthesias in the left arm but is had a previous fracture.  She fractured her arm in December 2019.  She denies any problems with dizziness or vertigo.  Risk factors for peripheral vascular disease are tobacco use although she quit 2 months ago.  She denies any history of diabetes, hypertension, hypercholesterolemia, or family history of premature cardiovascular disease.  Past Medical History:  Diagnosis Date  . Arthritis   . COPD (chronic obstructive pulmonary  disease) ([place])   . Pneumonia     Family History  Problem Relation Age of Onset  . Diabetes Mother   . Heart disease Mother   . Alcohol abuse Son   . Heart disease Sister     SOCIAL HISTORY: Social History   Socioeconomic History  . Marital status: Divorced    Spouse name: Not on file  . Number of children: 1  . Years of education: 35  . Highest education level: Not on file  Occupational History  . Occupation: retired  Tobacco Use  . Smoking status: Former Smoker    Packs/day: 0.50    Years: 49.00    Pack years: 24.50    Types: Cigarettes    Start date: 01/04/1966  . Smokeless tobacco: Never Used  . Tobacco comment: quit 10/28/18  Substance and Sexual Activity  . Alcohol use: No  . Drug use: No  . Sexual activity: Never  Other Topics Concern  . Not on file  Social History Narrative   Health Care POA:    Emergency Contact: Annie Main (son) [phone]   End of Life Plan: Gave pt pamplet   Who lives with you: Son   Any pets: 2 dogs and 5 cats   Diet: Pt reports enjoying fruits and vegetables from her garden.    Exercise: Pt reports walking around at yard sales and gardening during the week.    Seatbelts: Pt reports wearing seatbelt in vehicle   Sun Exposure/Protection: pt reports avoid the sun and only going  Out in the morning or the late  evening times.    Hobbies: Pt enjoys going to yard sales.    Social Determinants of Health   Financial Resource Strain:   . Difficulty of Paying Living Expenses: Not on file  Food Insecurity:   . Worried About Programme researcher, broadcasting/film/videounning Out of Food in the Last Year: Not on file  . Ran Out of Food in the Last Year: Not on file  Transportation Needs:   . Lack of Transportation (Medical): Not on file  . Lack of Transportation (Non-Medical): Not on file  Physical Activity:   . Days of Exercise per Week: Not on file  . Minutes of Exercise per Session: Not on file  Stress:   . Feeling of Stress : Not on file  Social Connections:   . Frequency of  Communication with Friends and Family: Not on file  . Frequency of Social Gatherings with Friends and Family: Not on file  . Attends Religious Services: Not on file  . Active Member of Clubs or Organizations: Not on file  . Attends BankerClub or Organization Meetings: Not on file  . Marital Status: Not on file  Intimate Partner Violence:   . Fear of Current or Ex-Partner: Not on file  . Emotionally Abused: Not on file  . Physically Abused: Not on file  . Sexually Abused: Not on file    No Known Allergies  Current Outpatient Medications  Medication Sig Dispense Refill  . acetaminophen (TYLENOL) 500 MG tablet Take 1,000 mg by mouth every 6 (six) hours as needed for moderate pain (pain).     Marland Kitchen. albuterol (VENTOLIN HFA) 108 (90 Base) MCG/ACT inhaler Inhale 2 puffs into the lungs every 6 (six) hours as needed for wheezing or shortness of breath. 8 g 1  . Calcium Carb-Cholecalciferol (CALCIUM 600-D PO) Take 1 tablet by mouth daily.    Marland Kitchen. guaiFENesin (ROBITUSSIN) 100 MG/5ML SOLN Take 5 mLs (100 mg total) by mouth every 4 (four) hours as needed for cough or to loosen phlegm. 236 mL 0  . Multiple Vitamin (MULTIVITAMIN WITH MINERALS) TABS tablet Take 1 tablet by mouth daily.    Marland Kitchen. tiotropium (SPIRIVA HANDIHALER) 18 MCG inhalation capsule Place 1 capsule (18 mcg total) into inhaler and inhale daily. 30 capsule 2   No current facility-administered medications for this visit.    REVIEW OF SYSTEMS:  [X]  denotes positive finding, [ ]  denotes negative finding Cardiac  Comments:  Chest pain or chest pressure:    Shortness of breath upon exertion:    Short of breath when lying flat:    Irregular heart rhythm:        Vascular    Pain in calf, thigh, or hip brought on by ambulation:    Pain in feet at night that wakes you up from your sleep:     Blood clot in your veins:    Leg swelling:         Pulmonary    Oxygen at home:    Productive cough:     Wheezing:  x       Neurologic    Sudden weakness  in arms or legs:     Sudden numbness in arms or legs:     Sudden onset of difficulty speaking or slurred speech:    Temporary loss of vision in one eye:     Problems with dizziness:         Gastrointestinal    Blood in stool:     Vomited blood:  Genitourinary    Burning when urinating:     Blood in urine:        Psychiatric    Major depression:         Hematologic    Bleeding problems:    Problems with blood clotting too easily:        Skin    Rashes or ulcers:        Constitutional    Fever or chills:     PHYSICAL EXAM:   Vitals:   12/28/18 1034  BP: 109/68  Pulse: 76  Resp: 20  Temp: 97.7 F (36.5 C)  SpO2: 96%  Weight: 120 lb 14.4 oz (54.8 kg)  Height: 5\' 3"  (1.6 m)   Her blood pressure in the left arm was 109/68. Her blood pressure in the right arm was 126/66.   GENERAL: The patient is a well-nourished female, in no acute distress. The vital signs are documented above. CARDIAC: There is a regular rate and rhythm.  VASCULAR: I do not detect a right carotid bruit. She has a left supraclavicular bruit which radiates to the left neck also. She has palpable radial pulses bilaterally. She has palpable femoral, popliteal, dorsalis pedis, posterior tibial pulses bilaterally. PULMONARY: There is good air exchange bilaterally without wheezing or rales. ABDOMEN: Soft and non-tender with normal pitched bowel sounds.  MUSCULOSKELETAL: There are no major deformities or cyanosis. NEUROLOGIC: No focal weakness or paresthesias are detected. SKIN: There are no ulcers or rashes noted. PSYCHIATRIC: The patient has a normal affect.  DATA:    CT ANGIOGRAM CHEST: I did review the images of her CT angiogram of the chest.  This shows a stenosis of the proximal left subclavian artery which is markedly calcified.  The aortic arch has some calcific disease.  The right subclavian proximal right common carotid artery, and proximal left common carotid artery do not appear to  have significant disease.  LABS: Her GFR is greater than 60.

## 2019-01-15 ENCOUNTER — Encounter: Payer: Self-pay | Admitting: Family Medicine

## 2019-01-15 ENCOUNTER — Ambulatory Visit (INDEPENDENT_AMBULATORY_CARE_PROVIDER_SITE_OTHER): Payer: Medicare Other | Admitting: Family Medicine

## 2019-01-15 ENCOUNTER — Other Ambulatory Visit: Payer: Self-pay

## 2019-01-15 VITALS — BP 122/64 | HR 76 | Wt 124.6 lb

## 2019-01-15 DIAGNOSIS — F17200 Nicotine dependence, unspecified, uncomplicated: Secondary | ICD-10-CM | POA: Diagnosis not present

## 2019-01-15 DIAGNOSIS — E785 Hyperlipidemia, unspecified: Secondary | ICD-10-CM

## 2019-01-15 DIAGNOSIS — J439 Emphysema, unspecified: Secondary | ICD-10-CM | POA: Diagnosis not present

## 2019-01-15 DIAGNOSIS — Z0000 Encounter for general adult medical examination without abnormal findings: Secondary | ICD-10-CM | POA: Diagnosis not present

## 2019-01-15 DIAGNOSIS — I771 Stricture of artery: Secondary | ICD-10-CM | POA: Diagnosis not present

## 2019-01-15 DIAGNOSIS — I2510 Atherosclerotic heart disease of native coronary artery without angina pectoris: Secondary | ICD-10-CM | POA: Diagnosis not present

## 2019-01-15 LAB — POCT GLYCOSYLATED HEMOGLOBIN (HGB A1C): HbA1c, POC (controlled diabetic range): 4.9 % (ref 0.0–7.0)

## 2019-01-15 NOTE — Patient Instructions (Addendum)
It was very nice to meet you today. Please enjoy the rest of your week. Today you were seen for routine health visit. Continue to take your Spiriva daily.  Follow up in 1 year for wellness exam or sooner if needed.   Please call the clinic at [phone] if your symptoms worsen or you have any concerns. It was our pleasure to serve you.   Health Maintenance, Female Adopting a healthy lifestyle and getting preventive care are important in promoting health and wellness. Ask your health care provider about:  The right schedule for you to have regular tests and exams.  Things you can do on your own to prevent diseases and keep yourself healthy. What should I know about diet, weight, and exercise? Eat a healthy diet   Eat a diet that includes plenty of vegetables, fruits, low-fat dairy products, and lean protein.  Do not eat a lot of foods that are high in solid fats, added sugars, or sodium. Maintain a healthy weight Body mass index (BMI) is used to identify weight problems. It estimates body fat based on height and weight. Your health care provider can help determine your BMI and help you achieve or maintain a healthy weight. Get regular exercise Get regular exercise. This is one of the most important things you can do for your health. Most adults should:  Exercise for at least 150 minutes each week. The exercise should increase your heart rate and make you sweat (moderate-intensity exercise).  Do strengthening exercises at least twice a week. This is in addition to the moderate-intensity exercise.  Spend less time sitting. Even light physical activity can be beneficial. Watch cholesterol and blood lipids Have your blood tested for lipids and cholesterol at 73 years of age, then have this test every 5 years. Have your cholesterol levels checked more often if:  Your lipid or cholesterol levels are high.  You are older than 73 years of age.  You are at high risk for heart  disease. What should I know about cancer screening? Depending on your health history and family history, you may need to have cancer screening at various ages. This may include screening for:  Breast cancer.  Cervical cancer.  Colorectal cancer.  Skin cancer.  Lung cancer. What should I know about heart disease, diabetes, and high blood pressure? Blood pressure and heart disease  High blood pressure causes heart disease and increases the risk of stroke. This is more likely to develop in people who have high blood pressure readings, are of African descent, or are overweight.  Have your blood pressure checked: ? Every 3-5 years if you are 17-13 years of age. ? Every year if you are 9 years old or older. Diabetes Have regular diabetes screenings. This checks your fasting blood sugar level. Have the screening done:  Once every three years after age 14 if you are at a normal weight and have a low risk for diabetes.  More often and at a younger age if you are overweight or have a high risk for diabetes. What should I know about preventing infection? Hepatitis B If you have a higher risk for hepatitis B, you should be screened for this virus. Talk with your health care provider to find out if you are at risk for hepatitis B infection. Hepatitis C Testing is recommended for:  Everyone born from [birth year] through 1964.  Anyone with known risk factors for hepatitis C. Sexually transmitted infections (STIs)  Get screened for STIs, including gonorrhea and  chlamydia, if: ? You are sexually active and are younger than 73 years of age. ? You are older than 73 years of age and your health care provider tells you that you are at risk for this type of infection. ? Your sexual activity has changed since you were last screened, and you are at increased risk for chlamydia or gonorrhea. Ask your health care provider if you are at risk.  Ask your health care provider about whether you are at high  risk for HIV. Your health care provider may recommend a prescription medicine to help prevent HIV infection. If you choose to take medicine to prevent HIV, you should first get tested for HIV. You should then be tested every 3 months for as long as you are taking the medicine. Pregnancy  If you are about to stop having your period (premenopausal) and you may become pregnant, seek counseling before you get pregnant.  Take 400 to 800 micrograms (mcg) of folic acid every day if you become pregnant.  Ask for birth control (contraception) if you want to prevent pregnancy. Osteoporosis and menopause Osteoporosis is a disease in which the bones lose minerals and strength with aging. This can result in bone fractures. If you are 55 years old or older, or if you are at risk for osteoporosis and fractures, ask your health care provider if you should:  Be screened for bone loss.  Take a calcium or vitamin D supplement to lower your risk of fractures.  Be given hormone replacement therapy (HRT) to treat symptoms of menopause. Follow these instructions at home: Lifestyle  Do not use any products that contain nicotine or tobacco, such as cigarettes, e-cigarettes, and chewing tobacco. If you need help quitting, ask your health care provider.  Do not use street drugs.  Do not share needles.  Ask your health care provider for help if you need support or information about quitting drugs. Alcohol use  Do not drink alcohol if: ? Your health care provider tells you not to drink. ? You are pregnant, may be pregnant, or are planning to become pregnant.  If you drink alcohol: ? Limit how much you use to 0-1 drink a day. ? Limit intake if you are breastfeeding.  Be aware of how much alcohol is in your drink. In the [place] one drink equals one 12 oz bottle of beer (355 mL), one 5 oz glass of wine (148 mL), or one 1 oz glass of hard liquor (44 mL). General instructions  Schedule regular health, dental,  and eye exams.  Stay current with your vaccines.  Tell your health care provider if: ? You often feel depressed. ? You have ever been abused or do not feel safe at home. Summary  Adopting a healthy lifestyle and getting preventive care are important in promoting health and wellness.  Follow your health care provider's instructions about healthy diet, exercising, and getting tested or screened for diseases.  Follow your health care provider's instructions on monitoring your cholesterol and blood pressure. This information is not intended to replace advice given to you by your health care provider. Make sure you discuss any questions you have with your health care provider. Document Revised: 12/14/2017 Document Reviewed: 12/14/2017 Elsevier Patient Education  2020 [employer].   Colonoscopy, Adult A colonoscopy is a procedure to look at the entire large intestine. This procedure is done using a long, thin, flexible tube that has a camera on the end. You may have a colonoscopy:  As a  part of normal colorectal screening.  If you have certain symptoms, such as: ? A low number of red blood cells in your blood (anemia). ? Diarrhea that does not go away. ? Pain in your abdomen. ? Blood in your stool. A colonoscopy can help screen for and diagnose medical problems, including:  Tumors.  Extra tissue that grows where mucus forms (polyps).  Inflammation.  Areas of bleeding. Tell your health care provider about:  Any allergies you have.  All medicines you are taking, including vitamins, herbs, eye drops, creams, and over-the-counter medicines.  Any problems you or family members have had with anesthetic medicines.  Any blood disorders you have.  Any surgeries you have had.  Any medical conditions you have.  Any problems you have had with having bowel movements.  Whether you are pregnant or may be pregnant. What are the risks? Generally, this is a safe procedure. However,  problems may occur, including:  Bleeding.  Damage to your intestine.  Allergic reactions to medicines given during the procedure.  Infection. This is rare. What happens before the procedure? Eating and drinking restrictions Follow instructions from your health care provider about eating or drinking restrictions, which may include:  A few days before the procedure: ? Follow a low-fiber diet. ? Avoid nuts, seeds, dried fruit, raw fruits, and vegetables.  1-3 days before the procedure: ? Eat only gelatin dessert or ice pops. ? Drink only clear liquids, such as water, clear juice, clear broth or bouillon, black coffee or tea, or clear soft drinks or sports drinks. ? Avoid liquids that contain red or purple dye.  The day of the procedure: ? Do not eat solid foods. You may continue to drink clear liquids until up to 2 hours before the procedure. ? Do not eat or drink anything starting 2 hours before the procedure, or within the time period that your health care provider recommends. Bowel prep If you were prescribed a bowel prep to take by mouth (orally) to clean out your colon:  Take it as told by your health care provider. Starting the day before your procedure, you will need to drink a large amount of liquid medicine. The liquid will cause you to have many bowel movements of loose stool until your stool becomes almost clear or light green.  If your skin or the opening between the buttocks (anus) gets irritated from diarrhea, you may relieve the irritation using: ? Wipes with medicine in them, such as adult wet wipes with aloe and vitamin E. ? A product to soothe skin, such as petroleum jelly.  If you vomit while drinking the bowel prep: ? Take a break for up to 60 minutes. ? Begin the bowel prep again. ? Call your health care provider if you keep vomiting or you cannot take the bowel prep without vomiting.  To clean out your colon, you may also be given: ? Laxative medicines.  These help you have a bowel movement. ? Instructions for enema use. An enema is liquid medicine injected into your rectum. Medicines Ask your health care provider about:  Changing or stopping your regular medicines or supplements. This is especially important if you are taking iron supplements, diabetes medicines, or blood thinners.  Taking medicines such as aspirin and ibuprofen. These medicines can thin your blood. Do not take these medicines unless your health care provider tells you to take them.  Taking over-the-counter medicines, vitamins, herbs, and supplements. General instructions  Ask your health care provider what steps will  be taken to help prevent infection. These may include washing skin with a germ-killing soap.  Plan to have someone take you home from the hospital or clinic. What happens during the procedure?   An IV will be inserted into one of your veins.  You may be given one or more of the following: ? A medicine to help you relax (sedative). ? A medicine to numb the area (local anesthetic). ? A medicine to make you fall asleep (general anesthetic). This is rarely needed.  You will lie on your side with your knees bent.  The tube will: ? Have oil or gel put on it (be lubricated). ? Be inserted into your anus. ? Be gently eased through all parts of your large intestine.  Air will be sent into your colon to keep it open. This may cause some pressure or cramping.  Images will be taken with the camera and will appear on a screen.  A small tissue sample may be removed to be looked at under a microscope (biopsy). The tissue may be sent to a lab for testing if any signs of problems are found.  If small polyps are found, they may be removed and checked for cancer cells.  When the procedure is finished, the tube will be removed. The procedure may vary among health care providers and hospitals. What happens after the procedure?  Your blood pressure, heart rate,  breathing rate, and blood oxygen level will be monitored until you leave the hospital or clinic.  You may have a small amount of blood in your stool.  You may pass gas and have mild cramping or bloating in your abdomen. This is caused by the air that was used to open your colon during the exam.  Do not drive for 24 hours after the procedure.  It is up to you to get the results of your procedure. Ask your health care provider, or the department that is doing the procedure, when your results will be ready. Summary  A colonoscopy is a procedure to look at the entire large intestine.  Follow instructions from your health care provider about eating and drinking before the procedure.  If you were prescribed an oral bowel prep to clean out your colon, take it as told by your health care provider.  During the colonoscopy, a flexible tube with a camera on its end is inserted into the anus and then passed into the other parts of the large intestine. This information is not intended to replace advice given to you by your health care provider. Make sure you discuss any questions you have with your health care provider. Document Revised: 07/14/2018 Document Reviewed: 07/14/2018 Elsevier Patient Education  2020 [employer].

## 2019-01-15 NOTE — Progress Notes (Signed)
  Patient Name: Kelly Snyder Date of Birth: [birth year] Date of Visit: 01/15/19 PCP: Lavonda Jumbo, DO  Chief Complaint: Annual physical exam  Subjective  Kelly Snyder is a pleasant 74 y.o. with medical history significant for COPD, lobar pneumonia, recent smoking cessation, hyperlipidemia presenting today for physical exam.  COPD/Hx of pneumonia/smoking cessation She is doing well and feels good every since quitting smoking in October. She was hospitalized for bilateral bronchopneumonia. She was treated with steroids and antibiotics. She decided to quit smoking all together. When asked if she is using anything to curb her cravings, she said no she figured if she couldn't stop all on her own she didn't have any will power. She does continue to crave a cigarette and understands this will continue to occur. No longer has smoker's cough but experiencing dry mouth. She also says she is gaining weight since quitting. Today she does not endorse any chest pain, shortness of breath, sputum production.  Left Subclavian Artery stenosis Found on chest CTA during hospitalization. Has a history of left hand fracture in 2019 and other than not being able to grip as she normally could prior, she say she functions normally with that arm. She has been told to not get blood pressure taken in her left arm. She denies paresthesias and weakness other than her finger grip which has improved following physical therapy.    ROS: Per HPI.   I have reviewed the patient's medical, surgical, family, and social history as appropriate.    Objective  Vitals:   01/15/19 1543  BP: 122/64  Pulse: 76  SpO2: 98%    General: Appears well, no acute distress. Age appropriate. Cardiac: RRR, normal heart sounds, no murmurs Respiratory: CTAB, normal effort Abdomen: soft, nontender, nondistended Extremities: No edema or cyanosis. Skin: Warm and dry, no rashes noted Neuro: alert and oriented, no focal  deficits Psych: normal affect  Plan & Assessment  Hyperlipidemia Lipid panel: tot chol (218), LDL (117), HDL (94) triglycerides (40). Statin naive. ASCVD risk is 15.8% if optimized, could reduce risk to 8.6%. Needs to start high intensity statin and continue with smoking cessation. -Start Rosuvastatin 20mg  daily -Continue w/ smoking cessation -Repeat lipid panel annually  Subclavian artery stenosis (HCC) Stable. Has had vascular surgery evaluation. Continues to be asymptomatic. Will continue to monitor for initiation of symptoms and the need for a repeat consult to vascular surgery at that time. -Continue to monitor for symptoms -Do not take BPs in left arm (Note in patient's chart) -Follow up if symptoms occurs or PRN.   COPD (chronic obstructive pulmonary disease) (HCC) Stable. Treated for pneumonia in October. No respiratory issues since. Has since stopped smoking. Started on Spiriva.  -Continue with Spiriva daily (this is a maintenance medication) -Continue albuterol PRN -Follow up PRN  TOBACCO DEPENDENCE Currently well controlled. Cessation since October. Continue to have cravings. Voiced understanding of relapse w/o proper treatment. -Continue smoking cessation  -follow up as needed or for treatment options if smoking resumes.  Annual physical exam Doing well. Smoking cessation will be very beneficial to her overall health. LDL goal <100. Will start statin. POCT Hgb A1c 4.9.  Has never had a colonoscopy, influenza vaccine, or pneumovax. Does not want to do a colonoscopy, does stool guaiac tests through insurance company. Will need to receive those records. Is opposed to flu and pneumonia vaccines. -Follow up in 1 year or PRN    November, DO [facility] PGY-1

## 2019-01-16 DIAGNOSIS — Z0000 Encounter for general adult medical examination without abnormal findings: Secondary | ICD-10-CM | POA: Insufficient documentation

## 2019-01-16 LAB — LIPID PANEL
Chol/HDL Ratio: 2.3 ratio (ref 0.0–4.4)
Cholesterol, Total: 218 mg/dL — ABNORMAL HIGH (ref 100–199)
HDL: 94 mg/dL (ref >39)
LDL Chol Calc (NIH): 117 mg/dL — ABNORMAL HIGH (ref 0–99)
Triglycerides: 40 mg/dL (ref 0–149)
VLDL Cholesterol Cal: 7 mg/dL (ref 5–40)

## 2019-01-16 MED ORDER — ROSUVASTATIN CALCIUM 20 MG PO TABS: 20.0000 mg | ORAL_TABLET | Freq: Every day | ORAL | 11 refills | Status: DC

## 2019-01-16 NOTE — Assessment & Plan Note (Addendum)
Stable. Has had vascular surgery evaluation. Continues to be asymptomatic. Will continue to monitor for initiation of symptoms and the need for a repeat consult to vascular surgery at that time. -Continue to monitor for symptoms -Do not take BPs in left arm (Note in patient's chart) -Follow up if symptoms occurs or PRN.

## 2019-01-16 NOTE — Assessment & Plan Note (Signed)
Lipid panel: tot chol (218), LDL (117), HDL (94) triglycerides (40). Statin naive. ASCVD risk is 15.8% if optimized, could reduce risk to 8.6%. Needs to start high intensity statin and continue with smoking cessation. -Start Rosuvastatin 20mg  daily -Continue w/ smoking cessation -Repeat lipid panel annually

## 2019-01-16 NOTE — Assessment & Plan Note (Addendum)
Doing well. Smoking cessation will be very beneficial to her overall health. LDL goal <100. Will start statin. POCT Hgb A1c 4.9.  Has never had a colonoscopy, influenza vaccine, or pneumovax. Does not want to do a colonoscopy, does stool guaiac tests through insurance company. Will need to receive those records. Is opposed to flu and pneumonia vaccines. -Follow up in 1 year or PRN

## 2019-01-16 NOTE — Assessment & Plan Note (Addendum)
Stable. Treated for pneumonia in October. No respiratory issues since. Has since stopped smoking. Started on Spiriva.  -Continue with Spiriva daily (this is a maintenance medication) -Continue albuterol PRN -Follow up PRN

## 2019-01-16 NOTE — Assessment & Plan Note (Signed)
Currently well controlled. Cessation since October. Continue to have cravings. Voiced understanding of relapse w/o proper treatment. -Continue smoking cessation  -follow up as needed or for treatment options if smoking resumes.

## 2019-01-17 ENCOUNTER — Telehealth: Payer: Self-pay | Admitting: *Deleted

## 2019-01-17 NOTE — Telephone Encounter (Signed)
-----   Message from [place], DO sent at 01/16/2019  5:51 PM EST ----- Attempted to call Kelly Snyder with her results. Her Hbg A1c is 4.9. This is good and does not reflect diabetes. The lipid panel had high LDL of 117. LDL is the bad cholesterol and we would like it <100. HDL which is the good cholesterol is 94, this is great. Over all she has high cholesterol and needs to start taking a statin to help bring her cholesterol down. I have sent in Rosuvastatin (Crestor) 20mg  daily into her pharmacy. The biggest side effect of this medication is muscle aches or pains. Please call if you experience this.   , DO [facility] PGY-1

## 2019-01-17 NOTE — Telephone Encounter (Signed)
Pt informed. Justen Fonda, CMA  

## 2019-01-22 ENCOUNTER — Other Ambulatory Visit: Payer: Self-pay

## 2019-01-22 MED ORDER — ALBUTEROL SULFATE HFA 108 (90 BASE) MCG/ACT IN AERS: 2.0000 | INHALATION_SPRAY | Freq: Four times a day (QID) | RESPIRATORY_TRACT | 1 refills | Status: DC | PRN

## 2019-01-24 ENCOUNTER — Telehealth: Payer: Self-pay

## 2019-01-24 NOTE — Telephone Encounter (Signed)
Pt calls nurse line regarding management of elevated cholesterol. Pt states that she would like to try diet modifications to improve cholesterol before starting medication. Pt states that she never picked up crestor from pharmacy.   Pt was wondering if we had any pamphlets or educational brochures on how to manage cholesterol without medications.   Please advise   To PCP  Veronda Prude, RN

## 2019-01-26 ENCOUNTER — Encounter: Payer: Self-pay | Admitting: Family Medicine

## 2019-01-26 NOTE — Telephone Encounter (Signed)
I believe a statin medication would be more beneficial to Ms. Foxworth but if she wants to explore other options we can discuss it further at a subsequent appointment. If she would like I could give her materials on the benefits of statins as well as making diet modifications. Please let me know if she would like for me to print them as her Mychart is not available for me to send them in that manner.   Lavonda Jumbo, DO [facility] PGY-1

## 2019-01-30 NOTE — Telephone Encounter (Signed)
Explained below to patient and assisted in setting up MyChart. She is still interested in receiving those resources for controlling cholesterol with diet modification. Per patient, she recently quit smoking and has been eating more unhealthy than usual. Pt also asking about when she needs to follow up with PCP and obtain repeat lab work to re-check cholesterol levels.   Kelly Prude, RN

## 2019-02-01 NOTE — Telephone Encounter (Signed)
Attempted to call Ms. Kelly Snyder this evening. There was no answer. If you would be so kind as to relay to her that it is not necessary to repeat a lipid level at this time and we typically will get cholesterol levels more so on a yearly basis but if she would like, I can send her dietary changes and we can plan to repeat her lipid levels in 6 months. While I believe a statin medication would greatly benefit seeing change in her numbers, I will support her desire to attempt to change her numbers with diet modifications. Thank you.   Lavonda Jumbo, DO [facility] PGY-1

## 2019-02-23 ENCOUNTER — Other Ambulatory Visit: Payer: Self-pay

## 2019-02-23 NOTE — Patient Outreach (Signed)
[facility] Customer service manager [facility]) Care Management  02/23/2019  Kelly Snyder October 09, 1945 [id number]   Medication Adherence call to Kelly Snyder [facility] [employer] Verify spoke with patient she is past due on Rosuvastatin 20 mg,patient explain she has not started taking this medication because she is afraid of the side effects,patient explain she has been reading about the side effects and wants to try different methods(healthy diet,quit smoking),patient has left a message at the doctors office to see what can she do but ,has not receive a respond back. Kelly Snyder is showing past due under [facility] Ins.  Lillia Abed CPhT Pharmacy Technician [facility] Care Management Direct Dial [phone]  Fax [phone] [email or web address]

## 2019-03-14 ENCOUNTER — Telehealth: Payer: Self-pay

## 2019-03-14 NOTE — Telephone Encounter (Signed)
Pt would like to have her lipid panel taken again. Pt has not taken cholesterol meds. Pt has been trying to lower cholesterol by diet changes. Pt can be reached at [phone] once orders are in. Pt will want to schedule a 9:00 am lab appt. Kelly Snyder, CMA

## 2019-03-16 NOTE — Telephone Encounter (Signed)
Patient called. Will make an appointment with me in June to discuss statin therapy.

## 2019-03-18 ENCOUNTER — Other Ambulatory Visit: Payer: Self-pay | Admitting: Family Medicine

## 2019-04-27 ENCOUNTER — Other Ambulatory Visit: Payer: Self-pay

## 2019-04-30 ENCOUNTER — Telehealth: Payer: Self-pay | Admitting: Family Medicine

## 2019-04-30 ENCOUNTER — Other Ambulatory Visit: Payer: Self-pay | Admitting: Family Medicine

## 2019-04-30 DIAGNOSIS — Z1231 Encounter for screening mammogram for malignant neoplasm of breast: Secondary | ICD-10-CM

## 2019-04-30 NOTE — Telephone Encounter (Signed)
Patient is needing a referral for bone density states it has been about 2 or 3 years since shes had one and they wont do it unless Dr calls it in. SA

## 2019-05-02 NOTE — Telephone Encounter (Signed)
Spoke with patient and explained that a DEXA is not necessary biennial as there is weak evidence for repeating. In her case her last two dexa scans (2015, 2019) were very similar. She takes calcium and a multivitamin. Suggest getting a vitamin d level at next visit (05/07/2019). Patient voiced understanding.  Dr. Salvadore Dom

## 2019-05-07 ENCOUNTER — Ambulatory Visit (INDEPENDENT_AMBULATORY_CARE_PROVIDER_SITE_OTHER): Payer: Medicare Other | Admitting: Family Medicine

## 2019-05-07 ENCOUNTER — Other Ambulatory Visit: Payer: Self-pay

## 2019-05-07 ENCOUNTER — Encounter: Payer: Self-pay | Admitting: Family Medicine

## 2019-05-07 ENCOUNTER — Ambulatory Visit
Admission: RE | Admit: 2019-05-07 | Discharge: 2019-05-07 | Disposition: A | Discharge status: Home / Self Care | Payer: Medicare Other | Source: Ambulatory Visit | Attending: Family Medicine | Admitting: Family Medicine

## 2019-05-07 VITALS — BP 141/80 | HR 81 | Ht 63.0 in | Wt 130.6 lb

## 2019-05-07 DIAGNOSIS — E785 Hyperlipidemia, unspecified: Secondary | ICD-10-CM | POA: Diagnosis not present

## 2019-05-07 DIAGNOSIS — R600 Localized edema: Secondary | ICD-10-CM | POA: Diagnosis not present

## 2019-05-07 DIAGNOSIS — J439 Emphysema, unspecified: Secondary | ICD-10-CM

## 2019-05-07 DIAGNOSIS — Z1231 Encounter for screening mammogram for malignant neoplasm of breast: Secondary | ICD-10-CM | POA: Diagnosis not present

## 2019-05-07 MED ORDER — SPIRIVA RESPIMAT 2.5 MCG/ACT IN AERS: 2.0000 | INHALATION_SPRAY | Freq: Every day | RESPIRATORY_TRACT | 3 refills | Status: DC

## 2019-05-07 NOTE — Progress Notes (Signed)
    SUBJECTIVE:   CHIEF COMPLAINT / HPI:   Lower extremity swelling Kelly Snyder is a 74 year old female who presents today for 6 months of bilateral lower extremity swelling.  She believes this is due to Spiriva as she has read online that this causes edema. She endorses complaince with spiriva and has not had any recent respiratory issues. She has gained and has increased the pant size she is wearing. She denies trauma, leg pain, shortness of breath, chest pain, chest tightness, or loss of sensation.   PERTINENT  PMH / PSH: COPD, Osteoporosis, hx of tobacco dependence, HLD  OBJECTIVE:   BP (!) 141/80   Pulse 81   Ht 5\' 3"  (1.6 m)   Wt 130 lb 9.6 oz (59.2 kg)   SpO2 96%   BMI 23.13 kg/m    General: Appears well, no acute distress. Age appropriate. Cardiac: RRR, normal heart sounds, no murmurs Respiratory: CTAB, normal effort Extremities: Non pitting BLE edema. Non-tender gastrocnemii. WWP. No size discrepancy.   ASSESSMENT/PLAN:   Lower extremity edema Etiology unknown at this time. Low susupicion for DVT, CHF, COPD exacerbation or thyroid derangements. CBC, CMP, TSH, BNP wnl. High suspicion of dependent edema, acquired lipodystropy, venous insufficiency.  -Start wearing compression stockings with ADLs -Switch to Spiriva respimat -Follow up as needed  Hyperlipidemia Lipid panel: tot chol (218), LDL (120), HDL (87). ASCVD risk is 13.6%. Counseled on statin use in prior visit. Will continue to encourage healthy lifestyle on top of adding a statin with the patient is amendable to it this therapy. -Continue to encourage statin use; consider rosuvastatin 20mg  daily  -Continue with smoking cessation  COPD (chronic obstructive pulmonary disease) (HCC) No concerns today. Well controlled. Smoking cessation since October 2020. Will change to Spiriva respimat although edema not likely from inhaler as this usually causes angioedema and not LE edema. -Switch to Spiriva respimat for patient  satisfaction compliance   , DO [facility]

## 2019-05-07 NOTE — Patient Instructions (Addendum)
I will call with blood work results. Start Spiriva Respimat Pick up compression stockings from your local drugstore [facility], [facility], [facility], [facility].  Have a great week  Dr. Salvadore Dom

## 2019-05-08 LAB — CBC
Hematocrit: 39.1 % (ref 34.0–46.6)
Hemoglobin: 13.4 g/dL (ref 11.1–15.9)
MCH: 31.6 pg (ref 26.6–33.0)
MCHC: 34.3 g/dL (ref 31.5–35.7)
MCV: 92 fL (ref 79–97)
Platelets: 277 10*3/uL (ref 150–450)
RBC: 4.24 x10E6/uL (ref 3.77–5.28)
RDW: 11.8 % (ref 11.7–15.4)
WBC: 4.1 10*3/uL (ref 3.4–10.8)

## 2019-05-08 LAB — COMPREHENSIVE METABOLIC PANEL
ALT: 17 IU/L (ref 0–32)
AST: 27 IU/L (ref 0–40)
Albumin/Globulin Ratio: 2 (ref 1.2–2.2)
Albumin: 4.6 g/dL (ref 3.7–4.7)
Alkaline Phosphatase: 103 IU/L (ref 39–117)
BUN/Creatinine Ratio: 11 — ABNORMAL LOW (ref 12–28)
BUN: 8 mg/dL (ref 8–27)
Bilirubin Total: 0.4 mg/dL (ref 0.0–1.2)
CO2: 25 mmol/L (ref 20–29)
Calcium: 10 mg/dL (ref 8.7–10.3)
Chloride: 98 mmol/L (ref 96–106)
Creatinine, Ser: 0.71 mg/dL (ref 0.57–1.00)
GFR calc Af Amer: 98 mL/min/{1.73_m2} (ref >59)
GFR calc non Af Amer: 85 mL/min/{1.73_m2} (ref >59)
Globulin, Total: 2.3 g/dL (ref 1.5–4.5)
Glucose: 88 mg/dL (ref 65–99)
Potassium: 4.2 mmol/L (ref 3.5–5.2)
Sodium: 139 mmol/L (ref 134–144)
Total Protein: 6.9 g/dL (ref 6.0–8.5)

## 2019-05-08 LAB — LIPID PANEL
Chol/HDL Ratio: 2.5 ratio (ref 0.0–4.4)
Cholesterol, Total: 218 mg/dL — ABNORMAL HIGH (ref 100–199)
HDL: 87 mg/dL (ref >39)
LDL Chol Calc (NIH): 120 mg/dL — ABNORMAL HIGH (ref 0–99)
Triglycerides: 61 mg/dL (ref 0–149)
VLDL Cholesterol Cal: 11 mg/dL (ref 5–40)

## 2019-05-08 LAB — BRAIN NATRIURETIC PEPTIDE: BNP: 28.1 pg/mL (ref 0.0–100.0)

## 2019-05-08 LAB — TSH: TSH: 1.38 u[IU]/mL (ref 0.450–4.500)

## 2019-05-09 ENCOUNTER — Telehealth: Payer: Self-pay | Admitting: Family Medicine

## 2019-05-09 NOTE — Assessment & Plan Note (Signed)
Lipid panel: tot chol (218), LDL (120), HDL (87). ASCVD risk is 13.6%. Counseled on statin use in prior visit. Will continue to encourage healthy lifestyle on top of adding a statin with the patient is amendable to it this therapy. -Continue to encourage statin use; consider rosuvastatin 20mg  daily  -Continue with smoking cessation

## 2019-05-09 NOTE — Assessment & Plan Note (Addendum)
Etiology unknown at this time. Low susupicion for DVT, CHF, COPD exacerbation or thyroid derangements. CBC, CMP, TSH, BNP wnl. High suspicion of dependent edema, acquired lipodystropy, venous insufficiency.  -Start wearing compression stockings with ADLs -Switch to Spiriva respimat -Follow up as needed

## 2019-05-09 NOTE — Telephone Encounter (Signed)
Called patient to inform her results. CBC, CMP, and TSH wnl. Lipid panel with increased LDL. Encouraged to start rosuvastatin 20 mg daily. Prescription was filled and picked up by patient. Has not picked up compression stocking because they did not have beige. Will [facility] to [facility] to look for beige ones.   Dr. Salvadore Dom, DO

## 2019-05-09 NOTE — Assessment & Plan Note (Signed)
No concerns today. Well controlled. Smoking cessation since October 2020. Will change to Spiriva respimat although edema not likely from inhaler as this usually causes angioedema and not LE edema. -Switch to Spiriva respimat for patient satisfaction compliance

## 2019-08-31 ENCOUNTER — Other Ambulatory Visit: Payer: Self-pay | Admitting: Family Medicine

## 2019-08-31 DIAGNOSIS — J439 Emphysema, unspecified: Secondary | ICD-10-CM

## 2019-09-04 NOTE — Telephone Encounter (Signed)
Patient calls nurse stating she has one puff for in the am, however after that, she is completely out. Please advise.

## 2019-12-27 ENCOUNTER — Other Ambulatory Visit: Payer: Self-pay | Admitting: Family Medicine

## 2019-12-27 DIAGNOSIS — J439 Emphysema, unspecified: Secondary | ICD-10-CM

## 2020-02-17 NOTE — Progress Notes (Signed)
SUBJECTIVE:   Chief compliant/HPI: annual examination  Kelly Snyder is a 75 y.o. who presents today for an annual exam.   PMH: aortic atherosclerosis, subclavian artery stenosis, COPD, osteoporosis, HLD, tobacco dependence  Current Concerns: Weight gain - concerned about weight gain. She notes that since she quit smoking and started COPD meds. She notes she walks daily. She eats the same amount of food. She is concerned about lipedema. Denies any diarrhea/constipation, heat/cold intolerance. Walks around driveway and house but that is about the physical activity.   HLD: Last lipid panel below. Currently on Crestor 20mg  QD. Denies taking this as her mother died of heart disease when taking this medicine. The 10-year ASCVD risk score DC Jr., et al., 2013) is: 17.9%   Lab Results  Component Value Date   CHOL 218 (H) 05/07/2019   HDL 87 05/07/2019   LDLCALC 120 (H) 05/07/2019   TRIG 61 05/07/2019   CHOLHDL 2.5 05/07/2019   Tobacco use:  Smoked 1 pack/day x 53 years (53 pack year history). Does qualify for lung cancer screen at this time. Quit in October 28, 2018.   COPD: Chronic. Current meds include Spiriva daily and albuterol PRN. Endorses compliance. Denies SOB, worsening productive cough, fevers/chills.   Osteoporosis: Last DEXA scan of -4.5 in 05/02/2017. Current taking calcium and Vitamin D . Has never been on bisphosphonate. Due for repeat DEXA scan for monitoring. Denies history of esophageal disorders or h/o bariatric surgeries.   Health Maintenance: Health Maintenance Due  Topic  . COLONOSCOPY (Pts 45-68yrs Insurance coverage will need to be confirmed)   . PNA vac Low Risk Adult (1 of 2 - PCV13)    Review of systems; see HPI  OBJECTIVE:   BP (!) 154/68   Pulse 76   Ht 5\' 3"  (1.6 m)   Wt 144 lb 9.6 oz (65.6 kg)   SpO2 96%   BMI 25.61 kg/m   Repeat BP: 150/65 General: pleasant older female, sitting comfortably in exam chair, well nourished, well  developed, in no acute distress with non-toxic appearance CV: regular rate and rhythm without murmurs, rubs, or gallops, no lower extremity edema, 2+ radial and pedal pulses bilaterally Lungs: clear to auscultation bilaterally with normal work of breathing on room air, speaking in full sentences Abdomen: soft, non-tender, non-distended, normoactive bowel sounds Skin: warm, dry Extremities: warm and well perfused, normal tone MSK: gait normal Neuro: Alert and oriented, speech normal  ASSESSMENT/PLAN:   Annual Physical Exam: Patient here today for annual physical exam.  PMH, surgical history, and social history were reviewed. The following concerns below were discussed.   Hyperlipidemia Chronic. Noncompliant with medications. Discussed importance of taking this medicine to help reduce risk of CV or stroke. She voiced understanding. Lipid panel to monitor.  History of tobacco abuse Smoked 1 pack/day x 53 years (53 pack year history). Does qualify for lung cancer screen at this time. Quit in October 28, 2018. Congratulated patient on success.  - low dose CT chest for lung cancer screening ordered   Osteoporosis Chronic. T score -4.5 on last DEXA scan in 2019 consistent with severe osteoporosis. Currently taking calcium and vitamin D, never been on bisphosophonate. Discussed risk and benefits of starting Bisphosphonate to reduce risk of fractures. Denies history of esophageal disease or bariatric surgery.  - Will obtain baseline calcium and Vitamin D level.  - Start Alendronate 10mg  QD: take 1 tablet first thin in thte morning on empty stomach with full glass of water.  -  Continue 1000IU Vitamin D daily and Calcium 250mg  TID.  - Repeat DEXA scan in 6-12 months to monitor response.  - Side effect profile including GI upset discussed and encouraged to call if she experiences this.  COPD (chronic obstructive pulmonary disease) (HCC) Chronic. Asymptoamtic. Normal lung exam. - continue  inhalers  Weight gain Chronic over last year. Related to limited physical activity and smoking cessation - scheduled for nutrition clinic  Annual Examination  See AVS for age appropriate recommendations  PHQ score 2, reviewed and discussed.  BP reviewed and elevated. Patient to monitor at home and follow up with PCP in 3-6 months. Asked about intimate partner violence and resources given as appropriate   Considered the following items based upon USPSTF recommendations: Reviewed risk factors for latent tuberculosis and not indicated  Cervical cancer screening: aged out.  Breast cancer screening: Up to date. Due 05/06/21 Colorectal cancer screening: discussed options, elected for cologuard Lung cancer screening: ordered.. See documentation below regarding indications/risks/benefits.  Vaccinations: Due for pneumonia vaccine.   Follow up in 3-6 months for blood pressure check and check up by PCP   07/06/21, DO [facility]

## 2020-02-18 ENCOUNTER — Other Ambulatory Visit: Payer: Self-pay

## 2020-02-18 ENCOUNTER — Ambulatory Visit (INDEPENDENT_AMBULATORY_CARE_PROVIDER_SITE_OTHER): Payer: Medicare Other | Admitting: Family Medicine

## 2020-02-18 ENCOUNTER — Encounter: Payer: Self-pay | Admitting: Family Medicine

## 2020-02-18 VITALS — BP 154/68 | HR 76 | Ht 63.0 in | Wt 144.6 lb

## 2020-02-18 DIAGNOSIS — Z0000 Encounter for general adult medical examination without abnormal findings: Secondary | ICD-10-CM

## 2020-02-18 DIAGNOSIS — R635 Abnormal weight gain: Secondary | ICD-10-CM

## 2020-02-18 DIAGNOSIS — Z1211 Encounter for screening for malignant neoplasm of colon: Secondary | ICD-10-CM

## 2020-02-18 DIAGNOSIS — Z87891 Personal history of nicotine dependence: Secondary | ICD-10-CM | POA: Diagnosis not present

## 2020-02-18 DIAGNOSIS — R030 Elevated blood-pressure reading, without diagnosis of hypertension: Secondary | ICD-10-CM | POA: Diagnosis not present

## 2020-02-18 DIAGNOSIS — M810 Age-related osteoporosis without current pathological fracture: Secondary | ICD-10-CM

## 2020-02-18 DIAGNOSIS — E785 Hyperlipidemia, unspecified: Secondary | ICD-10-CM | POA: Diagnosis not present

## 2020-02-18 DIAGNOSIS — J439 Emphysema, unspecified: Secondary | ICD-10-CM

## 2020-02-18 MED ORDER — ALENDRONATE SODIUM 10 MG PO TABS: 10.0000 mg | ORAL_TABLET | Freq: Every day | ORAL | 2 refills | Status: DC

## 2020-02-18 NOTE — Patient Instructions (Signed)
It was a pleasure to see you today!  Thank you for choosing [facility] for your primary care.   Our plans for today were:  Blood pressure: please check your blood pressure at least 1 time per day in a quite setting after you have been sitting for at least 15 minutes. Please call if you persistently get high numbers (150 or higher on the top and/or 80 or higher on the bottom).  I have ordered you a CT chest for lung cancer screening. Please expect a call to schedule  Please complete the cologuard and send in as instructed  Please start Alendronate 10mg  daily: take on an empty stomach with a full glass of water every day.  Please follow up with Dr. in 3-6 months for blood pressure check   To keep you healthy, please keep in mind the following health maintenance items that you are due for:   1. Pneumonia vaccine   Best Wishes,   Salvadore Dom, DO

## 2020-02-18 NOTE — Assessment & Plan Note (Signed)
Smoked 1 pack/day x 53 years (53 pack year history). Does qualify for lung cancer screen at this time. Quit in October 28, 2018. Congratulated patient on success.  - low dose CT chest for lung cancer screening ordered

## 2020-02-18 NOTE — Assessment & Plan Note (Signed)
Chronic. Asymptoamtic. Normal lung exam. - continue inhalers

## 2020-02-18 NOTE — Assessment & Plan Note (Signed)
Chronic. Noncompliant with medications. Discussed importance of taking this medicine to help reduce risk of CV or stroke. She voiced understanding. Lipid panel to monitor.

## 2020-02-18 NOTE — Assessment & Plan Note (Signed)
Chronic. T score -4.5 on last DEXA scan in 2019 consistent with severe osteoporosis. Currently taking calcium and vitamin D, never been on bisphosophonate. Discussed risk and benefits of starting Bisphosphonate to reduce risk of fractures. Denies history of esophageal disease or bariatric surgery.  - Will obtain baseline calcium and Vitamin D level.  - Start Alendronate 10mg  QD: take 1 tablet first thin in thte morning on empty stomach with full glass of water.  - Continue 1000IU Vitamin D daily and Calcium 250mg  TID.  - Repeat DEXA scan in 6-12 months to monitor response.  - Side effect profile including GI upset discussed and encouraged to call if she experiences this.

## 2020-02-18 NOTE — Assessment & Plan Note (Signed)
Chronic over last year. Related to limited physical activity and smoking cessation - scheduled for nutrition clinic

## 2020-02-19 ENCOUNTER — Encounter: Payer: Self-pay | Admitting: Family Medicine

## 2020-02-19 LAB — BASIC METABOLIC PANEL
BUN/Creatinine Ratio: 14 (ref 12–28)
BUN: 10 mg/dL (ref 8–27)
CO2: 22 mmol/L (ref 20–29)
Calcium: 9.2 mg/dL (ref 8.7–10.3)
Chloride: 98 mmol/L (ref 96–106)
Creatinine, Ser: 0.74 mg/dL (ref 0.57–1.00)
GFR calc Af Amer: 93 mL/min/{1.73_m2} (ref >59)
GFR calc non Af Amer: 81 mL/min/{1.73_m2} (ref >59)
Glucose: 96 mg/dL (ref 65–99)
Potassium: 4 mmol/L (ref 3.5–5.2)
Sodium: 138 mmol/L (ref 134–144)

## 2020-02-19 LAB — LIPID PANEL
Chol/HDL Ratio: 2.5 ratio (ref 0.0–4.4)
Cholesterol, Total: 245 mg/dL — ABNORMAL HIGH (ref 100–199)
HDL: 98 mg/dL (ref >39)
LDL Chol Calc (NIH): 136 mg/dL — ABNORMAL HIGH (ref 0–99)
Triglycerides: 64 mg/dL (ref 0–149)
VLDL Cholesterol Cal: 11 mg/dL (ref 5–40)

## 2020-02-19 LAB — VITAMIN D 25 HYDROXY (VIT D DEFICIENCY, FRACTURES): Vit D, 25-Hydroxy: 40.7 ng/mL (ref 30.0–100.0)

## 2020-02-25 ENCOUNTER — Encounter: Payer: Self-pay | Admitting: Family Medicine

## 2020-02-25 DIAGNOSIS — Z1211 Encounter for screening for malignant neoplasm of colon: Secondary | ICD-10-CM | POA: Diagnosis not present

## 2020-02-25 DIAGNOSIS — Z1212 Encounter for screening for malignant neoplasm of rectum: Secondary | ICD-10-CM | POA: Diagnosis not present

## 2020-03-03 ENCOUNTER — Ambulatory Visit
Admission: RE | Admit: 2020-03-03 | Discharge: 2020-03-03 | Disposition: A | Discharge status: Home / Self Care | Payer: Medicare Other | Source: Ambulatory Visit | Attending: Family Medicine | Admitting: Family Medicine

## 2020-03-03 ENCOUNTER — Other Ambulatory Visit: Payer: Self-pay

## 2020-03-03 DIAGNOSIS — Z87891 Personal history of nicotine dependence: Secondary | ICD-10-CM

## 2020-03-03 DIAGNOSIS — J984 Other disorders of lung: Secondary | ICD-10-CM | POA: Diagnosis not present

## 2020-03-03 DIAGNOSIS — I2510 Atherosclerotic heart disease of native coronary artery without angina pectoris: Secondary | ICD-10-CM | POA: Diagnosis not present

## 2020-03-03 DIAGNOSIS — J432 Centrilobular emphysema: Secondary | ICD-10-CM | POA: Diagnosis not present

## 2020-03-05 LAB — COLOGUARD: Cologuard: NEGATIVE

## 2020-03-06 ENCOUNTER — Telehealth: Payer: Self-pay

## 2020-03-06 NOTE — Telephone Encounter (Signed)
Patient LVM on nurse line stating that she is returning phone call to provider. Patient is requesting returned phone call after 4 pm, due to work.   Unable to find note regarding missed call. Please let me know if there is a message that I can relay.   Veronda Prude, RN

## 2020-03-20 ENCOUNTER — Encounter: Payer: Self-pay | Admitting: Family Medicine

## 2020-03-20 NOTE — Telephone Encounter (Signed)
Spoke to patient and discussed all lab results.

## 2020-04-24 ENCOUNTER — Other Ambulatory Visit: Payer: Self-pay

## 2020-04-24 ENCOUNTER — Ambulatory Visit (INDEPENDENT_AMBULATORY_CARE_PROVIDER_SITE_OTHER): Payer: Medicare Other | Admitting: Family Medicine

## 2020-04-24 ENCOUNTER — Encounter: Payer: Self-pay | Admitting: Family Medicine

## 2020-04-24 VITALS — Ht 63.0 in | Wt 146.4 lb

## 2020-04-24 DIAGNOSIS — Z713 Dietary counseling and surveillance: Secondary | ICD-10-CM | POA: Diagnosis not present

## 2020-04-24 DIAGNOSIS — R635 Abnormal weight gain: Secondary | ICD-10-CM

## 2020-04-24 NOTE — Progress Notes (Signed)
Appt start time: 0930 end time: 10:30 (1 hour)  What do you want to get out of meeting with me today? Stop gaining weight  Relevant history/background: Prior smoker (53 pack year history), quit in 2020. COPD. Osteoporosis (T score -4.5) Weight gain of 110lb to 146.4lb from Oct 2020 to 04/2020  Assessment:  On a normal day, how many meals do you have? 2 meals How many snacks do you eat per day? 0-1 What beverages do you drink most days of the week? Coffee and water, occasional sweet tea What foods do you eat most days of the week? Ham/cheese on [place] roll, tossed salads Usual physical activity: minimal (walk to mailbox) Sleep: Patient estimates average of 5-6 hours of sleep/night.  24-hr recall:  (Up at 5:15 AM) B (5:20 AM)-  Coffee w/ 1/2 tsb sugar, 1tsb whole milk x 2-3 Snk ( AM)-   L (2:30 PM)-  Pack of PB nabs (6/pack) Snk (5 PM)-  2-3 grapes  D (7 PM)-  1 C pinto beans + 1 pie piece of corn bread w/ 1/2 tsb butter + Coffee w/ 1/2 tsb sugar, 1tsb whole milk  Snk ( PM)-   Typical day? No.   - Usually around 12p, have Ham (2-3 thin slice ham, 1/2 T mayo, 1 slice american cheese on 6-in [place] roll)  -  tossed salad as a snack before dinner (vegetables + 1-2oz shredded cheese + 1 T Cataline [place] dressing)  ~2x per week will have cooked or fresh vegetables  Lifestyle changes contributing to weight: - smoking cessation - decreased physical activity through COVID  Intervention: Completed diet and exercise history, and established: - SMART behavioral goals (Smart Goals: Specific, Measurable, Action-oriented, Realistic, Time-based.)    1. 3 real meals per day and 1-2 snacks per day. Goal of eating something within 1 hour of waking up.    2. Goal of 2-3 servings of vegetables and fruits per day   3. Goal to walk at least 30 minutes, three days a week.  - How will you document progress on goals?  Goals sheet given  Future goals: improve sleep hygiene, Consider referral to Pulmonary  rehab for improvement in dyspnea with exertion   For recommendations and goals, see Patient Instructions.    Follow-up: 05/22/20 in nutrition clinic for nutrition follow up  [employer]

## 2020-04-24 NOTE — Patient Instructions (Addendum)
GOAL: I will eat at least 3 REAL meals and 1 snack per day.  Eat breakfast within one hour of getting up.  Aim for no more than 5 hours between eating.   A REAL meal includes at least some protein, some starch, and vegetables and/or fruit.   (OR: Would you serve this to a guest in your home, and call it a meal?)  Foods to try: - Yogurt with berries - Small whole wheat sandwich  - Mixed nuts, peanut butter, fruit (bananas, kiwi, berries, apple), vegetables (carrots, celery, broccoli, brussels)   Protein foods: Meat, fish, poultry, eggs, dairy foods, and beans such as pinto and kidney beans (beans also provide carbohydrate).   GOAL: I will walk at least 30 minutes, three days a week.   GOAL: I will include vegetables at lunch or dinner at least 10 meals a week.    Goals: 1. I will walk at least 30 minutes, three days a week. 2. I will eat at least 3 REAL meals and 1 snack per day, with breakfast within one hour of getting up and  no more than 5 hours between eating.   3. I will include vegetables at lunch or dinner at least 10 meals a week.    Week of __________ Goals Sunday Monday Tuesday Wednesday Thursday Friday Saturday   Walk 30 min         3 meals a day         Vegetables

## 2020-04-29 ENCOUNTER — Other Ambulatory Visit: Payer: Self-pay | Admitting: Family Medicine

## 2020-04-29 DIAGNOSIS — J439 Emphysema, unspecified: Secondary | ICD-10-CM

## 2020-05-22 ENCOUNTER — Ambulatory Visit: Payer: Medicare Other | Admitting: Family Medicine

## 2020-05-27 ENCOUNTER — Other Ambulatory Visit: Payer: Self-pay

## 2020-05-27 ENCOUNTER — Ambulatory Visit (INDEPENDENT_AMBULATORY_CARE_PROVIDER_SITE_OTHER): Payer: Medicare Other | Admitting: Family Medicine

## 2020-05-27 VITALS — BP 122/60 | HR 99 | Ht 63.0 in | Wt 147.8 lb

## 2020-05-27 DIAGNOSIS — R635 Abnormal weight gain: Secondary | ICD-10-CM | POA: Diagnosis not present

## 2020-05-27 DIAGNOSIS — R030 Elevated blood-pressure reading, without diagnosis of hypertension: Secondary | ICD-10-CM

## 2020-05-27 DIAGNOSIS — Z0000 Encounter for general adult medical examination without abnormal findings: Secondary | ICD-10-CM | POA: Diagnosis not present

## 2020-05-27 NOTE — Patient Instructions (Addendum)
It was wonderful to see you today.  Please bring ALL of your medications with you to every visit.   Today we talked about:  Your blood pressure. Lets follow up in 2 weeks for a recheck.  Be sure to schedule your mammogram and dexa scan.  Please be sure to schedule follow up at the front  desk before you leave today.   If you haven't already, sign up for My Chart to have easy access to your labs results, and communication with your primary care physician.  Please call the clinic at [phone] if your symptoms worsen or you have any concerns. It was our pleasure to serve you.  Dr. Salvadore Dom

## 2020-05-27 NOTE — Progress Notes (Signed)
    SUBJECTIVE:   CHIEF COMPLAINT / HPI:   Kelly Snyder is a 75 yo F who presents for the following:  Weight gain Concern for weight gain over the last 2 years. Feels the swelling in her legs are not normal. Eats the same amount that she has been eating. Was told by a lady in the grocery store that weight gain could be due to her hormones.   Elevated BP   Felt her blood pressure rising in the waiting room due to an incident with another patient yelling in the hallway. This was upsetting to her. Denies headache, vision changes, or chest palpitations.   Health care maintenance Needs mammogram and dexa scan.   PERTINENT  PMH / PSH: COPD (Former smoker; quit 2021), Osteoporosis, HLD  OBJECTIVE:   BP 122/60   Pulse 99   Ht 5\' 3"  (1.6 m)   Wt 147 lb 12.8 oz (67 kg)   SpO2 97%   BMI 26.18 kg/m   General: Appears well, no acute distress. Age appropriate. Cardiac: RRR, normal heart sounds, no murmurs Respiratory: CTAB, normal effort Extremities: No LE edema or cyanosis. Medial thigh cellulitis present bilaterally.  ASSESSMENT/PLAN:  Elevated blood pressure reading Initially 132/80 but improved by end of visit. Does not take any anti-hypertensive; will not start at this time. Recent stressful event. Will continue to monitor with subsequent visits.  Healthcare maintenance -Schedule mammogram and dexa scan  Weight gain 17 lbs weight gain in 1 year. Very concerning for the patient. Recent visits with Dr. for physical exam and nutrition (w/ Dr. Mauri Reading). Given goals for diet and exercise and has scheduled follow up. Last TSH, BNP 05/2019 were unremarkable to consider cardio or thyroid causes. No LE edema at this time but has cellulitis that is distressing to the patient. Discussed gradual goal setting and continued nutrition visit to address this concern.   06/2019, DO [facility]

## 2020-05-28 ENCOUNTER — Encounter: Payer: Self-pay | Admitting: Family Medicine

## 2020-06-10 ENCOUNTER — Telehealth: Payer: Self-pay | Admitting: *Deleted

## 2020-06-10 DIAGNOSIS — M810 Age-related osteoporosis without current pathological fracture: Secondary | ICD-10-CM

## 2020-06-10 DIAGNOSIS — Z1231 Encounter for screening mammogram for malignant neoplasm of breast: Secondary | ICD-10-CM

## 2020-06-10 NOTE — Telephone Encounter (Signed)
Dexa scan order placed. Oversight that it was not ordered at last visit. Thank you.   Devra Stare Autry-Lott, DO 06/10/2020, 11:10 AM PGY-2, [facility]

## 2020-06-10 NOTE — Telephone Encounter (Signed)
Patient left a message on the referral line stating that she called to schedule her mammogram and bone density as advised at her last visit, but the breast center didn't have any order on file.  Will forward to MD to please place these orders for patient so she can call and schedule the appointments.    Thanks [employer]

## 2020-06-10 NOTE — Addendum Note (Signed)
Addended by: Lavonda Jumbo E on: 06/10/2020 11:10 AM   Modules accepted: Orders

## 2020-06-10 NOTE — Telephone Encounter (Signed)
Can you place the order for the DEXA scan?  Patient will call them tomorrow to get them scheduled the same day.  It was written in your last note that she needed it.  Yetunde Leis,CMA

## 2020-06-10 NOTE — Telephone Encounter (Signed)
Ordered mammogram in this encounter. Thank you for your help with this patient.   Alanys Godino Autry-Lott, DO 06/10/2020, 10:32 AM PGY-2, [facility]

## 2020-06-11 NOTE — Progress Notes (Signed)
Appt start time: 0930 end time: 10:30 (1 hour)  What do you want to get out of meeting with me today? Stop gaining weight  Relevant history/background: Prior smoker (53 pack year history), quit in 2020.  COPD. Osteoporosis (T score -4.5) Weight gain of 110lb to 146.4lb from Oct 2020 to 04/2020  Assessment:   Recent eating pattern: getting 2-3 meals and snacks 0-1 per day?   Recent physical activity:walk to mailbox, ; walking around the home, vacuuming   Sleep: Patient estimates average of 6-8 hours of sleep/night.   24-hr recall:  (Up at 515) B (0600)- banana, 1 cup coffee 1/2 tsp sugar, 1tsp milk   Snk ( AM)- 1 oatmeal cookie  L ( 1230)-  6-8 in sub sandwich; ham & 1 slice  american cheese, mustard, mayo, pickles, lettuce, 1 cup coffee 1/2 tsp sugar, 1tsp milk, water   Snk (1600)- 1 activia strawberry yogurt  D (1930)- 1/2C black eyed peas, 1/2 cup snap peas, 1 cup stewed apples (prepared with 1tbsp brown sugar/5 apples and cinnamon), 1/2 c butter peas   Snk ( PM)-  Typical day? Yes.     Lifestyle changes contributing to weight: - smoking cessation - decreased physical activity through COVID  Intervention: Completed diet and exercise history, and established:  - SMART behavioral goals (Smart Goals: Specific, Measurable, Action-oriented, Realistic, Time-based.)    1. 3 real meals per day and 1-2 snacks per day; with a real meal including a source of protein & a carbohydrate with fruit and or vegetable.  Goal of eating something within 1 hour of waking up.    2. Goal of 2-3 servings of vegetables and fruits per day   3. Commercial break standing from seated at least once daily in combination with walking for 30 minutes per day for 3 days per week.   - How will you document progress on goals?  Goals sheet given  -discussed beans to 1 C for protein source  -try eggs, yogurt for breakfast  -plain oatmeal and add your own fruit  -standing with commercial breaks   Future  goals: improve sleep hygiene, Consider referral to Pulmonary rehab for improvement in dyspnea with exertion   For recommendations and goals, see Patient Instructions.    Follow-up: 07/10/2020 930 AM   Sherrol Vicars Simmons-Robinson

## 2020-06-12 ENCOUNTER — Ambulatory Visit (INDEPENDENT_AMBULATORY_CARE_PROVIDER_SITE_OTHER): Payer: Medicare Other | Admitting: Family Medicine

## 2020-06-12 ENCOUNTER — Other Ambulatory Visit: Payer: Self-pay

## 2020-06-12 ENCOUNTER — Ambulatory Visit: Payer: Medicare Other

## 2020-06-12 ENCOUNTER — Encounter: Payer: Self-pay | Admitting: Family Medicine

## 2020-06-12 VITALS — Ht 63.0 in | Wt 150.8 lb

## 2020-06-12 DIAGNOSIS — R635 Abnormal weight gain: Secondary | ICD-10-CM | POA: Diagnosis not present

## 2020-06-12 NOTE — Patient Instructions (Addendum)
GOALS    1. 3 real meals per day and 1-2 snacks per day; with a real meal including a source of protein & a carbohydrate with fruit and or vegetable.  Goal of eating something within 1 hour of waking up.   2. Goal of 2-3 servings of vegetables and fruits per day   3. Commercial break standing from seated at least once daily in combination with walking for 30 minutes per day for 3 days per week.   Document on your goals sheets for you next visit and bring these sheets to your next visit.

## 2020-07-10 ENCOUNTER — Ambulatory Visit: Payer: Medicare Other | Admitting: Family Medicine

## 2020-07-24 ENCOUNTER — Other Ambulatory Visit: Payer: Self-pay

## 2020-07-24 ENCOUNTER — Encounter: Payer: Self-pay | Admitting: Family Medicine

## 2020-07-24 ENCOUNTER — Ambulatory Visit (INDEPENDENT_AMBULATORY_CARE_PROVIDER_SITE_OTHER): Payer: Medicare Other | Admitting: Family Medicine

## 2020-07-24 VITALS — Ht 63.0 in | Wt 150.6 lb

## 2020-07-24 DIAGNOSIS — R635 Abnormal weight gain: Secondary | ICD-10-CM

## 2020-07-24 NOTE — Progress Notes (Signed)
Appt start time: 0930 end time: 10:45 AM (1 hour)  What do you want to get out of meeting with me today? Weight loss and improving image of my body   Relevant history/background:  Prior smoker (53 pack year history), quit in 2020.  COPD.  Osteoporosis (T score -4.5) Weight gain of 110lb to 146.4lb from Oct 2020 to 04/2020  Assessment:   Recent eating pattern: getting 2-3 meals and at least one snack per day  Recent physical activity: standing during commercial breaks, walking to mailbox and back to the house  for 30 mins total   Sleep: Patient estimates average of 6 hours of sleep/night. (Reports some naps throughout the day)  24-hr recall:   (Up at 0530 AM) B (0800 AM)- 1 banana, 1 cup coffee w/ 1 tsp milk, 1/2 tsp sugar   Snk ( AM)-  none    L (1215 PM)-  Baked chicken 2 strips, 2-3 oz, salad w/ 2 tbsp shredded chz, 1 tbsp catalina drsg, 2 club crackles, 8-10 blueberries  Snk ( ? PM)-  Blueberries, 1 oatmeal cookie  D (1930 PM)-  Salad w/ 1oz chz, croutons, 1tbsp catalina  Snk ( 2100 PM)-  6 peanut nabs  Typical day? Yes.    Lifestyle changes contributing to weight: - smoking cessation - decreased physical activity through COVID  Intervention: Completed diet and exercise history, and established:  - Previous behavioral goals   1. 3 real meals per day and 1-2 snacks per day; with a real meal including a source of protein & a carbohydrate with fruit and or vegetable.  Goal of eating something no later than 9 AM.    2. Goal of 20 g of protein per meal (including kidney beans, pinto beans, whole eggs, milk/dairy)   3. Commercial break standing from seated at least once daily in combination with walking for 30 minutes per day for at least 4 days per week.   - How will you document progress on goals?  Goals sheet given  -discussed beans to 1 C for protein source to count as meal portion  -try eggs, yogurt, lower percent fat milk, organic protein powder 1/2 package with 9 oz milk   -standing with commercial breaks & walking in cool locations for 30 mins 4 days per week  -patient given organic protein samples, chocolate powder   Desired outcome goals: improve sleep hygiene, Consider referral to Pulmonary rehab for improvement in dyspnea with exertion   For recommendations and goals, see Patient Instructions.    Follow-up: 09/11/20 at 9:30 AM   [facility] Simmons-Robinson

## 2020-07-24 NOTE — Patient Instructions (Addendum)
   1. 3 real meals per day and 1-2 snacks per day; with a real meal including a source of protein & a carbohydrate with fruit and or vegetable.  Goal of eating something no later than 9 AM.    2. Goal of 20 g of protein per meal (including kidney beans, pinto beans, whole eggs, milk/dairy)   3. Commercial break standing from seated at least once daily in combination with walking for 30 minutes per day for at least 4 days per week.   Document on goals sheet provided today.   1 oz of meat/fish/chicken/cheese/1 full egg/1 cup of milk= 7 grams of protein   1 cup of beans is about 15 grams of protein   You will need at least 1 cup of beans included in meals to count as true portion of protein.   Follow up: 09/11/20 at 0930 AM    Dr. Gerilyn Pilgrim [phone]

## 2020-08-05 ENCOUNTER — Ambulatory Visit
Admission: RE | Admit: 2020-08-05 | Discharge: 2020-08-05 | Disposition: A | Discharge status: Home / Self Care | Payer: Medicare Other | Source: Ambulatory Visit | Attending: Family Medicine | Admitting: Family Medicine

## 2020-08-05 ENCOUNTER — Other Ambulatory Visit: Payer: Self-pay

## 2020-08-05 DIAGNOSIS — Z1231 Encounter for screening mammogram for malignant neoplasm of breast: Secondary | ICD-10-CM

## 2020-08-22 ENCOUNTER — Other Ambulatory Visit: Payer: Self-pay | Admitting: Family Medicine

## 2020-08-22 DIAGNOSIS — J439 Emphysema, unspecified: Secondary | ICD-10-CM

## 2020-09-11 ENCOUNTER — Ambulatory Visit: Payer: Medicare Other | Admitting: Family Medicine

## 2020-09-11 ENCOUNTER — Encounter: Payer: Self-pay | Admitting: Family Medicine

## 2020-09-11 NOTE — Progress Notes (Unsigned)
Weight today: 153.0 lb.  Pt is frustrated that weight keeps going up despite dietary change and some exercise.  I feel her dietary and physical activity efforts, while meaningful, are as yet insufficient to reverse muscle loss started during the pandemic (2020) and accelerated with hospitalization for pneumonia in Oct 2020.   Pt quit smoking that year, and started on Spireva, and has gained 35 lb since then.    Knee pain is currently limiting exercise ability.

## 2020-09-15 ENCOUNTER — Telehealth: Payer: Self-pay | Admitting: Family Medicine

## 2020-09-15 NOTE — Telephone Encounter (Signed)
Received message from Dr. Gerilyn Pilgrim that patient could benefit from a follow up visit. Called patient to discuss this. Having knee pain that is keeping her from walking as much as she would like. Concern that insurance will not pay for physical therapy services. She plans to call clinic to schedule a visit and call insurance company to see if these services will be covered.   Kelly Andres Autry-Lott, DO 09/15/2020, 11:57 AM PGY-3, [facility]

## 2020-10-17 ENCOUNTER — Ambulatory Visit (INDEPENDENT_AMBULATORY_CARE_PROVIDER_SITE_OTHER): Payer: Medicare Other | Admitting: Family Medicine

## 2020-10-17 ENCOUNTER — Encounter: Payer: Self-pay | Admitting: Family Medicine

## 2020-10-17 ENCOUNTER — Other Ambulatory Visit: Payer: Self-pay

## 2020-10-17 VITALS — BP 152/80 | HR 98 | Ht 63.0 in | Wt 157.2 lb

## 2020-10-17 DIAGNOSIS — M25569 Pain in unspecified knee: Secondary | ICD-10-CM | POA: Diagnosis not present

## 2020-10-17 DIAGNOSIS — G8929 Other chronic pain: Secondary | ICD-10-CM

## 2020-10-17 NOTE — Progress Notes (Addendum)
    SUBJECTIVE:   CHIEF COMPLAINT / HPI:   Ms. Kelly Snyder is a 75 yo F who presents for the below.   Right knee pain Has been bothering her for months.  She was recently seen by Dr. Gerilyn Pilgrim and given exercises to do in which knee pain has increased.  She denies any falls or trauma to her right knee.  She denies hearing any audible snap or pop.  She has tried ice and heat as well as elevation.  She is currently interested in physical therapy.  PERTINENT  PMH / PSH: As above.  OBJECTIVE:   BP (!) 152/80   Pulse 98   Wt 157 lb 3.2 oz (71.3 kg)   SpO2 94%   BMI 27.85 kg/m   General: Appears well, no acute distress. Age appropriate. Extremities: No gross deformity, ecchymoses, swelling. No TTP. FROM with normal strength. Negative ant/post drawers. Negative valgus/varus testing. Negative lachman.  Negative mcmurrays, apleys.  NV intact distally.   ASSESSMENT/PLAN:   Chronic knee pain, unspecified laterality Endorsing knee pain with increase in physical activity.  Exam without concerning findings today likely osteoarthritis without history of trauma.  Patient is actively trying to lose weight and was contacted by nutritionist for referral to physical therapy.  Patient is on board with referral to physical therapy with a goal to become more mobile to lose weight. - Ambulatory referral to Physical Therapy    Lavonda Jumbo, DO [facility]

## 2020-10-17 NOTE — Patient Instructions (Addendum)
We have referred you to physical therapy for your knee pain. The goal is to become more mobile with your lifestyle changes to lose weight. Please keep follow up with Dr. Gerilyn Pilgrim.   Dr. Salvadore Dom

## 2020-10-30 ENCOUNTER — Ambulatory Visit (INDEPENDENT_AMBULATORY_CARE_PROVIDER_SITE_OTHER): Payer: Medicare Other | Admitting: Family Medicine

## 2020-10-30 ENCOUNTER — Other Ambulatory Visit: Payer: Self-pay

## 2020-10-30 VITALS — Ht 63.0 in

## 2020-10-30 DIAGNOSIS — E785 Hyperlipidemia, unspecified: Secondary | ICD-10-CM | POA: Diagnosis not present

## 2020-10-30 DIAGNOSIS — M810 Age-related osteoporosis without current pathological fracture: Secondary | ICD-10-CM | POA: Diagnosis not present

## 2020-10-30 DIAGNOSIS — R635 Abnormal weight gain: Secondary | ICD-10-CM

## 2020-10-30 NOTE — Progress Notes (Addendum)
Nutrition Clinic note  Relevant history/background:  Prior smoker (53 pack year history), quit in 2020.  COPD.  Osteoporosis (T score -4.5) Weight gain of 110lb to 146.4lb from Oct 2020 to 04/2020  Assessment: Patient doing well tracking goals, but is not feeling any better.   Recent eating pattern:  getting 2 meals in a day (limited by lack of desire to eat), 1 snack  Recent physical activity: walking (but her knees hurt), standing during commercial breaks Walking: 10 minutes to mail box (and 10 minutes back), extra 10 minutes to walk to sister's house Standing during commercial breaks: Maybe 10-30 times in the day  Sleep: Not good sleep, wakes up quite a bit (maybe 4-5 times per night, has to get up and try to get sleepy again), falls asleep during the day while trying to watch tv Previously: Patient estimates average of 6 hours of sleep/night. (Reports some naps throughout the day)  24-hr recall:  (Up at 0515) B (0630)- 1 c coffee with 1/2 tsp sugar and 1/2 tsp milk, 1 club cracker with peanut butter spread, 2 c bowel of cereal (Special K), 1/2 c whole milk Snk (1000)-  1 Oatmeal cookie  L ()-   Sub sandwich with ham, cheese, tomato, lettuce, 1 tsp mustard,1 tsp mayonnaise; water Snk ()-   D (1900)-  2 slices of medium cheese pizza (domino's), water Snk ()-  Typical day? Yes  Total daily intake of coffee: 3-4 c coffee with 1/2 tsp sugar and 1/2 tsp milk each Other snacks: Peanut butter nabs, grapes, kiwi  Intervention: Completed diet and exercise history, and established:  - Previous behavioral goals   1. 3 real meals per day and 1-2 snacks per day; with a real meal including a source of protein & a carbohydrate with fruit and or vegetable.  Goal of eating something no later than 9 AM.    2. Goal of 20 g of protein per meal (including kidney beans, pinto beans, whole eggs, milk/dairy)   3. Commercial break standing from seated at least once daily in combination with walking  for 30 minutes per day for at least 4 days per week.   - How will you document progress on goals?  Goals sheet given  -discussed beans to 1 C for protein source to count as meal portion  -try eggs, yogurt, lower percent fat milk, organic protein powder 1/2 package with 9 oz milk  -standing with commercial breaks & walking in cool locations for 30 mins 4 days per week  -patient given organic protein samples, chocolate powder   Desired outcome goals: improve sleep hygiene, Consider referral to Pulmonary rehab for improvement in dyspnea with exertion   For recommendations and goals, see Patient Instructions.    Follow-up: Call for appointment with Dr. Gerilyn Pilgrim after physical therapy  Fayette Pho

## 2020-10-30 NOTE — Patient Instructions (Signed)
It was wonderful to meet you today. Thank you for allowing me to be a part of your care. Below is a short summary of what we discussed at your visit today:  Physical therapy Please call  [phone] to make an appointment with the [facility]. You have been referred there but they are having difficulty reaching you by phone to make an appointment.   [facility] at [facility] [address] [place], [place], [place] [address] [phone]  Make sure to keep up these exercises at home as well. This helps to increase muscle mass and stabilize the knees and other joints too.   Nutrition Goals - continue with current goals   1. 3 real meals per day and 1-2 snacks per day; with a real meal including a source of protein & a carbohydrate with fruit and or vegetable.  Goal of eating something no later than 9 AM.    2. Goal of 20 g of protein per meal (including kidney beans, pinto beans, whole eggs, milk/dairy)   3. Commercial break standing from seated at least once daily in combination with walking for 30 minutes per day for at least 4 days per week.   Notes:  - Count vegetables only in the vegetable column. - Eat more vegetables! No one will ever gain weight eating more vegetables.  - Continue increasing your exercise (more sit-stands, longer walks)   If you have any questions or concerns, please do not hesitate to contact us via phone or MyChart message.   Fayette Pho, MD

## 2020-11-05 ENCOUNTER — Other Ambulatory Visit: Payer: Self-pay

## 2020-11-05 ENCOUNTER — Ambulatory Visit: Payer: Medicare Other | Attending: Family Medicine

## 2020-11-05 DIAGNOSIS — R262 Difficulty in walking, not elsewhere classified: Secondary | ICD-10-CM | POA: Insufficient documentation

## 2020-11-05 DIAGNOSIS — M6281 Muscle weakness (generalized): Secondary | ICD-10-CM | POA: Diagnosis present

## 2020-11-05 DIAGNOSIS — M25569 Pain in unspecified knee: Secondary | ICD-10-CM | POA: Diagnosis not present

## 2020-11-05 DIAGNOSIS — M25561 Pain in right knee: Secondary | ICD-10-CM | POA: Diagnosis present

## 2020-11-05 DIAGNOSIS — M25562 Pain in left knee: Secondary | ICD-10-CM | POA: Diagnosis present

## 2020-11-05 DIAGNOSIS — G8929 Other chronic pain: Secondary | ICD-10-CM | POA: Diagnosis present

## 2020-11-05 NOTE — Therapy (Addendum)
[facility] [address] [place], [place], [address] Phone: [phone]   Fax:  [phone]  Physical Therapy Evaluation  Patient Details  Name: Kelly Snyder MRN: [id number] Date of Birth: Oct 14, 1945 Referring Provider (PT): Moses Manners   Encounter Date: 11/05/2020   PT End of Session - 11/05/20 1016     Visit Number 1    Number of Visits 13    Date for PT Re-Evaluation 12/17/20    Authorization Type [employer] [facility]    Progress Note Due on Visit 10    PT Start Time 0930    PT Stop Time 1018    PT Time Calculation (min) 48 min    Activity Tolerance Patient tolerated treatment well    Behavior During Therapy [facility] for tasks assessed/performed             Past Medical History:  Diagnosis Date   Arthritis    COPD (chronic obstructive pulmonary disease) (HCC)    Pneumonia     Past Surgical History:  Procedure Laterality Date   DILATION AND CURETTAGE OF UTERUS  1970    There were no vitals filed for this visit.    Subjective Assessment - 11/05/20 0932     Subjective Pt says she put on 35lbs since quitting smoking and became inactive in 2020 and believes this is what has caused her knees to start hurting. Pt says that "everything hurts a little bit" but that she is not sure if it is old age, the weight, or something else. Pt says she hasn't started gaining weight until she quit smoking but that has not changed her diet either and claims she "eats less" than what she did when she smoked. Pt reports she is seeing a nutritionist who believes that she just needs to keep moving and that "physical therapy will help with losing weight".  Pt reports the nutritionist has also instructed her to do sit to stands, but that "these hurt her knees". Pt says her pain is always around, but that she doesn't really notice it unless she gets sharp pain from doing alot of activity. Pt reports both knees bother her and believes it is "due to fluid  around the knee joint". Pt says she is going to get a bone density test on 11/30/20. Pt denies any popping or clicking in knee joints.    Pertinent History COPD, quit smoking in 2020, Lt wrist fracture in 2019    Limitations Walking;Standing;Sitting    How long can you sit comfortably? 3 hours    How long can you stand comfortably? couple hours    How long can you walk comfortably? 10-59mins    Diagnostic tests None    Patient Stated Goals "feeling better and losing weight"    Currently in Pain? Yes    Pain Score 3     Pain Location Knee    Pain Orientation Right;Left    Pain Descriptors / Indicators Throbbing;Tightness    Pain Type Chronic pain    Pain Onset More than a month ago    Pain Frequency Constant    Aggravating Factors  walking, sit to stand    Pain Relieving Factors heat, cold, elevation, OTC pain relievers (not often)    Effect of Pain on Daily Activities Pt reports pain does not affect daily activities             [facility] Adult PT Treatment/Exercise:  Therapeutic Exercise: - NA  Manual Therapy: - NA  Neuromuscular re-ed: - NA   Therapeutic Activity: - NA  Self-care/Home Management: - Pt instructed on diagnosis, prognosis, pathology, and POC. Pt educated on exercise for weight loss and impact of weight gain on joints. Pt instructed on HEP and given handout.     [facility] PT Assessment - 11/05/20 0001       Assessment   Medical Diagnosis M25.569,G89.29 (ICD-10-CM) - Chronic knee pain, unspecified laterality    Referring Provider (PT) Moses Manners    Onset Date/Surgical Date --   2020   Hand Dominance Right    Next MD Visit not set up    Prior Therapy no      Precautions   Precautions None      Restrictions   Weight Bearing Restrictions No      Balance Screen   Has the patient fallen in the past 6 months No    Has the patient had a decrease in activity level because of a fear of falling?  No    Is the patient reluctant to leave their home  because of a fear of falling?  No      Home Environment   Living Environment Private residence    Living Arrangements Children    Type of Home House    Home Access Stairs to enter   3   Entrance Stairs-Number of Steps 3    Entrance Stairs-Rails Right    Home Layout One level    Home Equipment None      Prior Function   Level of Independence Independent    Vocation Retired    Leisure Sales promotion account executive   Overall Cognitive Status Within Functional Limits for tasks assessed      Observation/Other Assessments   Focus on Therapeutic Outcomes (FOTO)  57% function, 62% predicted      Observation/Other Assessments-Edema    Edema --      Coordination   Gross Motor Movements are Fluid and Coordinated Yes      Sit to Stand   Comments Knee valgus, significant trunk lean   pain in R knee     Posture/Postural Control   Posture Comments Unremarkable      AROM   Overall AROM  Within functional limits for tasks performed   no pain provoked   AROM Assessment Site Hip;Knee      PROM   Overall PROM  Within functional limits for tasks performed   no pain provoked     Strength   Right Hip Flexion 3+/5    Right Hip External Rotation  3+/5    Right Hip Internal Rotation 3+/5    Left Hip Flexion 4-/5    Left Hip External Rotation 4-/5    Left Hip Internal Rotation 4-/5    Right Knee Flexion 3+/5    Right Knee Extension 3+/5    Left Knee Flexion 4-/5    Left Knee Extension 4-/5      Flexibility   Soft Tissue Assessment /Muscle Length yes    Hamstrings WFL bilaterally, soft tissue stretch end feel, greater range on Rt verses Lt.      Palpation   Patella mobility Rt hypermobile medial and lateral,  non painful crepitus with inferior and superior glides.    Palpation comment Rt muscle tightness and tenderness present in distal hamstring attachments medial and lateral, tenderness along joint line.      Special Tests   Other special tests anterior drawer (-), posterior drawer  (-),  [place] (-)                        Objective measurements completed on examination: See above findings.                PT Education - 11/05/20 1038     Education Details Pt educated on diagnosis, pathology, prognosis, and POC. Pt instructed on benefits of exercise and relationship between weight gain and knee pain. Pt instructed HEP and given handout.    Person(s) Educated Patient    Methods Explanation;Demonstration;Handout    Comprehension Verbalized understanding              PT Short Term Goals - 11/05/20 1132       PT SHORT TERM GOAL #1   Title Pt able to report independance with HEP to improve weight loss and improve strength.    Baseline None    Status New    Target Date 11/26/20      PT SHORT TERM GOAL #2   Title Pt able to demonstrate proper sit to stand mechanics without pain to improve transfers.    Baseline see flowsheet    Status New    Target Date 11/26/20      PT SHORT TERM GOAL #3   Title Physical therapist will assess 6 minute walk test to create goals for lower extrimity endurance, activitiy tolerance, and pain tolerance.    Baseline not assessed    Status New    Target Date 11/26/20               PT Long Term Goals - 11/05/20 1137       PT LONG TERM GOAL #1   Title Pt able to achieve predicted FOTO score of 62% to indicated clinically meaningful improvement of function.    Baseline 57%    Status New    Target Date 12/17/20      PT LONG TERM GOAL #2   Title Pt able to improve knee flexion and extension manual muscle tests to 4+/5 on Rt to improve knee stability and sit to stand function.    Baseline see flowsheet    Status New    Target Date 12/17/20      PT LONG TERM GOAL #3   Title Pt will be able to increase hip MMTs bilaterally to 4+/5 to improve knee stability and pain in standing and walking.    Baseline see flowsheet    Status New    Target Date 12/17/20                    Plan -  11/05/20 1044     Clinical Impression Statement Pt presents to O[facility] with chief complaint of Rt knee pain secondary to change in weight (gaining 35lbs over 2 years). Pt has no history of injury to knee joints or lower extrimities and denies clicking and popping. Major impairments include muscle performance deficits in hip flexors, extensors, internal rotators, quadriceps, and hamstrings bilaterally with worse deficits on Rt, see flowsheet. Additional impairments include poor motor control throughout all manual muscle testing of bilateral hips and knees indicated by aberrant movement of the muscle contraction and compensatory movements of the trunk. Pt also presents with palpable muscle tightness in distal hamstring attachment on medial and lateral portions of the knee with reported tenderness in these areas. These impairments are limiting the patients ability to perform sit to stands without pain and walk > 15 minutes  without restrictions. Pt will benefit from skilled physical therapy to address these deficits stated above and optimize walking function to improve overall quality of life.    Personal Factors and Comorbidities Age;Comorbidity 2    Comorbidities COPD, overweight    Examination-Activity Limitations Sit;Stand;Squat    Examination-Participation Restrictions Other   Walking to lose weight   Stability/Clinical Decision Making Stable/Uncomplicated    Clinical Decision Making Low    Rehab Potential Good    PT Frequency 2x / week    PT Duration 6 weeks    PT Treatment/Interventions ADLs/Self Care Home Management;Gait training;Electrical Stimulation;Neuromuscular re-education;Balance training;Therapeutic exercise;Therapeutic activities;Functional mobility training;Patient/family education;Stair training;Passive range of motion;Dry needling;Taping;Vasopneumatic Device;Moist Heat;Cryotherapy    PT Next Visit Plan Assess functional activities (squat, bending, walking); Assess calf flexibility; Address  muscle strength and endurance;    PT Home Exercise Plan [email or web address]    Consulted and Agree with Plan of Care Patient             Patient will benefit from skilled therapeutic intervention in order to improve the following deficits and impairments:  Difficulty walking, Decreased endurance, Pain, Decreased activity tolerance, Decreased strength  Visit Diagnosis: Muscle weakness (generalized)  Chronic pain of right knee  Chronic pain of left knee     Problem List Patient Active Problem List   Diagnosis Date Noted   Weight gain 02/18/2020   Annual physical exam 01/16/2019   Subclavian artery stenosis (HCC) 11/16/2018   Aortic atherosclerosis (HCC) 11/04/2018   Osteoporosis 05/02/2017   Hyperlipidemia 01/03/2013   Seborrheic keratosis 02/22/2011   History of tobacco abuse 03/03/2006   COPD (chronic obstructive pulmonary disease) (HCC) 03/03/2006    Velna Ochs, SPT 11/05/20 3:40 PM   [facility] [address] [place], [place], [address] Phone: [phone]   Fax:  [phone]  Name: Kelly Snyder MRN: [id number] Date of Birth: 12/10/1945

## 2020-11-18 ENCOUNTER — Ambulatory Visit: Payer: Medicare Other | Admitting: Physical Therapy

## 2020-11-18 ENCOUNTER — Encounter: Payer: Self-pay | Admitting: Physical Therapy

## 2020-11-18 ENCOUNTER — Other Ambulatory Visit: Payer: Self-pay

## 2020-11-18 DIAGNOSIS — M6281 Muscle weakness (generalized): Secondary | ICD-10-CM | POA: Diagnosis not present

## 2020-11-18 DIAGNOSIS — G8929 Other chronic pain: Secondary | ICD-10-CM

## 2020-11-18 DIAGNOSIS — R262 Difficulty in walking, not elsewhere classified: Secondary | ICD-10-CM

## 2020-11-18 DIAGNOSIS — M25562 Pain in left knee: Secondary | ICD-10-CM

## 2020-11-18 NOTE — Therapy (Signed)
[facility] [address] [place], [place], [address] Phone: [phone]   Fax:  [phone]  Physical Therapy Treatment  Patient Details  Name: Kelly Snyder MRN: [id number] Date of Birth: [birth year] Referring Provider (PT): Moses Manners   Encounter Date: 11/18/2020   PT End of Session - 11/18/20 0859     Visit Number 2    Number of Visits 13    Date for PT Re-Evaluation 12/17/20    Authorization Type [employer] [facility]    PT Start Time 0850    PT Stop Time 0930    PT Time Calculation (min) 40 min    Activity Tolerance Patient tolerated treatment well    Behavior During Therapy [facility] for tasks assessed/performed             Past Medical History:  Diagnosis Date   Arthritis    COPD (chronic obstructive pulmonary disease) (HCC)    Pneumonia     Past Surgical History:  Procedure Laterality Date   DILATION AND CURETTAGE OF UTERUS  1970    There were no vitals filed for this visit.   Subjective Assessment - 11/18/20 0857     Subjective I alwasy have some pain in my knees.  Minimal . I get cramps in my legs.  They come and go.    Currently in Pain? Yes    Pain Score 2     Pain Location Knee    Pain Orientation Right;Left    Pain Descriptors / Indicators Tightness;Sore    Pain Type Chronic pain    Pain Onset More than a month ago    Pain Frequency Constant                [facility] PT Assessment - 11/18/20 0001       Functional Tests   Functional tests Squat      Squat   Comments genu valgus, hands on thighs      Transfers   Five time sit to stand comments  17.9 sec               [facility] Adult PT Treatment/Exercise:   Therapeutic Exercise: NuStep L 4 UE and LE for 5 min  Sit to stand x 10  Seated hamstring stretch 2 x 30 sec  Hamstring stretch supine  Knee to chest with knee ext and ankle DF/PF for post thigh x 5 each side  Bridge x 10 x 2 sets SLR 2 x 10 each side Sidelying clam x 15 each  side Gastroc stretch standing at the wall 30 sec x 3   Manual Therapy:   Neuromuscular re-ed: N/A   Therapeutic Activity: N/A   Modalities: N/A   Self Care: Rationale for stretching calf, HEP and strengthening    Consider / progression for next session:    standing as tolerated.                         PT Education - 11/18/20 0928     Education Details HEP, see note    Person(s) Educated Patient    Methods Explanation    Comprehension Verbalized understanding              PT Short Term Goals - 11/05/20 1132       PT SHORT TERM GOAL #1   Title Pt able to report independance with HEP to improve weight loss and improve strength.    Baseline None  Status New    Target Date 11/26/20      PT SHORT TERM GOAL #2   Title Pt able to demonstrate proper sit to stand mechanics without pain to improve transfers.    Baseline see flowsheet    Status New    Target Date 11/26/20      PT SHORT TERM GOAL #3   Title Physical therapist will assess 6 minute walk test to create goals for lower extrimity endurance, activitiy tolerance, and pain tolerance.    Baseline not assessed    Status New    Target Date 11/26/20               PT Long Term Goals - 11/05/20 1137       PT LONG TERM GOAL #1   Title Pt able to achieve predicted FOTO score of 62% to indicated clinically meaningful improvement of function.    Baseline 57%    Status New    Target Date 12/17/20      PT LONG TERM GOAL #2   Title Pt able to improve knee flexion and extension manual muscle tests to 4+/5 on Rt to improve knee stability and sit to stand function.    Baseline see flowsheet    Status New    Target Date 12/17/20      PT LONG TERM GOAL #3   Title Pt will be able to increase hip MMTs bilaterally to 4+/5 to improve knee stability and pain in standing and walking.    Baseline see flowsheet    Status New    Target Date 12/17/20                   Plan - 11/18/20  0859     Clinical Impression Statement Patient overall with low endurance but she was able to complete exercises with short rest breaks.  She was given an expanded HEP to address strength and ankle flexibility. No increased pain .    PT Treatment/Interventions ADLs/Self Care Home Management;Gait training;Electrical Stimulation;Neuromuscular re-education;Balance training;Therapeutic exercise;Therapeutic activities;Functional mobility training;Patient/family education;Stair training;Passive range of motion;Dry needling;Taping;Vasopneumatic Device;Moist Heat;Cryotherapy    PT Next Visit Plan cont with ther ex, endurance , standing as tolerated    PT Home Exercise Plan [email or web address]    Consulted and Agree with Plan of Care Patient             Patient will benefit from skilled therapeutic intervention in order to improve the following deficits and impairments:  Difficulty walking, Decreased endurance, Pain, Decreased activity tolerance, Decreased strength  Visit Diagnosis: Muscle weakness (generalized)  Chronic pain of right knee  Chronic pain of left knee  Difficulty in walking, not elsewhere classified     Problem List Patient Active Problem List   Diagnosis Date Noted   Weight gain 02/18/2020   Annual physical exam 01/16/2019   Subclavian artery stenosis (HCC) 11/16/2018   Aortic atherosclerosis (HCC) 11/04/2018   Osteoporosis 05/02/2017   Hyperlipidemia 01/03/2013   Seborrheic keratosis 02/22/2011   History of tobacco abuse 03/03/2006   COPD (chronic obstructive pulmonary disease) (HCC) 03/03/2006    Rohil Lesch, PT 11/18/2020, 9:35 AM  [facility] [address] [place], [place], [address] Phone: [phone]   Fax:  [phone]  Name: Kelly Snyder MRN: [id number] Date of Birth: 22-Apr-1945  Karie Mainland, PT 11/18/20 9:35 AM Phone: [phone] Fax: [phone]

## 2020-11-21 ENCOUNTER — Ambulatory Visit: Payer: Medicare Other

## 2020-11-21 ENCOUNTER — Other Ambulatory Visit: Payer: Self-pay

## 2020-11-21 DIAGNOSIS — R262 Difficulty in walking, not elsewhere classified: Secondary | ICD-10-CM

## 2020-11-21 DIAGNOSIS — M25562 Pain in left knee: Secondary | ICD-10-CM

## 2020-11-21 DIAGNOSIS — G8929 Other chronic pain: Secondary | ICD-10-CM

## 2020-11-21 DIAGNOSIS — M6281 Muscle weakness (generalized): Secondary | ICD-10-CM | POA: Diagnosis not present

## 2020-11-21 NOTE — Therapy (Signed)
[facility] [address] [place], [place], [address] Phone: [phone]   Fax:  [phone]  Physical Therapy Treatment  Patient Details  Name: Kelly Snyder MRN: [id number] Date of Birth: 06/20/1945 Referring Provider (PT): Moses Manners   Encounter Date: 11/21/2020   PT End of Session - 11/21/20 0930     Visit Number 3    Number of Visits 13    Date for PT Re-Evaluation 12/17/20    Authorization Type [employer] [facility]    PT Start Time 0930    PT Stop Time 1015    PT Time Calculation (min) 45 min    Activity Tolerance Patient tolerated treatment well    Behavior During Therapy [facility] for tasks assessed/performed             Past Medical History:  Diagnosis Date   Arthritis    COPD (chronic obstructive pulmonary disease) (HCC)    Pneumonia     Past Surgical History:  Procedure Laterality Date   DILATION AND CURETTAGE OF UTERUS  1970    There were no vitals filed for this visit.   Subjective Assessment - 11/21/20 0936     Subjective "I've been doing my exercises. I was sore after last time. I've got a little bit of pain now."    Currently in Pain? Yes    Pain Score 3     Pain Location Knee    Pain Orientation Right;Left    Pain Descriptors / Indicators Tightness;Sore    Pain Type Chronic pain    Pain Onset More than a month ago    Pain Frequency Intermittent    Aggravating Factors  walking    Pain Relieving Factors rest                [facility] Adult PT Treatment/Exercise:   Therapeutic Exercise: NuStep L 4 UE and LE for 5 min         96% SpO2 HR 127 post Prone quad stretch 60 sec each  Sit to stand 2 x 10; first set hold orange ball, 2nd set with 5 lb kettlebell Sidelying hip abduction 2 x 10 bilateral  Bridge with adduction isometric 2 x 10 Forward step up 4 inch 1 x 10 bilateral  Education on incorporating walking program as part of HEP with recommendation to begin with short bouts multiple times  daily; handout provided with further instruction.    Not performed today: SLR 2 x 10 each side Sidelying clam x 15 each side Gastroc stretch standing at the wall 30 sec x 3 Seated hamstring stretch 2 x 30 sec  Hamstring stretch supine  Knee to chest with knee ext and ankle DF/PF for post thigh x 5 each side    Manual Therapy:  N/A Neuromuscular re-ed: N/A   Therapeutic Activity: N/A   Modalities: N/A   Self Care:    Consider / progression for next session:    standing as tolerated.                                PT Short Term Goals - 11/05/20 1132       PT SHORT TERM GOAL #1   Title Pt able to report independance with HEP to improve weight loss and improve strength.    Baseline None    Status New    Target Date 11/26/20      PT SHORT TERM GOAL #  2   Title Pt able to demonstrate proper sit to stand mechanics without pain to improve transfers.    Baseline see flowsheet    Status New    Target Date 11/26/20      PT SHORT TERM GOAL #3   Title Physical therapist will assess 6 minute walk test to create goals for lower extrimity endurance, activitiy tolerance, and pain tolerance.    Baseline not assessed    Status New    Target Date 11/26/20               PT Long Term Goals - 11/05/20 1137       PT LONG TERM GOAL #1   Title Pt able to achieve predicted FOTO score of 62% to indicated clinically meaningful improvement of function.    Baseline 57%    Status New    Target Date 12/17/20      PT LONG TERM GOAL #2   Title Pt able to improve knee flexion and extension manual muscle tests to 4+/5 on Rt to improve knee stability and sit to stand function.    Baseline see flowsheet    Status New    Target Date 12/17/20      PT LONG TERM GOAL #3   Title Pt will be able to increase hip MMTs bilaterally to 4+/5 to improve knee stability and pain in standing and walking.    Baseline see flowsheet    Status New    Target Date 12/17/20                    Plan - 11/21/20 0943     Clinical Impression Statement Continued with hip and knee strengthening today, which she tolerated well without reports of increased pain. She continues to have low endurance and reported mild SOB following NuStep at beginning of session, though oxygen and heart rate WNL. She quickly fatigues with targeted hip abductor strengthening and reports that her legs felt tired at conclusion of session.    PT Treatment/Interventions ADLs/Self Care Home Management;Gait training;Electrical Stimulation;Neuromuscular re-education;Balance training;Therapeutic exercise;Therapeutic activities;Functional mobility training;Patient/family education;Stair training;Passive range of motion;Dry needling;Taping;Vasopneumatic Device;Moist Heat;Cryotherapy    PT Next Visit Plan assess 6 MWT as part of STG goal!! cont with ther ex, endurance , standing as tolerated    PT Home Exercise Plan [email or web address]    Consulted and Agree with Plan of Care Patient             Patient will benefit from skilled therapeutic intervention in order to improve the following deficits and impairments:  Difficulty walking, Decreased endurance, Pain, Decreased activity tolerance, Decreased strength  Visit Diagnosis: Muscle weakness (generalized)  Chronic pain of right knee  Chronic pain of left knee  Difficulty in walking, not elsewhere classified     Problem List Patient Active Problem List   Diagnosis Date Noted   Weight gain 02/18/2020   Annual physical exam 01/16/2019   Subclavian artery stenosis (HCC) 11/16/2018   Aortic atherosclerosis (HCC) 11/04/2018   Osteoporosis 05/02/2017   Hyperlipidemia 01/03/2013   Seborrheic keratosis 02/22/2011   History of tobacco abuse 03/03/2006   COPD (chronic obstructive pulmonary disease) (HCC) 03/03/2006   Letitia Libra, PT, DPT, ATC 11/21/20 10:23 AM   [facility] [address] [place], [place], [address] Phone: [phone]   Fax:  [phone]  Name: Kelly Snyder MRN: [id number] Date of Birth: [birth year]

## 2020-11-25 ENCOUNTER — Ambulatory Visit: Payer: Medicare Other | Admitting: Physical Therapy

## 2020-11-25 ENCOUNTER — Ambulatory Visit
Admission: RE | Admit: 2020-11-25 | Discharge: 2020-11-25 | Disposition: A | Discharge status: Home / Self Care | Payer: Medicare Other | Source: Ambulatory Visit | Attending: Family Medicine | Admitting: Family Medicine

## 2020-11-25 ENCOUNTER — Encounter: Payer: Self-pay | Admitting: Physical Therapy

## 2020-11-25 ENCOUNTER — Other Ambulatory Visit: Payer: Self-pay

## 2020-11-25 DIAGNOSIS — R262 Difficulty in walking, not elsewhere classified: Secondary | ICD-10-CM

## 2020-11-25 DIAGNOSIS — M6281 Muscle weakness (generalized): Secondary | ICD-10-CM

## 2020-11-25 DIAGNOSIS — G8929 Other chronic pain: Secondary | ICD-10-CM

## 2020-11-25 NOTE — Therapy (Signed)
[facility] [address] [place], [place], [address] Phone: [phone]   Fax:  [phone]  Physical Therapy Treatment  Patient Details  Name: Kelly Snyder MRN: [id number] Date of Birth: 1945-10-31 Referring Provider (PT): Moses Manners   Encounter Date: 11/25/2020   PT End of Session - 11/25/20 0950     Visit Number 4    Number of Visits 13    Date for PT Re-Evaluation 12/17/20    Authorization Type [employer] MC    PT Start Time 0933    PT Stop Time 1015    PT Time Calculation (min) 42 min             Past Medical History:  Diagnosis Date   Arthritis    COPD (chronic obstructive pulmonary disease) (HCC)    Pneumonia     Past Surgical History:  Procedure Laterality Date   DILATION AND CURETTAGE OF UTERUS  1970    There were no vitals filed for this visit.   Subjective Assessment - 11/25/20 0940     Subjective Knees hurt a little today, nothing major.  Still feels sore.  Had dexa scan    Currently in Pain? Yes    Pain Score 3     Pain Location Knee    Pain Orientation Right;Left;Anterior    Pain Descriptors / Indicators Sore;Aching    Pain Type Chronic pain    Pain Onset More than a month ago    Pain Frequency Intermittent                [facility] PT Assessment - 11/25/20 0001       6 Minute Walk- Baseline   6 Minute Walk- Baseline yes    HR (bpm) 120    02 Sat (%RA) 98 %    Modified Borg Scale for Dyspnea 3- Moderate shortness of breath or breathing difficulty    Perceived Rate of Exertion (Borg) 13- Somewhat hard      6 minute walk test results    Aerobic Endurance Distance Walked 996             [facility] Adult PT Treatment/Exercise:   Therapeutic Exercise: NuStep 5 min L5 UE and LE  Seated piriformis 30 sec x 3 Supine hip abd/ER (clam ) green band x 15 Bridging with green band x 10 x 2 sets  Hip flexion green band (march ) x 10  Sit to stand with green band x 10 for outer hip  activation, no UE needed  Manual Therapy:    Neuromuscular re-ed: N/A   Therapeutic Activity: N/A   Modalities: N/A   Self Care: N/A   Consider / progression for next session:          PT Short Term Goals - 11/25/20 1003       PT SHORT TERM GOAL #1   Title Pt able to report independance with HEP to improve weight loss and improve strength.    Status On-going      PT SHORT TERM GOAL #2   Title Pt able to demonstrate proper sit to stand mechanics without pain to improve transfers.    Status On-going      PT SHORT TERM GOAL #3   Title Physical therapist will assess 6 minute walk test to create goals for lower extrimity endurance, activitiy tolerance, and pain tolerance.    Baseline 996 feet    Status Achieved  PT Long Term Goals - 11/05/20 1137       PT LONG TERM GOAL #1   Title Pt able to achieve predicted FOTO score of 62% to indicated clinically meaningful improvement of function.    Baseline 57%    Status New    Target Date 12/17/20      PT LONG TERM GOAL #2   Title Pt able to improve knee flexion and extension manual muscle tests to 4+/5 on Rt to improve knee stability and sit to stand function.    Baseline see flowsheet    Status New    Target Date 12/17/20      PT LONG TERM GOAL #3   Title Pt will be able to increase hip MMTs bilaterally to 4+/5 to improve knee stability and pain in standing and walking.    Baseline see flowsheet    Status New    Target Date 12/17/20                   Plan - 11/25/20 1012     Clinical Impression Statement Patient is tolerating activity well, reports has some pain/soreness with bridging (low back) but mild overall.  She has been dokng her HEP daily. She walks 5 min to hte mailbox and back, tries to do it 2 x per day. Added in theraband loop for outer hip activation.  6 min walk completed without rest break.    PT Treatment/Interventions ADLs/Self Care Home Management;Gait  training;Electrical Stimulation;Neuromuscular re-education;Balance training;Therapeutic exercise;Therapeutic activities;Functional mobility training;Patient/family education;Stair training;Passive range of motion;Dry needling;Taping;Vasopneumatic Device;Moist Heat;Cryotherapy    PT Next Visit Plan check band exercises (STS and bridge) cont with ther ex, endurance , standing as tolerated    PT Home Exercise Plan [email or web address]    Consulted and Agree with Plan of Care Patient             Patient will benefit from skilled therapeutic intervention in order to improve the following deficits and impairments:  Difficulty walking, Decreased endurance, Pain, Decreased activity tolerance, Decreased strength  Visit Diagnosis: Muscle weakness (generalized)  Chronic pain of right knee  Chronic pain of left knee  Difficulty in walking, not elsewhere classified     Problem List Patient Active Problem List   Diagnosis Date Noted   Weight gain 02/18/2020   Annual physical exam 01/16/2019   Subclavian artery stenosis (HCC) 11/16/2018   Aortic atherosclerosis (HCC) 11/04/2018   Osteoporosis 05/02/2017   Hyperlipidemia 01/03/2013   Seborrheic keratosis 02/22/2011   History of tobacco abuse 03/03/2006   COPD (chronic obstructive pulmonary disease) (HCC) 03/03/2006    Kristyana Notte, PT 11/25/2020, 10:19 AM  [facility] [address] [place], [place], [address] Phone: [phone]   Fax:  [phone]  Name: KOURTNEY TERRIQUEZ MRN: [id number] Date of Birth: 1945/07/31  Karie Mainland, PT 11/25/20 10:19 AM Phone: [phone] Fax: [phone]

## 2020-12-02 ENCOUNTER — Ambulatory Visit: Payer: Medicare Other

## 2020-12-04 ENCOUNTER — Encounter: Payer: Self-pay | Admitting: Family Medicine

## 2020-12-04 ENCOUNTER — Ambulatory Visit: Payer: Medicare Other | Admitting: Physical Therapy

## 2020-12-09 ENCOUNTER — Other Ambulatory Visit: Payer: Self-pay

## 2020-12-09 ENCOUNTER — Ambulatory Visit: Payer: Medicare Other | Attending: Family Medicine

## 2020-12-09 DIAGNOSIS — M25562 Pain in left knee: Secondary | ICD-10-CM | POA: Insufficient documentation

## 2020-12-09 DIAGNOSIS — G8929 Other chronic pain: Secondary | ICD-10-CM | POA: Insufficient documentation

## 2020-12-09 DIAGNOSIS — M25561 Pain in right knee: Secondary | ICD-10-CM | POA: Diagnosis present

## 2020-12-09 DIAGNOSIS — R262 Difficulty in walking, not elsewhere classified: Secondary | ICD-10-CM | POA: Diagnosis present

## 2020-12-09 DIAGNOSIS — M6281 Muscle weakness (generalized): Secondary | ICD-10-CM | POA: Insufficient documentation

## 2020-12-09 NOTE — Therapy (Addendum)
[facility] [address] [place], [place], [address] Phone: [phone]   Fax:  [phone]  Physical Therapy Treatment  Patient Details  Name: LISET MCMONIGLE MRN: [id number] Date of Birth: 04-Apr-1945 Referring Provider (PT): Moses Manners   Encounter Date: 12/09/2020   PT End of Session - 12/09/20 1603     Visit Number 5    Number of Visits 13    Date for PT Re-Evaluation 12/17/20    Authorization Type [employer] MC    PT Start Time 1015    PT Stop Time 1100    PT Time Calculation (min) 45 min    Activity Tolerance Patient tolerated treatment well    Behavior During Therapy [facility] for tasks assessed/performed             Past Medical History:  Diagnosis Date   Arthritis    COPD (chronic obstructive pulmonary disease) (HCC)    Pneumonia     Past Surgical History:  Procedure Laterality Date   DILATION AND CURETTAGE OF UTERUS  1970    There were no vitals filed for this visit.   Subjective Assessment - 12/09/20 1019     Subjective Pt says her knees have been sore, but shes tried to continue doing her HEP even though shes been sick.    Currently in Pain? Yes    Pain Score 3     Pain Location Knee    Pain Orientation Right    Pain Descriptors / Indicators Sore    Pain Type Chronic pain    Pain Onset More than a month ago    Pain Frequency Intermittent    Aggravating Factors  walking    Pain Relieving Factors rest            [facility] Adult PT Treatment/Exercise:   Therapeutic Exercise: NuStep 4 min L6 UE and LE  Seated piriformis 30 sec x 3 Squat to pillow on mat table 2x15  Single knee to chest 3x30secs  Standing hip abduction and extension 2x10 each leg  LAQ 3 lbs 2x10 each leg  Seated hip flexion 1x8, 3lb ankle weight, 2x15 w/o ankle weights.  Updated HEP   Manual Therapy:  NA   Neuromuscular re-ed: N/A   Therapeutic Activity: N/A   Modalities: N/A   Self Care: N/A   Consider /  progression for next session:                              PT Short Term Goals - 11/25/20 1003       PT SHORT TERM GOAL #1   Title Pt able to report independance with HEP to improve weight loss and improve strength.    Status On-going      PT SHORT TERM GOAL #2   Title Pt able to demonstrate proper sit to stand mechanics without pain to improve transfers.    Status On-going      PT SHORT TERM GOAL #3   Title Physical therapist will assess 6 minute walk test to create goals for lower extrimity endurance, activitiy tolerance, and pain tolerance.    Baseline 996 feet    Status Achieved               PT Long Term Goals - 11/05/20 1137       PT LONG TERM GOAL #1   Title Pt able to achieve predicted FOTO score of 62% to indicated clinically  meaningful improvement of function.    Baseline 57%    Status New    Target Date 12/17/20      PT LONG TERM GOAL #2   Title Pt able to improve knee flexion and extension manual muscle tests to 4+/5 on Rt to improve knee stability and sit to stand function.    Baseline see flowsheet    Status New    Target Date 12/17/20      PT LONG TERM GOAL #3   Title Pt will be able to increase hip MMTs bilaterally to 4+/5 to improve knee stability and pain in standing and walking.    Baseline see flowsheet    Status New    Target Date 12/17/20                   Plan - 12/09/20 1604     Clinical Impression Statement Pt tolerated todays session well which focused on hip and knee strengthening and hip mobility. Pt required heavy verbal and tactile cues for CKC hip strengthening exercises. Pt reports no increase in pain at the end of session and reports she "feels like she worked her muscles".    PT Treatment/Interventions ADLs/Self Care Home Management;Gait training;Electrical Stimulation;Neuromuscular re-education;Balance training;Therapeutic exercise;Therapeutic activities;Functional mobility  training;Patient/family education;Stair training;Passive range of motion;Dry needling;Taping;Vasopneumatic Device;Moist Heat;Cryotherapy    PT Next Visit Plan check band exercises (STS and bridge) cont with ther ex, endurance , standing as tolerated    PT Home Exercise Plan [email or web address]    Consulted and Agree with Plan of Care Patient             Patient will benefit from skilled therapeutic intervention in order to improve the following deficits and impairments:  Difficulty walking, Decreased endurance, Pain, Decreased activity tolerance, Decreased strength  Visit Diagnosis: No diagnosis found.     Problem List Patient Active Problem List   Diagnosis Date Noted   Weight gain 02/18/2020   Annual physical exam 01/16/2019   Subclavian artery stenosis (HCC) 11/16/2018   Aortic atherosclerosis (HCC) 11/04/2018   Osteoporosis 05/02/2017   Hyperlipidemia 01/03/2013   Seborrheic keratosis 02/22/2011   History of tobacco abuse 03/03/2006   COPD (chronic obstructive pulmonary disease) (HCC) 03/03/2006    Velna Ochs, SPT 12/09/20 4:10 PM   [facility] [address] [place], [place], [address] Phone: [phone]   Fax:  [phone]  Name: ANNIKA SELKE MRN: [id number] Date of Birth: [birth year]

## 2020-12-11 ENCOUNTER — Other Ambulatory Visit: Payer: Self-pay

## 2020-12-11 ENCOUNTER — Ambulatory Visit: Payer: Medicare Other

## 2020-12-11 DIAGNOSIS — M6281 Muscle weakness (generalized): Secondary | ICD-10-CM | POA: Diagnosis not present

## 2020-12-11 DIAGNOSIS — G8929 Other chronic pain: Secondary | ICD-10-CM

## 2020-12-11 DIAGNOSIS — R262 Difficulty in walking, not elsewhere classified: Secondary | ICD-10-CM

## 2020-12-11 DIAGNOSIS — M25562 Pain in left knee: Secondary | ICD-10-CM

## 2020-12-11 NOTE — Therapy (Addendum)
[facility] [address] [place], [place], [address] Phone: [phone]   Fax:  [phone]  Physical Therapy Treatment  Patient Details  Name: Kelly Snyder MRN: [id number] Date of Birth: Jul 04, 1945 Referring Provider (PT): Moses Manners   Encounter Date: 12/11/2020   PT End of Session - 12/11/20 0930     Visit Number 6    Number of Visits 13    Date for PT Re-Evaluation 12/17/20    Authorization Type [employer] MC    PT Start Time 0930    PT Stop Time 1015    PT Time Calculation (min) 45 min    Activity Tolerance Patient tolerated treatment well    Behavior During Therapy [facility] for tasks assessed/performed             Past Medical History:  Diagnosis Date   Arthritis    COPD (chronic obstructive pulmonary disease) (HCC)    Pneumonia     Past Surgical History:  Procedure Laterality Date   DILATION AND CURETTAGE OF UTERUS  1970    There were no vitals filed for this visit.   Subjective Assessment - 12/11/20 0936     Subjective Pt says shes a little sore today but has noticed that shes become less aware of her pain and has been able to ignore it more.    Currently in Pain? Yes    Pain Score 3     Pain Location Knee    Pain Orientation Right    Pain Descriptors / Indicators Sore    Pain Type Chronic pain    Pain Onset More than a month ago    Pain Frequency Constant    Aggravating Factors  walking    Pain Relieving Factors rest             [facility] Adult PT Treatment/Exercise:  Therapeutic Exercise: - Bike 4 minutes L1  - piriformis stretch 2x30secs bilat  - supine single knee to chest 2x30secs bilat  - LAQ 5lbs 3x8  - Leg press 35 lbs 2x10  - side step red theraband 3x10 steps each direction - squat to mat table 2x10   Manual Therapy: - NA  Neuromuscular re-ed: - NA  Therapeutic Activity: - NA  Self-care/Home Management: - NA    [facility] PT Assessment - 12/11/20 0001        Observation/Other Assessments   Focus on Therapeutic Outcomes (FOTO)  49%                                      PT Short Term Goals - 11/25/20 1003       PT SHORT TERM GOAL #1   Title Pt able to report independance with HEP to improve weight loss and improve strength.    Status On-going      PT SHORT TERM GOAL #2   Title Pt able to demonstrate proper sit to stand mechanics without pain to improve transfers.    Status On-going      PT SHORT TERM GOAL #3   Title Physical therapist will assess 6 minute walk test to create goals for lower extrimity endurance, activitiy tolerance, and pain tolerance.    Baseline 996 feet    Status Achieved               PT Long Term Goals - 11/05/20 1137       PT LONG  TERM GOAL #1   Title Pt able to achieve predicted FOTO score of 62% to indicated clinically meaningful improvement of function.    Baseline 57%    Status New    Target Date 12/17/20      PT LONG TERM GOAL #2   Title Pt able to improve knee flexion and extension manual muscle tests to 4+/5 on Rt to improve knee stability and sit to stand function.    Baseline see flowsheet    Status New    Target Date 12/17/20      PT LONG TERM GOAL #3   Title Pt will be able to increase hip MMTs bilaterally to 4+/5 to improve knee stability and pain in standing and walking.    Baseline see flowsheet    Status New    Target Date 12/17/20                   Plan - 12/11/20 1310     Clinical Impression Statement Pt tolerated todays session well which focused on CKC hip and knee strengthening. Pt required heavy verbal and tactile cues for form and technique throughout due to poor motor control of hips and tendancy to collapse into bilat genu valgum. Pt able to correct form with verbal and tactile cues, but cannot maintain. Pt reports moderate fatigue, but no increased pain at the end of session. FOTO was caputured today with a decline from baseline, but this  is not consistent with her subjective and objective improvements to date.    Personal Factors and Comorbidities Age;Comorbidity 2    Comorbidities COPD, overweight    Examination-Activity Limitations Sit;Stand;Squat    Examination-Participation Restrictions Other    PT Treatment/Interventions ADLs/Self Care Home Management;Gait training;Electrical Stimulation;Neuromuscular re-education;Balance training;Therapeutic exercise;Therapeutic activities;Functional mobility training;Patient/family education;Stair training;Passive range of motion;Dry needling;Taping;Vasopneumatic Device;Moist Heat;Cryotherapy    PT Next Visit Plan assess response to therex; progress HEP    PT Home Exercise Plan [email or web address]    Consulted and Agree with Plan of Care Patient             Patient will benefit from skilled therapeutic intervention in order to improve the following deficits and impairments:  Difficulty walking, Decreased endurance, Pain, Decreased activity tolerance, Decreased strength  Visit Diagnosis: No diagnosis found.     Problem List Patient Active Problem List   Diagnosis Date Noted   Weight gain 02/18/2020   Annual physical exam 01/16/2019   Subclavian artery stenosis (HCC) 11/16/2018   Aortic atherosclerosis (HCC) 11/04/2018   Osteoporosis 05/02/2017   Hyperlipidemia 01/03/2013   Seborrheic keratosis 02/22/2011   History of tobacco abuse 03/03/2006   COPD (chronic obstructive pulmonary disease) (HCC) 03/03/2006   Velna Ochs, SPT 12/11/20 2:22 PM   [facility] [address] [place], [place], [address] Phone: [phone]   Fax:  [phone]  Name: KHILA PAPP MRN: [id number] Date of Birth: 12/06/1945

## 2020-12-15 ENCOUNTER — Ambulatory Visit: Payer: Medicare Other

## 2020-12-15 ENCOUNTER — Other Ambulatory Visit: Payer: Self-pay

## 2020-12-15 DIAGNOSIS — M25561 Pain in right knee: Secondary | ICD-10-CM

## 2020-12-15 DIAGNOSIS — R262 Difficulty in walking, not elsewhere classified: Secondary | ICD-10-CM

## 2020-12-15 DIAGNOSIS — M6281 Muscle weakness (generalized): Secondary | ICD-10-CM | POA: Diagnosis not present

## 2020-12-15 DIAGNOSIS — G8929 Other chronic pain: Secondary | ICD-10-CM

## 2020-12-15 DIAGNOSIS — M25562 Pain in left knee: Secondary | ICD-10-CM

## 2020-12-15 NOTE — Therapy (Addendum)
[facility] [address] [place], [place], [address] Phone: [phone]   Fax:  [phone]  Physical Therapy Treatment  Patient Details  Name: Kelly Snyder MRN: [id number] Date of Birth: 21-Jul-1945 Referring Provider (PT): Moses Manners   Encounter Date: 12/15/2020   PT End of Session - 12/15/20 1033     Visit Number 7    Number of Visits 13    Date for PT Re-Evaluation 12/17/20    Authorization Type [employer] MC    PT Start Time 0930    PT Stop Time 1015    PT Time Calculation (min) 45 min    Activity Tolerance Patient tolerated treatment well    Behavior During Therapy [facility] for tasks assessed/performed             Past Medical History:  Diagnosis Date   Arthritis    COPD (chronic obstructive pulmonary disease) (HCC)    Pneumonia     Past Surgical History:  Procedure Laterality Date   DILATION AND CURETTAGE OF UTERUS  1970    There were no vitals filed for this visit.   Subjective Assessment - 12/15/20 0938     Subjective Pt says that she is "used to the pain" but can tell its there. Pt says that she has the most pain and difficulty getting up off of the floor or playing with grandkids on the floor.    Currently in Pain? Yes    Pain Score 3     Pain Location Knee    Pain Orientation Right    Pain Descriptors / Indicators Sore    Pain Type Chronic pain    Pain Onset More than a month ago    Pain Frequency Constant    Aggravating Factors  bending over the get stuff on the floor    Pain Relieving Factors rest             [facility] Adult PT Treatment/Exercise:   Therapeutic Exercise: - Bike 6 minutes L1  - piriformis stretch 2x30secs bilat  - squat to mat table 2x10  - SLR supine 2x10 bilat  - foot taps to 8 inch step 1x20 bilat  - seated marching 1x20 bilat w/o UE support    Manual Therapy: - NA   Neuromuscular re-ed: - NA   Therapeutic Activity: - forward lunges  at counter to 2inch step  and airex, 1x5 bilat with UE support  - lunges on TRX attempted 1x6 bilat, discontinued due to UE support required.   Self-care/Home Management: - Discussed walking for health and wellness of joints and muscles.                              PT Short Term Goals - 11/25/20 1003       PT SHORT TERM GOAL #1   Title Pt able to report independance with HEP to improve weight loss and improve strength.    Status On-going      PT SHORT TERM GOAL #2   Title Pt able to demonstrate proper sit to stand mechanics without pain to improve transfers.    Status On-going      PT SHORT TERM GOAL #3   Title Physical therapist will assess 6 minute walk test to create goals for lower extrimity endurance, activitiy tolerance, and pain tolerance.    Baseline 996 feet    Status Achieved  PT Long Term Goals - 11/05/20 1137       PT LONG TERM GOAL #1   Title Pt able to achieve predicted FOTO score of 62% to indicated clinically meaningful improvement of function.    Baseline 57%    Status New    Target Date 12/17/20      PT LONG TERM GOAL #2   Title Pt able to improve knee flexion and extension manual muscle tests to 4+/5 on Rt to improve knee stability and sit to stand function.    Baseline see flowsheet    Status New    Target Date 12/17/20      PT LONG TERM GOAL #3   Title Pt will be able to increase hip MMTs bilaterally to 4+/5 to improve knee stability and pain in standing and walking.    Baseline see flowsheet    Status New    Target Date 12/17/20                   Plan - 12/15/20 1033     Clinical Impression Statement Pt tolerated todays session well which focused on hip and knee strengthening with functional tasks. Pt demonstrated difficulty with lunge due to decreased ability to maintain hip flexion to step over an object and eccentric control of quad into flexion. Pt reports significant fatigue following lunges with visually  increased respiration, but recovered after 4 minutes of standing rest. Pt required minimal tactile cues for form during sit to stands, indicating improvement in body awareness and motor control compared to previous treatment session. Pt reports moderate muscular fatigue in LE at the end of session and no increased pain.    Personal Factors and Comorbidities Age;Comorbidity 2    Comorbidities COPD, overweight    Examination-Activity Limitations Sit;Stand;Squat    Examination-Participation Restrictions Other    PT Treatment/Interventions ADLs/Self Care Home Management;Gait training;Electrical Stimulation;Neuromuscular re-education;Balance training;Therapeutic exercise;Therapeutic activities;Functional mobility training;Patient/family education;Stair training;Passive range of motion;Dry needling;Taping;Vasopneumatic Device;Moist Heat;Cryotherapy    PT Next Visit Plan assess response to therex; progress HEP; PNE for understanding pain management; give walking program handout    PT Home Exercise Plan [email or web address]    Consulted and Agree with Plan of Care Patient             Patient will benefit from skilled therapeutic intervention in order to improve the following deficits and impairments:  Difficulty walking, Decreased endurance, Pain, Decreased activity tolerance, Decreased strength  Visit Diagnosis: No diagnosis found.     Problem List Patient Active Problem List   Diagnosis Date Noted   Weight gain 02/18/2020   Annual physical exam 01/16/2019   Subclavian artery stenosis (HCC) 11/16/2018   Aortic atherosclerosis (HCC) 11/04/2018   Osteoporosis 05/02/2017   Hyperlipidemia 01/03/2013   Seborrheic keratosis 02/22/2011   History of tobacco abuse 03/03/2006   COPD (chronic obstructive pulmonary disease) (HCC) 03/03/2006   Velna Ochs, SPT 12/15/20 10:47 AM   [facility] [address] [place], [place], [address] Phone: [phone]    Fax:  [phone]  Name: Kelly Snyder MRN: [id number] Date of Birth: 1945-08-15

## 2020-12-17 ENCOUNTER — Other Ambulatory Visit: Payer: Self-pay

## 2020-12-17 ENCOUNTER — Ambulatory Visit: Payer: Medicare Other

## 2020-12-17 DIAGNOSIS — G8929 Other chronic pain: Secondary | ICD-10-CM

## 2020-12-17 DIAGNOSIS — M25562 Pain in left knee: Secondary | ICD-10-CM

## 2020-12-17 DIAGNOSIS — M6281 Muscle weakness (generalized): Secondary | ICD-10-CM | POA: Diagnosis not present

## 2020-12-17 DIAGNOSIS — R262 Difficulty in walking, not elsewhere classified: Secondary | ICD-10-CM

## 2020-12-17 NOTE — Therapy (Addendum)
[place], [place], [address] Phone: [phone]   Fax:  [phone]  Physical Therapy Treatment/Discharge  Patient Details  Name: Kelly Snyder MRN: [id number] Date of Birth: [birth year] Referring Provider (PT): Zenia Resides   Encounter Date: 12/17/2020   PT End of Session - 12/17/20 1010     Visit Number 8    Number of Visits 13    Date for PT Re-Evaluation --   NA discharge   Authorization Type [employer]    PT Start Time [phone]    PT Stop Time 1007    PT Time Calculation (min) 40 min    Activity Tolerance Patient tolerated treatment well    Behavior During Therapy [facility] for tasks assessed/performed             Past Medical History:  Diagnosis Date   Arthritis    COPD (chronic obstructive pulmonary disease) ([place])    Pneumonia     Past Surgical History:  Procedure Laterality Date   DILATION AND CURETTAGE OF UTERUS  1970    There were no vitals filed for this visit.   Subjective Assessment - 12/17/20 0943     Subjective Pt says that stooping down to pick things up on the floor is still difficult, but she has noticed overall decreased pain in her knees.    Currently in Pain? Yes    Pain Score 2     Pain Location Knee    Pain Orientation Right    Pain Descriptors / Indicators Sore    Pain Onset More than a month ago    Pain Frequency Constant    Aggravating Factors  squatting down to floor    Pain Relieving Factors rest            [facility] Adult PT Treatment/Exercise:  Therapeutic Exercise: - Updated HEP, instructed on walking program   Manual Therapy: - NA  Neuromuscular re-ed: - NA  Therapeutic Activity: - Discussed re-assessment findings, pathophysiology of current condition and presentation, progression, and achieved goals.   Self-care/Home Management: - Discussed walking for general health and wellness. - Explained health and wellness for older adults and benefits of  movement on joint integrity.       [facility] PT Assessment - 12/17/20 0001       Observation/Other Assessments   Focus on Therapeutic Outcomes (FOTO)  64%      Sit to Stand   Comments unremarkable, no increased pain, slow and controlled movement throughout      Strength   Right Hip Flexion 4+/5    Right Hip External Rotation  4+/5    Right Hip Internal Rotation 4+/5    Left Hip Flexion 4+/5    Left Hip External Rotation 4+/5    Left Hip Internal Rotation 4+/5    Right Knee Flexion 4+/5    Right Knee Extension 4+/5    Left Knee Flexion 4+/5    Left Knee Extension 4+/5      Transfers   Five time sit to stand comments  16secs                                      PT Short Term Goals - 12/17/20 0934       PT SHORT TERM GOAL #1   Title Pt able to report independance with HEP to improve weight loss and improve strength.  Status Achieved      PT SHORT TERM GOAL #2   Title Pt able to demonstrate proper sit to stand mechanics without pain to improve transfers.    Status Achieved      PT SHORT TERM GOAL #3   Title Physical therapist will assess 6 minute walk test to create goals for lower extrimity endurance, activitiy tolerance, and pain tolerance.    Baseline 996 feet    Status Achieved               PT Long Term Goals - 12/17/20 3267       PT LONG TERM GOAL #1   Title Pt able to achieve predicted FOTO score of 62% to indicated clinically meaningful improvement of function.    Baseline 57%    Status Achieved    Target Date 12/17/20      PT LONG TERM GOAL #2   Title Pt able to improve knee flexion and extension manual muscle tests to 4+/5 on Rt to improve knee stability and sit to stand function.    Baseline see flowsheet    Status Achieved    Target Date 12/17/20      PT LONG TERM GOAL #3   Title Pt will be able to increase hip MMTs bilaterally to 4+/5 to improve knee stability and pain in standing and walking.    Baseline see  flowsheet    Status Achieved    Target Date 12/17/20                   Plan - 12/17/20 1449     Clinical Impression Statement Patient presents to OPPT with minimal Rt knee pain, but overall improved function. Pt has achieved all short term and long term goals and demonstrates improved hip and knee strength, decreased aggrevating factors, and as well as overall decreased symtpoms. Pt is indep with HEP and self management of current condition. Therefore, patient is appropriate to be discharged at this time and encouraged to continue with HEP.    Personal Factors and Comorbidities Age;Comorbidity 2    Comorbidities COPD, overweight    PT Treatment/Interventions ADLs/Self Care Home Management;Gait training;Electrical Stimulation;Neuromuscular re-education;Balance training;Therapeutic exercise;Therapeutic activities;Functional mobility training;Patient/family education;Stair training;Passive range of motion;Dry needling;Taping;Vasopneumatic Device;Moist Heat;Cryotherapy    PT Next Visit Plan NA discharge    PT Miller and Agree with Plan of Care Patient             Patient will benefit from skilled therapeutic intervention in order to improve the following deficits and impairments:  Pain  Visit Diagnosis: No diagnosis found.     Problem List Patient Active Problem List   Diagnosis Date Noted   Weight gain 02/18/2020   Annual physical exam 01/16/2019   Subclavian artery stenosis ([place]) 11/16/2018   Aortic atherosclerosis ([place]) 11/04/2018   Osteoporosis 05/02/2017   Hyperlipidemia 01/03/2013   Seborrheic keratosis 02/22/2011   History of tobacco abuse 03/03/2006   COPD (chronic obstructive pulmonary disease) ([place]) 03/03/2006   PHYSICAL THERAPY DISCHARGE SUMMARY  Visits from Start of Care: 8  Current functional level related to goals / functional outcomes: See goals above   Remaining deficits: See impression   Education /  Equipment: HEP handout   Patient agrees to discharge. Patient goals were met. Patient is being discharged due to meeting the stated rehab goals.  Glade Lloyd, SPT 12/17/20 3:06 PM   [facility] [place],  [place], [address] Phone: [phone]   Fax:  [phone]  Name: Kelly Snyder MRN: [id number] Date of Birth: [birth year]

## 2020-12-18 ENCOUNTER — Ambulatory Visit (INDEPENDENT_AMBULATORY_CARE_PROVIDER_SITE_OTHER): Payer: Medicare Other | Admitting: Family Medicine

## 2020-12-18 ENCOUNTER — Other Ambulatory Visit: Payer: Self-pay

## 2020-12-18 DIAGNOSIS — L821 Other seborrheic keratosis: Secondary | ICD-10-CM | POA: Diagnosis not present

## 2020-12-18 DIAGNOSIS — L723 Sebaceous cyst: Secondary | ICD-10-CM | POA: Diagnosis not present

## 2020-12-18 NOTE — Assessment & Plan Note (Signed)
-  after discussion, patient politely declined cryotherapy -reassurance provided as to benign property of these lesions, no further intervention performed per patient preference

## 2020-12-18 NOTE — Assessment & Plan Note (Addendum)
-  less likely calcium deposit given movable quality, given history of hyperlipidemia, xanthoma on the differential but less likely given exam, appears benign  -incision and drainage performed without complications

## 2020-12-18 NOTE — Progress Notes (Signed)
° ° °  SUBJECTIVE:   CHIEF COMPLAINT / HPI:   Patient presents for lump above her left eye, states that she experienced something similar about 20 years ago but her previous doctor removed it by draining it. This particular lump started about 10 years ago and initially started on like a small "pimple" but then started to grow larger. But now has been stable and denies any growth over the past 5 years. Denies any pain, bleeding or drainage. When she puts a lot of pressure on it, it sometimes feel sore. Denies any relieving or aggravating factors, says that its "just there." Denies any new medications. Her main concern is that it irritates her when she wears her glasses and would like to have something done about it.    OBJECTIVE:   BP 126/80    Pulse 85    Ht 5\' 3"  (1.6 m)    Wt 157 lb 6.4 oz (71.4 kg)    SpO2 96%    BMI 27.88 kg/m   General: Patient well-appearing, in no acute distress. Resp: Normal work of breathing Derm: about 1 cm skin-colored nodular cyst noted medially above the left eye, no surrounding erythema or edema noted, soft and movable, brown circumscribed stuck on lesions noted on back and abdomen      Procedure  Incision and drainage performed of lesions. Appropriate materials gathered. Risks and benefits discussed with patient who consented to procedure. No localized anesthesia utilized per preference of patient. Area cleaned by alcohol swabs and betadine. 2 mm punch used to make an incision and less than 65mL pus drained. Area cleaned, ointment and covering applied. Patient tolerating procedure well without any complications.   ASSESSMENT/PLAN:   Sebaceous cyst -less likely calcium deposit given movable quality, given history of hyperlipidemia, xanthoma on the differential but less likely given exam, appears benign  -incision and drainage performed without complications  Seborrheic keratosis -after discussion, patient politely declined cryotherapy -reassurance provided as  to benign property of these lesions, no further intervention performed per patient preference      0m, DO [facility]

## 2020-12-18 NOTE — Patient Instructions (Signed)
It was great seeing you today!  It seems that the area you are concerned about above your eye is due to a sebaceous cyst. We drained this area. Please keep this area dry. If you notice any redness, swelling, ongoing bleeding or signs of an infection this please contact us and return back.   The other spots on your back are due to seborrheic keratosis which are benign.   Please follow up at your next scheduled appointment, if anything arises between now and then, please don't hesitate to contact our office.   Thank you for allowing [place] to be a part of your medical care!  Thank you, Dr. Robyne Peers

## 2020-12-21 ENCOUNTER — Other Ambulatory Visit: Payer: Self-pay | Admitting: Family Medicine

## 2020-12-21 DIAGNOSIS — J439 Emphysema, unspecified: Secondary | ICD-10-CM

## 2021-02-04 ENCOUNTER — Other Ambulatory Visit: Payer: Self-pay

## 2021-02-04 ENCOUNTER — Encounter: Payer: Self-pay | Admitting: Family Medicine

## 2021-02-04 ENCOUNTER — Ambulatory Visit (INDEPENDENT_AMBULATORY_CARE_PROVIDER_SITE_OTHER): Payer: Medicare Other | Admitting: Family Medicine

## 2021-02-04 VITALS — BP 161/62 | HR 86 | Wt 154.8 lb

## 2021-02-04 DIAGNOSIS — R600 Localized edema: Secondary | ICD-10-CM

## 2021-02-04 DIAGNOSIS — R519 Headache, unspecified: Secondary | ICD-10-CM

## 2021-02-04 DIAGNOSIS — R94120 Abnormal auditory function study: Secondary | ICD-10-CM | POA: Diagnosis not present

## 2021-02-04 DIAGNOSIS — H539 Unspecified visual disturbance: Secondary | ICD-10-CM | POA: Diagnosis not present

## 2021-02-04 DIAGNOSIS — R030 Elevated blood-pressure reading, without diagnosis of hypertension: Secondary | ICD-10-CM

## 2021-02-04 DIAGNOSIS — H9202 Otalgia, left ear: Secondary | ICD-10-CM | POA: Diagnosis not present

## 2021-02-04 DIAGNOSIS — E785 Hyperlipidemia, unspecified: Secondary | ICD-10-CM

## 2021-02-04 NOTE — Patient Instructions (Addendum)
Thank you for coming in today.  We discussed lower extremity swelling.  This is likely due to dependent edema as we discussed.  I would suggest using compression stockings during the day and elevating your feet at night.  I am repeating a BNP I will notify you when that is available.  We also discussed decreasing salt intake as well as processed foods.  I will notify you when your LDL results are available.  We also discussed acute headache that is throbbing in nature.  If you develop any vision changes, nausea, vomiting, chest pain go to the emergency department to be evaluated.  We also discussed left ear pain.  The inside of your ear looks normal.  If you continue to have issues please follow-up.  Due to your failed hearing screen I will refer you to the audiologist.   I would suggest getting an eye exam and an updated prescription for glasses.  Your blood pressure was elevated.  Return in 1 week for blood pressure recheck.  Dr. Salvadore Dom

## 2021-02-04 NOTE — Progress Notes (Addendum)
° ° °  SUBJECTIVE:   CHIEF COMPLAINT / HPI:   Ms. Kelly Snyder is a 76 yo F who presents for the below.   LE swelling Continues to have lower extremity swelling that is worse by the end of the day. Denies shortness of breath, chest pain, wheezing, and sputum production.   Headache Began this morning. Throbbing sensation in left ear and behind left eye. She puts sweet oil in her ears bilaterally. Denies vision changes, nausea, and vomiting.   HLD Not on a statin. Declines statin medication.   PERTINENT  PMH / PSH: COPD, OA. Fam Hx of CHF; no personal hx  OBJECTIVE:   BP (!) 161/62    Pulse 86    Wt 154 lb 12.8 oz (70.2 kg)    SpO2 96%    BMI 27.42 kg/m   Hearing Screening   500Hz  1000Hz  2000Hz  4000Hz   Right ear Fail Fail Fail Fail  Left ear Fail Fail Fail Fail   Vision Screening   Right eye Left eye Both eyes  Without correction 20/40 20/100 20/70  With correction      Physical Exam Vitals reviewed.  Constitutional:      General: She is not in acute distress.    Appearance: She is not ill-appearing, toxic-appearing or diaphoretic.  HENT:     Right Ear: Tympanic membrane, ear canal and external ear normal.     Left Ear: Tympanic membrane, ear canal and external ear normal.  Cardiovascular:     Rate and Rhythm: Normal rate and regular rhythm.     Heart sounds: Normal heart sounds.  Pulmonary:     Effort: Pulmonary effort is normal. No respiratory distress.     Breath sounds: Normal breath sounds. No wheezing, rhonchi or rales.  Musculoskeletal:     Right lower leg: No edema.     Left lower leg: No edema.  Neurological:     Mental Status: She is alert and oriented to person, place, and time. Mental status is at baseline.     Cranial Nerves: No cranial nerve deficit.     Gait: Gait normal.  Psychiatric:        Mood and Affect: Mood normal.        Behavior: Behavior normal.     ASSESSMENT/PLAN:   Lower extremity edema Chronic issue previously discussed.  Exam  unremarkable today without lower extremity swelling.  Encouraged use of compression stockings during the day and elevating feet at night.  BMP 1 year ago unremarkable.  Family history of CHF we will repeat BNP today. - Brain natriuretic peptide  Acute nonintractable headache, unspecified headache type Acute.  Mild.  No red flags.  Normal neurological exam.  Vision in hearing screen failed.  Also elevated BP today that could be the source.  Encouraged use of Tylenol as needed.  Return precautions given.  Left ear pain TMs unremarkable.  Failed hearing screen.  Refer to audiologist  Hyperlipidemia, unspecified hyperlipidemia type Declines statin medication.  Prior LDL 130s.  Discussed lifestyle changes.  Repeat direct LDL today. - LDL Cholesterol, Direct  Failed hearing screening - Ambulatory referral to Audiology  Elevated BP reading Follow up in 1 week for BP check. Discussed low sodium diet.   Failed vision screening Suggested ophthalmology follow up.   , DO [facility]

## 2021-02-05 LAB — BRAIN NATRIURETIC PEPTIDE: BNP: 11.9 pg/mL (ref 0.0–100.0)

## 2021-02-05 LAB — LDL CHOLESTEROL, DIRECT: LDL Direct: 136 mg/dL — ABNORMAL HIGH (ref 0–99)

## 2021-02-10 ENCOUNTER — Telehealth: Payer: Self-pay | Admitting: Family Medicine

## 2021-02-10 NOTE — Telephone Encounter (Signed)
Called patient with results of elevated LDL unchanged from last year 136.  Encouraged to start statin medication.  Discussed starting rosuvastatin 20 mg and patient does not remember this conversation.  Discussed benefits and side effects.  She will plan to look up statin medication.  Follow-up for discussion after visit 2/15.  Lavonda Jumbo, DO 02/10/2021, 1:27 PM PGY-3, [facility]

## 2021-02-12 ENCOUNTER — Other Ambulatory Visit: Payer: Self-pay

## 2021-02-12 ENCOUNTER — Ambulatory Visit: Payer: Medicare Other | Attending: Family Medicine | Admitting: Audiologist

## 2021-02-12 DIAGNOSIS — H903 Sensorineural hearing loss, bilateral: Secondary | ICD-10-CM | POA: Diagnosis not present

## 2021-02-12 NOTE — Procedures (Signed)
°  [facility] [address] [place], [place]  [address] [phone]  AUDIOLOGICAL  EVALUATION  NAME: Kelly Snyder     DOB:   Oct 02, 1945      MRN: [id number]                                                                                     DATE: 02/12/2021     REFERENT: Lavonda Jumbo, DO STATUS: Outpatient DIAGNOSIS: Sensorineural Hearing Loss Bilateral     History: Kelly Snyder was seen for an audiological evaluation.  Kelly Snyder is receiving a hearing evaluation due to concerns for her hearing after referring a screening with her primary care doctor. Kelly Snyder no difficulty hearing. No pain or pressure reported in either ear. Tinnitus denied for both ears. Kelly Snyder has no history of noise exposure.  Medical history negative for a condition which is a risk factor for hearing loss. No other relevant case history reported.    Evaluation:  Otoscopy showed a clear view of the tympanic membranes, bilaterally Tympanometry results were consistent with normal middle ear pressure, bilaterally   Audiometric testing was completed using conventional audiometry with insert and supraural transducer. Speech Recognition Thresholds were consistent with pure tone averages. Word Recognition was good at 40dB SL with masking in each ear. Pure tone thresholds show normal sloping after 2k Hz to moderate sensorineural hearing loss in both ears. Test results are consistent with slight conductive component at 4k Hz in each ear.   Results:  The test results were reviewed with Kelly Snyder. She has mostly normal hearing with a moderate high frequency hearing loss. She is not a hearing aid candidate at this time. She should continue to monitor her hearing every two years, or sooner if she perceives a change in hearing .   Recommendations: 1.   Recommend monitoring hearing with an evaluation every two years, or sooner if changes in hearing occur.    Ammie Ferrier  Audiologist, Au.D.,  CCC-A 02/12/2021  9:40 AM  Cc: Lavonda Jumbo, DO

## 2021-02-18 ENCOUNTER — Other Ambulatory Visit: Payer: Self-pay

## 2021-02-18 ENCOUNTER — Ambulatory Visit (INDEPENDENT_AMBULATORY_CARE_PROVIDER_SITE_OTHER): Payer: Medicare Other

## 2021-02-18 VITALS — BP 158/72 | HR 82

## 2021-02-18 DIAGNOSIS — Z0130 Encounter for examination of blood pressure without abnormal findings: Secondary | ICD-10-CM

## 2021-02-18 NOTE — Progress Notes (Signed)
Patient here today for BP check.      Last BP was on 02/04/2021 and was 161/62.  BP today is 158/72 with a pulse of 82.    Checked BP in left arm with regular adult cuff.    Patient denies any current symptoms, however, reports that at times she will wake up with headaches or develop a headache throughout the day.   Precepted with Dr. Leveda Anna. Advised that patient be scheduled for follow up appointment with PCP to discuss medication management for elevated BP readings.    Scheduled with PCP on 02/26/21.  Return precautions given.   Veronda Prude, RN

## 2021-02-26 ENCOUNTER — Other Ambulatory Visit: Payer: Self-pay

## 2021-02-26 ENCOUNTER — Ambulatory Visit (INDEPENDENT_AMBULATORY_CARE_PROVIDER_SITE_OTHER): Payer: Medicare Other | Admitting: Family Medicine

## 2021-02-26 VITALS — BP 158/64

## 2021-02-26 DIAGNOSIS — I10 Essential (primary) hypertension: Secondary | ICD-10-CM | POA: Diagnosis not present

## 2021-02-26 MED ORDER — HYDROCHLOROTHIAZIDE 12.5 MG PO TABS: 6.2500 mg | ORAL_TABLET | Freq: Every day | ORAL | 1 refills | Status: DC

## 2021-02-26 NOTE — Patient Instructions (Addendum)
It was wonderful to see you today.  Please bring ALL of your medications with you to every visit.   Today we talked about:  Your blood pressure remains elevated. We will start hydrochlorothiazide this can help with swelling as well. Follow up in 2 weeks.   Please be sure to schedule follow up at the front  desk before you leave today.   If you haven't already, sign up for My Chart to have easy access to your labs results, and communication with your primary care physician.  Please call the clinic at [phone] if your symptoms worsen or you have any concerns. It was our pleasure to serve you.  Dr. Salvadore Dom

## 2021-02-26 NOTE — Progress Notes (Signed)
° ° °  SUBJECTIVE:   CHIEF COMPLAINT / HPI:   Hypertension Here for BP check from prior elevated BP readings.  - Medications: N/a - Checking BP at home: No - Denies any SOB, CP, vision changes - hx of dependent edema - Diet: low salt  PERTINENT  PMH / PSH: COPD, HLD  OBJECTIVE:   BP (!) 158/64   Physical Exam Vitals reviewed.  Constitutional:      General: She is not in acute distress.    Appearance: She is not ill-appearing, toxic-appearing or diaphoretic.  Cardiovascular:     Pulses: Normal pulses.     Heart sounds: Normal heart sounds.  Pulmonary:     Effort: Pulmonary effort is normal.     Breath sounds: Normal breath sounds.  Musculoskeletal:     Right lower leg: No edema.     Left lower leg: No edema.  Neurological:     Mental Status: She is alert and oriented to person, place, and time.  Psychiatric:        Mood and Affect: Mood normal.        Behavior: Behavior normal.     ASSESSMENT/PLAN:   Hypertension, unspecified type Start half tablet of HCTZ. Discussed this has potential to help with LE dependent edema (a concerning issue for the patient). She does not like taking a lot of medication. Discussed starting low and going slow. Ideally will increase to full tablet at follow up. Plan to follow up in 1-2 weeks.  - Basic Metabolic Panel - hydrochlorothiazide (HYDRODIURIL) 12.5 MG tablet; Take 0.5 tablets (6.25 mg total) by mouth daily.  Dispense: 30 tablet; Refill: 1    Kelly Jumbo, DO [facility]

## 2021-02-27 LAB — BASIC METABOLIC PANEL
BUN/Creatinine Ratio: 11 — ABNORMAL LOW (ref 12–28)
BUN: 9 mg/dL (ref 8–27)
CO2: 26 mmol/L (ref 20–29)
Calcium: 10.2 mg/dL (ref 8.7–10.3)
Chloride: 102 mmol/L (ref 96–106)
Creatinine, Ser: 0.8 mg/dL (ref 0.57–1.00)
Glucose: 97 mg/dL (ref 70–99)
Potassium: 4.4 mmol/L (ref 3.5–5.2)
Sodium: 145 mmol/L — ABNORMAL HIGH (ref 134–144)
eGFR: 77 mL/min/{1.73_m2} (ref >59)

## 2021-03-18 ENCOUNTER — Other Ambulatory Visit: Payer: Self-pay

## 2021-03-18 ENCOUNTER — Ambulatory Visit (INDEPENDENT_AMBULATORY_CARE_PROVIDER_SITE_OTHER): Payer: Medicare Other | Admitting: Family Medicine

## 2021-03-18 VITALS — BP 134/80 | HR 87 | Ht 63.0 in | Wt 157.4 lb

## 2021-03-18 DIAGNOSIS — I10 Essential (primary) hypertension: Secondary | ICD-10-CM

## 2021-03-18 NOTE — Patient Instructions (Signed)
It was wonderful to see you today. ? ?Please bring ALL of your medications with you to every visit.  ? ?Today we talked about: ? ?Continuing blood pressure medication. ? ?Scheduling your medicare annual wellness visit  ? ?Please be sure to schedule follow up at the front  desk before you leave today.  ? ?If you haven't already, sign up for My Chart to have easy access to your labs results, and communication with your primary care physician. ? ?Please call the clinic at [phone] if your symptoms worsen or you have any concerns. It was our pleasure to serve you. ? ?Dr. Salvadore Dom ? ?

## 2021-03-18 NOTE — Progress Notes (Signed)
? ? ?  SUBJECTIVE:  ? ?CHIEF COMPLAINT / HPI:  ? ?Hypertension ?- Medications: HCTZ 6.25 mg daily ?- Compliance: Yes ?- Checking BP at home: No ?- Denies any SOB, CP, vision changes, dizziness ?- hx of dependent edema ?- Diet: low salt ? ?PERTINENT  PMH / PSH: COPD, former smoker  ? ?OBJECTIVE:  ? ?BP 134/80   Pulse 87   Ht 5\' 3"  (1.6 m)   Wt 157 lb 6.4 oz (71.4 kg)   SpO2 93%   BMI 27.88 kg/m?   ?Physical Exam ?Vitals reviewed.  ?Constitutional:   ?   General: She is not in acute distress. ?   Appearance: She is not ill-appearing, toxic-appearing or diaphoretic.  ?HENT:  ?   Right Ear: External ear normal.  ?   Left Ear: External ear normal.  ?Cardiovascular:  ?   Rate and Rhythm: Normal rate and regular rhythm.  ?   Heart sounds: Normal heart sounds.  ?Pulmonary:  ?   Effort: Pulmonary effort is normal.  ?   Breath sounds: Normal breath sounds.  ?Neurological:  ?   Mental Status: She is alert and oriented to person, place, and time.  ?Psychiatric:     ?   Mood and Affect: Mood normal.     ?   Behavior: Behavior normal.  ? ?ASSESSMENT/PLAN:  ? ?Hypertension, unspecified type ?Well controlled. Continue current medication. Continue to monitor at subsequent visits. Encouraged to schedule annual wellness visit. ?- Basic Metabolic Panel ? ?Joanna Hall Autry-Lott, DO ?[facility]  ?

## 2021-03-19 LAB — BASIC METABOLIC PANEL
BUN/Creatinine Ratio: 14 (ref 12–28)
BUN: 10 mg/dL (ref 8–27)
CO2: 29 mmol/L (ref 20–29)
Calcium: 8.9 mg/dL (ref 8.7–10.3)
Chloride: 94 mmol/L — ABNORMAL LOW (ref 96–106)
Creatinine, Ser: 0.7 mg/dL (ref 0.57–1.00)
Glucose: 110 mg/dL — ABNORMAL HIGH (ref 70–99)
Potassium: 3.9 mmol/L (ref 3.5–5.2)
Sodium: 135 mmol/L (ref 134–144)
eGFR: 91 mL/min/{1.73_m2} (ref >59)

## 2021-04-09 ENCOUNTER — Telehealth: Payer: Self-pay

## 2021-04-09 NOTE — Telephone Encounter (Signed)
Pharmacy calls nurse line reporting the patient came in requesting a refill on HCTZ. ? ?Pharmacy reports the she was given a 90 day supply on 2/23 taking 1/2 a tab daily. However, patient reported she has been taking a full tab daily.  ? ?For insurance purposes the prescription will need to be resent as taking a full tab a day for coverage.  ? ?Will advise patient to take half as instructed or remain on a full per PCP recommendations.  ? ?Will schedule a FU visit.  ?

## 2021-04-13 ENCOUNTER — Other Ambulatory Visit: Payer: Self-pay | Admitting: Family Medicine

## 2021-04-13 DIAGNOSIS — I10 Essential (primary) hypertension: Secondary | ICD-10-CM

## 2021-04-13 MED ORDER — HYDROCHLOROTHIAZIDE 12.5 MG PO TABS: 6.2500 mg | ORAL_TABLET | Freq: Every day | ORAL | 0 refills | Status: DC

## 2021-04-28 ENCOUNTER — Ambulatory Visit (INDEPENDENT_AMBULATORY_CARE_PROVIDER_SITE_OTHER): Payer: Medicare Other

## 2021-04-28 VITALS — Ht 63.0 in | Wt 156.0 lb

## 2021-04-28 DIAGNOSIS — Z0000 Encounter for general adult medical examination without abnormal findings: Secondary | ICD-10-CM | POA: Diagnosis not present

## 2021-04-28 NOTE — Progress Notes (Addendum)
Subjective:   Kelly Snyder is a 76 y.o. female who presents for Medicare Annual (Subsequent) preventive examination.  Patient consented to have virtual visit and was identified by name and date of birth. Method of visit: Telephone  Encounter participants: Patient: Kelly Snyder - located at Home Nurse/Provider: Steva Colder - located at [facility] Others (if applicable): NA  Review of Systems: Defer to PCP  Cardiac Risk Factors include: advanced age (>49men, >65 women);hypertension  Objective:   Vitals: Ht 5\' 3"  (1.6 m)   Wt 156 lb (70.8 kg)   BMI 27.63 kg/m   Body mass index is 27.63 kg/m.     04/28/2021    1:18 PM 02/04/2021    9:33 AM 11/05/2020    9:45 AM 02/18/2020   10:03 AM 12/28/2018   10:33 AM 11/02/2018   11:48 PM 09/22/2015    9:32 AM  Advanced Directives  Does Patient Have a Medical Advance Directive? No No No No No No No  Would patient like information on creating a medical advance directive? Yes (MAU/Ambulatory/Procedural Areas - Information given) No - Patient declined No - Patient declined No - Patient declined No - Patient declined No - Patient declined Yes - Educational materials given   Tobacco Social History   Tobacco Use  Smoking Status Former   Packs/day: 0.50   Years: 49.00   Pack years: 24.50   Types: Cigarettes   Start date: 01/04/1966   Passive exposure: Past  Smokeless Tobacco Never  Tobacco Comments   quit 10/28/18     Counseling given: No plans to restart   Clinical Intake:  Pre-visit preparation completed: Yes  Pain Score: 0-No pain  Nutritional Status: BMI 25 -29 Overweight Diabetes: No  How often do you need to have someone help you when you read instructions, pamphlets, or other written materials from your doctor or pharmacy?: 2 - Rarely What is the last grade level you completed in school?: High School  Interpreter Needed?: No  Past Medical History:  Diagnosis Date   Arthritis    COPD (chronic obstructive pulmonary  disease) (HCC)    Pneumonia    Past Surgical History:  Procedure Laterality Date   DILATION AND CURETTAGE OF UTERUS  1970   Family History  Problem Relation Age of Onset   Diabetes Mother    Heart disease Mother    Heart disease Father    Parkinson's disease Sister    Dementia Sister    Alcohol abuse Son    Social History   Socioeconomic History   Marital status: Divorced    Spouse name: Not on file   Number of children: 1   Years of education: 12   Highest education level: 12th grade  Occupational History   Occupation: retired  Tobacco Use   Smoking status: Former    Packs/day: 0.50    Years: 49.00    Pack years: 24.50    Types: Cigarettes    Start date: 01/04/1966    Passive exposure: Past   Smokeless tobacco: Never   Tobacco comments:    quit 10/28/18  Vaping Use   Vaping Use: Never used  Substance and Sexual Activity   Alcohol use: No   Drug use: No   Sexual activity: Not Currently  Other Topics Concern   Not on file  Social History Narrative   Health Care POA: Emergency Contact: Kelly Snyder (son) [phone]       Patient lives with her son and grandson. Patient has dogs and  cats.    Patient enjoys gardening and walking for exercise.    Patients sister has Parkinson's Disease. Patient enjoys spending time with her.          Social Determinants of Health   Financial Resource Strain: Low Risk    Difficulty of Paying Living Expenses: Not very hard  Food Insecurity: No Food Insecurity   Worried About Programme researcher, broadcasting/film/video in the Last Year: Never true   Ran Out of Food in the Last Year: Never true  Transportation Needs: No Transportation Needs   Lack of Transportation (Medical): No   Lack of Transportation (Non-Medical): No  Physical Activity: Sufficiently Active   Days of Exercise per Week: 7 days   Minutes of Exercise per Session: 30 min  Stress: No Stress Concern Present   Feeling of Stress : Not at all  Social Connections: Moderately Isolated   Frequency  of Communication with Friends and Family: More than three times a week   Frequency of Social Gatherings with Friends and Family: More than three times a week   Attends Religious Services: More than 4 times per year   Active Member of [employer] or Organizations: No   Attends Banker Meetings: Never   Marital Status: Divorced   Outpatient Encounter Medications as of 04/28/2021  Medication Sig   acetaminophen (TYLENOL) 500 MG tablet Take 1,000 mg by mouth every 6 (six) hours as needed for moderate pain (pain).    albuterol (VENTOLIN HFA) 108 (90 Base) MCG/ACT inhaler Inhale 2 puffs into the lungs every 6 (six) hours as needed for wheezing or shortness of breath.   Calcium Carb-Cholecalciferol (CALCIUM 600-D PO) Take 1 tablet by mouth daily.   hydrochlorothiazide (HYDRODIURIL) 12.5 MG tablet Take 0.5 tablets (6.25 mg total) by mouth daily.   Multiple Vitamin (MULTIVITAMIN WITH MINERALS) TABS tablet Take 1 tablet by mouth daily.   SPIRIVA RESPIMAT 2.5 MCG/ACT AERS INHALE 2 PUFFS BY MOUTH INTO THE LUNGS DAILY   alendronate (FOSAMAX) 10 MG tablet Take 1 tablet (10 mg total) by mouth daily before breakfast. Take with a full glass of water on an empty stomach. (Patient not taking: Reported on 04/28/2021)   No facility-administered encounter medications on file as of 04/28/2021.   Activities of Daily Living    04/28/2021    1:22 PM  In your present state of health, do you have any difficulty performing the following activities:  Hearing? 1  Vision? 0  Difficulty concentrating or making decisions? 0  Walking or climbing stairs? 0  Dressing or bathing? 0  Doing errands, shopping? 0  Preparing Food and eating ? N  Using the Toilet? N  In the past six months, have you accidently leaked urine? N  Do you have problems with loss of bowel control? N  Managing your Medications? N  Managing your Finances? N  Housekeeping or managing your Housekeeping? N   Patient Care Team: Lavonda Jumbo, DO as PCP - General    Assessment:   This is a routine wellness examination for Kelly Snyder.  Exercise Activities and Dietary recommendations Current Exercise Habits: Home exercise routine, Type of exercise: walking, Time (Minutes): 30, Frequency (Times/Week): 7, Weekly Exercise (Minutes/Week): 210, Intensity: Mild, Exercise limited by: respiratory conditions(s);orthopedic condition(s)   Goals      LDL CALC < 100     Weight (lb) < 140 lb (63.5 kg)     Patient reports she gained weight after she quit smoking.  Patient reports she wants to focus  on a healthy diet.        Fall Risk    04/28/2021    1:19 PM 02/04/2021    9:30 AM 10/17/2020    3:34 PM 02/18/2020   10:01 AM 01/15/2019    3:45 PM  Fall Risk   Falls in the past year? 0 0 0 0 0  Number falls in past yr: 0 0  0 0  Injury with Fall? 0 0  0   Risk for fall due to : Orthopedic patient      Follow up Falls prevention discussed    Falls evaluation completed   Patient reports normal gait and balance. Patient does not use assistive devices to ambulate.   Is the patient's home free of loose throw rugs in walkways, pet beds, electrical cords, etc?   yes      Grab bars in the bathroom? yes      Handrails on the stairs?   yes      Adequate lighting?   yes  Patient rating of health (0-10) scale: 9  Depression Screen    04/28/2021    1:18 PM 04/28/2021    1:10 PM 02/04/2021    9:30 AM 10/17/2020    3:35 PM  PHQ 2/9 Scores  PHQ - 2 Score 0 0 0 0  PHQ- 9 Score 1  1 0    Cognitive Function    06/19/2013    3:00 PM 06/19/2013   11:00 AM  MMSE - Mini Mental State Exam  Orientation to time 4 5  Orientation to Place 5 4  Registration 3 3  Attention/ Calculation 5 5  Recall 2 2  Language- name 2 objects 2 2  Language- repeat 1 1  Language- follow 3 step command 3 3  Language- read & follow direction 1 1  Write a sentence 1 1  Copy design 1 1  Total score 28 28      04/28/2021    1:24 PM  6CIT Screen  What Year? 0  points  What month? 0 points  What time? 0 points  Count back from 20 0 points  Months in reverse 0 points  Repeat phrase 0 points  Total Score 0 points   Immunization History  Administered Date(s) Administered   Tdap 10/21/2015   Qualifies for Shingles Vaccine? Yes   Shingrix Completed: No, Education has been provided regarding the importance of this vaccine. Advised may receive this vaccine at local pharmacy or Health Dept. Aware to provide a copy of the vaccination record if obtained from local pharmacy or Health Dept. Verbalized acceptance and understanding.  Qualifies for Pneumonia Vaccine? Yes   PNA Completed: No, Education has been provided regarding the importance of this vaccine. Advised may receive this vaccine at local pharmacy or Health Dept. Aware to provide a copy of the vaccination record if obtained from local pharmacy or Health Dept. Verbalized acceptance and understanding.  Qualifies for Covid Vaccine? Yes   Bivalent Completed: No, Education has been provided regarding the importance of this vaccine. Advised may receive this vaccine at local pharmacy or Health Dept. Aware to provide a copy of the vaccination record if obtained from local pharmacy or Health Dept. Verbalized acceptance and understanding.  Screening Tests Health Maintenance  Topic Date Due   COVID-19 Vaccine (1) Never done   Zoster Vaccines- Shingrix (1 of 2) 06/18/2021 (Originally 05/12/1964)   Pneumonia Vaccine 23+ Years old (1 - PCV) 03/19/2022 (Originally 05/12/1951)   INFLUENZA VACCINE  08/04/2021  MAMMOGRAM  08/06/2022   Fecal DNA (Cologuard)  02/25/2023   TETANUS/TDAP  10/20/2025   DEXA SCAN  Completed   Hepatitis C Screening  Completed   HPV VACCINES  Aged Out   Cancer Screenings: Lung: Low Dose CT Chest recommended if Age 91-80 years, 20 pack-year currently smoking OR have quit w/in 15years. Patient does qualify. Last screening 03/03/2020 Breast:  Up to date on Mammogram? Yes   Up to date of  Bone Density/Dexa? Yes Colorectal: Completed- Cologuard 02/25/2020  Additional Screenings: Hepatitis C Screening: Completed   Plan:  FU with PCP as needed for blood pressure. You are due for covid, pneumonia and shingles vaccines.  You can get these at your local pharmacy.  Keep up the great work walking!   I have personally reviewed and noted the following in the patient's chart:   Medical and social history Use of alcohol, tobacco or illicit drugs  Current medications and supplements Functional ability and status Nutritional status Physical activity Advanced directives List of other physicians Hospitalizations, surgeries, and ER visits in previous 12 months Vitals Screenings to include cognitive, depression, and falls Referrals and appointments  In addition, I have reviewed and discussed with patient certain preventive protocols, quality metrics, and best practice recommendations. A written personalized care plan for preventive services as well as general preventive health recommendations were provided to patient.  This visit was conducted virtually in the setting of the COVID19 pandemic.    Steva Colder, CMA  04/30/2021   I have reviewed this visit and agree with the documentation.  Terisa Starr, MD  Family Medicine Teaching Service

## 2021-04-30 NOTE — Patient Instructions (Signed)
Kelly Snyder ?Thank you for taking time to come for your Medicare Wellness Visit. I appreciate your ongoing commitment to your health goals. Please review the following plan we discussed and let me know if I can assist you in the future.  ?  ?These are the goals we discussed: ? ? Goals   ? ?  LDL CALC < 100   ?  Weight (lb) < 140 lb (63.5 kg)   ?  Patient reports she gained weight after she quit smoking.  ?Patient reports she wants to focus on a healthy diet.  ?  ? ?  ? ?We also discussed recommended health maintenance. As discussed, you are due for: ?Health Maintenance  ?Topic Date Due  ? COVID-19 Vaccine (1) Never done  ? Zoster Vaccines- Shingrix (1 of 2) 06/18/2021 (Originally 05/12/1964)  ? Pneumonia Vaccine 31+ Years old (1 - PCV) 03/19/2022 (Originally 05/12/1951)  ? INFLUENZA VACCINE  08/04/2021  ? MAMMOGRAM  08/06/2022  ? Fecal DNA (Cologuard)  02/25/2023  ? TETANUS/TDAP  10/20/2025  ? DEXA SCAN  Completed  ? Hepatitis C Screening  Completed  ? HPV VACCINES  Aged Out  ? ?FU with PCP as needed for blood pressure. ?You are due for covid, pneumonia and shingles vaccines.  ?You can get these at your local pharmacy.  ?Keep up the great work walking!  ? ?Preventive Care 63 Years and Older, Female ?Preventive care refers to lifestyle choices and visits with your health care provider that can promote health and wellness. Preventive care visits are also called wellness exams. ?What can I expect for my preventive care visit? ?Counseling ?Your health care provider may ask you questions about your: ?Medical history, including: ?Past medical problems. ?Family medical history. ?Pregnancy and menstrual history. ?History of falls. ?Current health, including: ?Memory and ability to understand (cognition). ?Emotional well-being. ?Home life and relationship well-being. ?Sexual activity and sexual health. ?Lifestyle, including: ?Alcohol, nicotine or tobacco, and drug use. ?Access to firearms. ?Diet, exercise, and sleep habits. ?Work and  work Statistician. ?Sunscreen use. ?Safety issues such as seatbelt and bike helmet use. ?Physical exam ?Your health care provider will check your: ?Height and weight. These may be used to calculate your BMI (body mass index). BMI is a measurement that tells if you are at a healthy weight. ?Waist circumference. This measures the distance around your waistline. This measurement also tells if you are at a healthy weight and may help predict your risk of certain diseases, such as type 2 diabetes and high blood pressure. ?Heart rate and blood pressure. ?Body temperature. ?Skin for abnormal spots. ?What immunizations do I need? ? ?Vaccines are usually given at various ages, according to a schedule. Your health care provider will recommend vaccines for you based on your age, medical history, and lifestyle or other factors, such as travel or where you work. ?What tests do I need? ?Screening ?Your health care provider may recommend screening tests for certain conditions. This may include: ?Lipid and cholesterol levels. ?Hepatitis C test. ?Hepatitis B test. ?HIV (human immunodeficiency virus) test. ?STI (sexually transmitted infection) testing, if you are at risk. ?Lung cancer screening. ?Colorectal cancer screening. ?Diabetes screening. This is done by checking your blood sugar (glucose) after you have not eaten for a while (fasting). ?Mammogram. Talk with your health care provider about how often you should have regular mammograms. ?BRCA-related cancer screening. This may be done if you have a family history of breast, ovarian, tubal, or peritoneal cancers. ?Bone density scan. This is done to screen  for osteoporosis. ?Talk with your health care provider about your test results, treatment options, and if necessary, the need for more tests. ?Follow these instructions at home: ?Eating and drinking ? ?Eat a diet that includes fresh fruits and vegetables, whole grains, lean protein, and low-fat dairy products. Limit your intake  of foods with high amounts of sugar, saturated fats, and salt. ?Take vitamin and mineral supplements as recommended by your health care provider. ?Do not drink alcohol if your health care provider tells you not to drink. ?If you drink alcohol: ?Limit how much you have to 0-1 drink a day. ?Know how much alcohol is in your drink. In the [place] one drink equals one 12 oz bottle of beer (355 mL), one 5 oz glass of wine (148 mL), or one 1? oz glass of hard liquor (44 mL). ?Lifestyle ?Brush your teeth every morning and night with fluoride toothpaste. Floss one time each day. ?Exercise for at least 30 minutes 5 or more days each week. ?Do not use any products that contain nicotine or tobacco. These products include cigarettes, chewing tobacco, and vaping devices, such as e-cigarettes. If you need help quitting, ask your health care provider. ?Do not use drugs. ?If you are sexually active, practice safe sex. Use a condom or other form of protection in order to prevent STIs. ?Take aspirin only as told by your health care provider. Make sure that you understand how much to take and what form to take. Work with your health care provider to find out whether it is safe and beneficial for you to take aspirin daily. ?Ask your health care provider if you need to take a cholesterol-lowering medicine (statin). ?Find healthy ways to manage stress, such as: ?Meditation, yoga, or listening to music. ?Journaling. ?Talking to a trusted person. ?Spending time with friends and family. ?Minimize exposure to UV radiation to reduce your risk of skin cancer. ?Safety ?Always wear your seat belt while driving or riding in a vehicle. ?Do not drive: ?If you have been drinking alcohol. Do not ride with someone who has been drinking. ?When you are tired or distracted. ?While texting. ?If you have been using any mind-altering substances or drugs. ?Wear a helmet and other protective equipment during sports activities. ?If you have firearms in your  house, make sure you follow all gun safety procedures. ?What's next? ?Visit your health care provider once a year for an annual wellness visit. ?Ask your health care provider how often you should have your eyes and teeth checked. ?Stay up to date on all vaccines. ?This information is not intended to replace advice given to you by your health care provider. Make sure you discuss any questions you have with your health care provider. ?Document Revised: 06/18/2020 Document Reviewed: 06/18/2020 ?Elsevier Patient Education ? [place]. ? ?Our clinic's number is [phone]. Please call with questions or concerns about what we discussed today.  ?

## 2021-05-06 ENCOUNTER — Other Ambulatory Visit: Payer: Self-pay | Admitting: Family Medicine

## 2021-05-06 DIAGNOSIS — J439 Emphysema, unspecified: Secondary | ICD-10-CM

## 2021-06-09 ENCOUNTER — Encounter: Payer: Self-pay | Admitting: *Deleted

## 2021-07-09 ENCOUNTER — Other Ambulatory Visit: Payer: Self-pay | Admitting: Family Medicine

## 2021-07-09 DIAGNOSIS — I10 Essential (primary) hypertension: Secondary | ICD-10-CM

## 2021-08-08 IMAGING — DX DG FOOT COMPLETE 3+V*L*
3 series · 3 of 3 positions shown · non-contrast
Comparison: None.

CLINICAL DATA: Left foot pain for 1 week. No trauma history
submitted.

EXAM:
LEFT FOOT - COMPLETE 3+ VIEW

[dg foot complete left (1 of 3)]
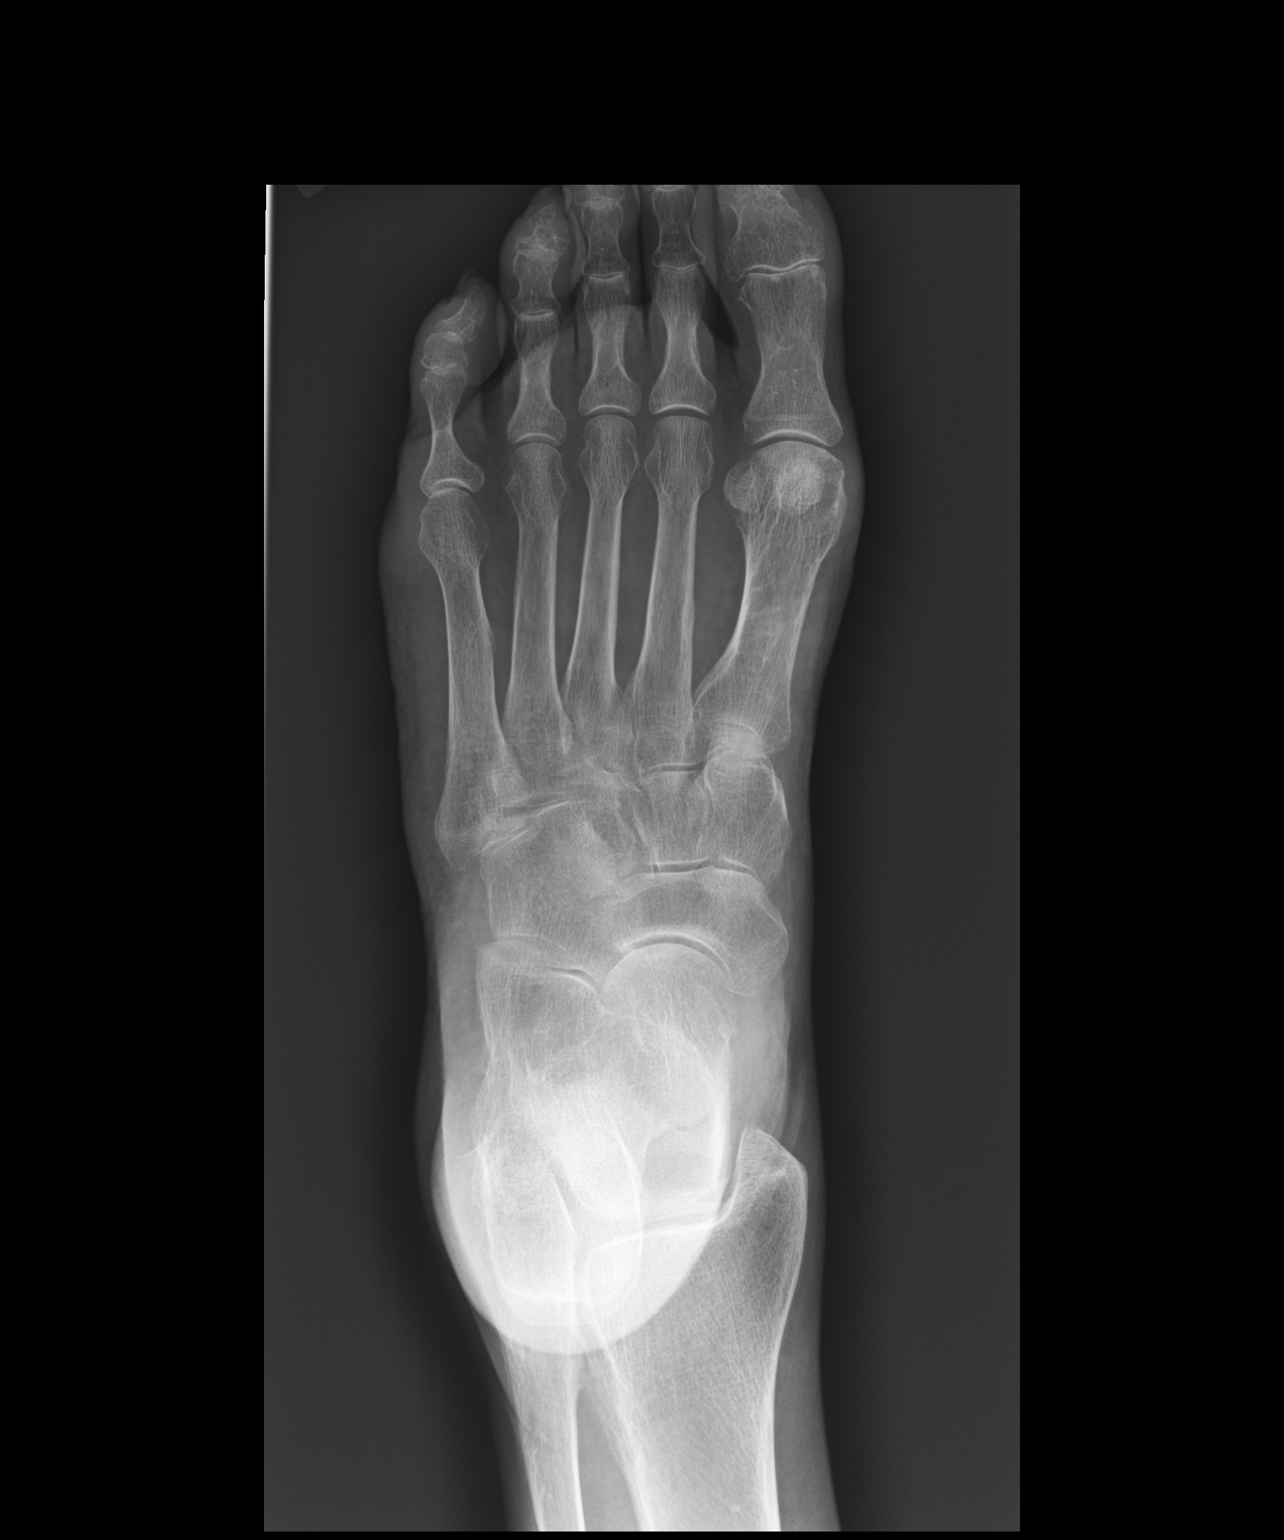

[dg foot complete left (2 of 3)]
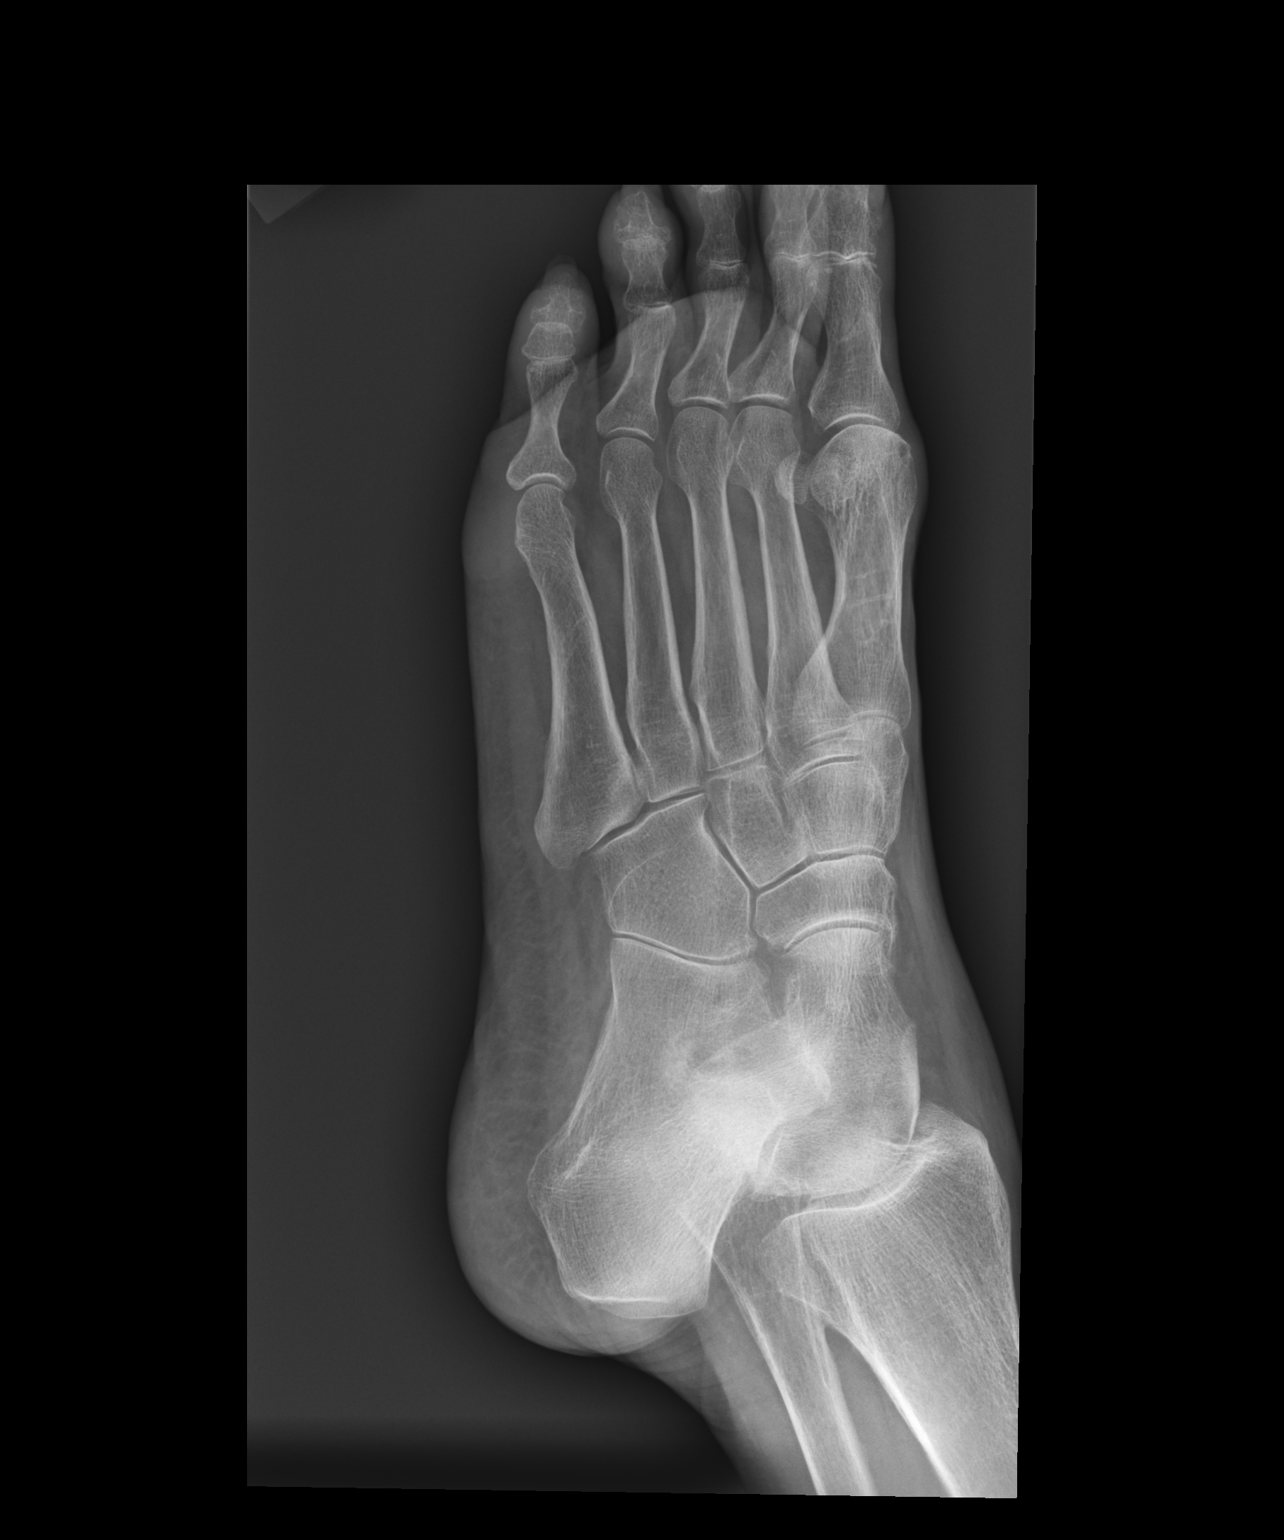

[dg foot complete left (3 of 3)]
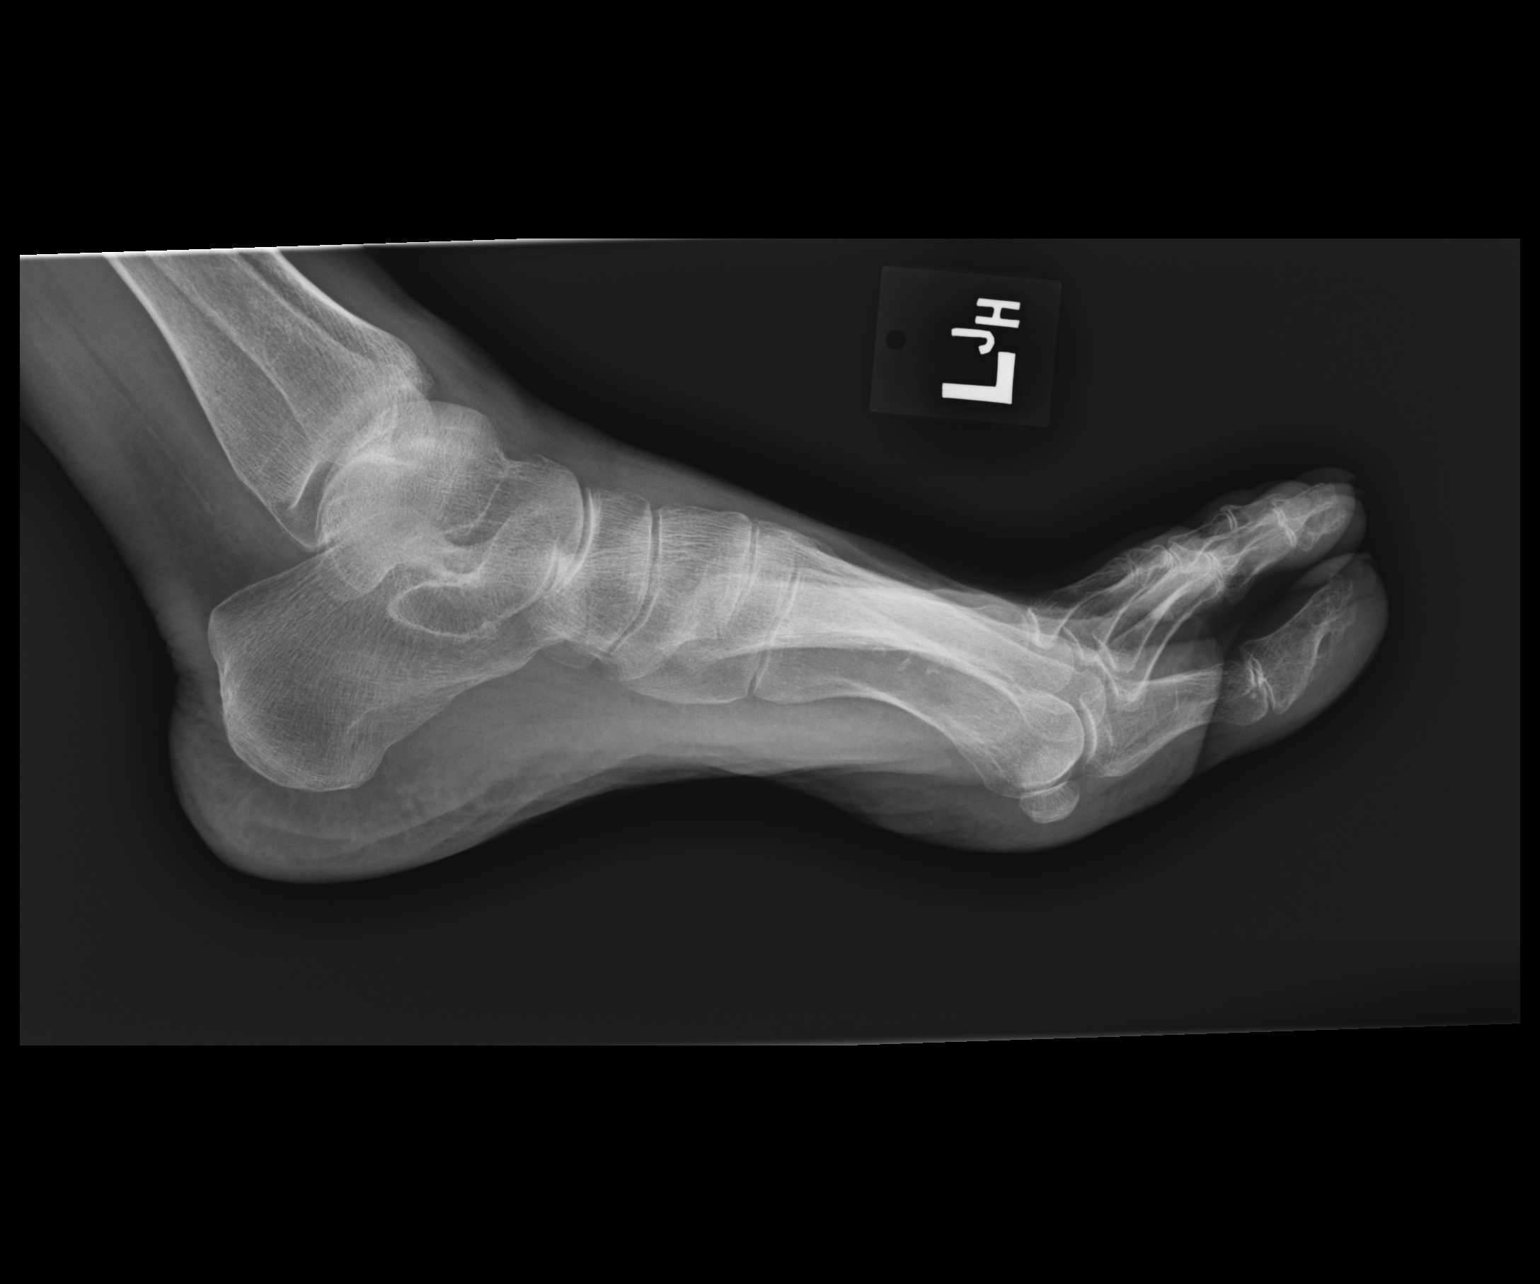

[3 of 3 positions shown; findings below may reference images not displayed]

FINDINGS: No acute fracture or dislocation. No periosteal reaction or callus
deposition. No radiopaque foreign object. No soft tissue gas.
IMPRESSION: No acute osseous abnormality.

## 2021-09-09 ENCOUNTER — Other Ambulatory Visit: Payer: Self-pay | Admitting: Family Medicine

## 2021-09-09 DIAGNOSIS — J439 Emphysema, unspecified: Secondary | ICD-10-CM

## 2021-09-24 ENCOUNTER — Other Ambulatory Visit: Payer: Self-pay | Admitting: Family Medicine

## 2021-09-24 DIAGNOSIS — Z1231 Encounter for screening mammogram for malignant neoplasm of breast: Secondary | ICD-10-CM

## 2021-10-06 ENCOUNTER — Other Ambulatory Visit: Payer: Self-pay | Admitting: Student

## 2021-10-06 DIAGNOSIS — I10 Essential (primary) hypertension: Secondary | ICD-10-CM

## 2021-10-27 ENCOUNTER — Ambulatory Visit
Admission: RE | Admit: 2021-10-27 | Discharge: 2021-10-27 | Disposition: A | Discharge status: Home / Self Care | Payer: Medicare Other | Source: Ambulatory Visit | Attending: Family Medicine | Admitting: Family Medicine

## 2021-10-27 DIAGNOSIS — Z1231 Encounter for screening mammogram for malignant neoplasm of breast: Secondary | ICD-10-CM

## 2021-11-10 DIAGNOSIS — H25813 Combined forms of age-related cataract, bilateral: Secondary | ICD-10-CM | POA: Diagnosis not present

## 2021-11-10 DIAGNOSIS — H5203 Hypermetropia, bilateral: Secondary | ICD-10-CM | POA: Diagnosis not present

## 2021-11-10 DIAGNOSIS — H02834 Dermatochalasis of left upper eyelid: Secondary | ICD-10-CM | POA: Diagnosis not present

## 2021-11-10 DIAGNOSIS — H02831 Dermatochalasis of right upper eyelid: Secondary | ICD-10-CM | POA: Diagnosis not present

## 2021-11-10 DIAGNOSIS — H52203 Unspecified astigmatism, bilateral: Secondary | ICD-10-CM | POA: Diagnosis not present

## 2021-12-28 ENCOUNTER — Emergency Department (HOSPITAL_COMMUNITY): Payer: Medicare Other

## 2021-12-28 ENCOUNTER — Emergency Department (HOSPITAL_COMMUNITY)
Admission: EM | Admit: 2021-12-28 | Discharge: 2021-12-29 | Disposition: A | Discharge status: Home / Self Care | Payer: Medicare Other | Attending: Emergency Medicine | Admitting: Emergency Medicine

## 2021-12-28 DIAGNOSIS — M47812 Spondylosis without myelopathy or radiculopathy, cervical region: Secondary | ICD-10-CM | POA: Diagnosis not present

## 2021-12-28 DIAGNOSIS — I10 Essential (primary) hypertension: Secondary | ICD-10-CM | POA: Insufficient documentation

## 2021-12-28 DIAGNOSIS — Z79899 Other long term (current) drug therapy: Secondary | ICD-10-CM | POA: Insufficient documentation

## 2021-12-28 DIAGNOSIS — J449 Chronic obstructive pulmonary disease, unspecified: Secondary | ICD-10-CM | POA: Insufficient documentation

## 2021-12-28 DIAGNOSIS — R200 Anesthesia of skin: Secondary | ICD-10-CM | POA: Diagnosis present

## 2021-12-28 DIAGNOSIS — M4312 Spondylolisthesis, cervical region: Secondary | ICD-10-CM | POA: Diagnosis not present

## 2021-12-28 DIAGNOSIS — G459 Transient cerebral ischemic attack, unspecified: Secondary | ICD-10-CM | POA: Diagnosis not present

## 2021-12-28 DIAGNOSIS — M4802 Spinal stenosis, cervical region: Secondary | ICD-10-CM | POA: Diagnosis not present

## 2021-12-28 DIAGNOSIS — G319 Degenerative disease of nervous system, unspecified: Secondary | ICD-10-CM | POA: Diagnosis not present

## 2021-12-28 LAB — I-STAT CHEM 8, ED
BUN: 7 mg/dL — ABNORMAL LOW (ref 8–23)
Calcium, Ion: 1.17 mmol/L (ref 1.15–1.40)
Chloride: 97 mmol/L — ABNORMAL LOW (ref 98–111)
Creatinine, Ser: 0.7 mg/dL (ref 0.44–1.00)
Glucose, Bld: 100 mg/dL — ABNORMAL HIGH (ref 70–99)
HCT: 37 % (ref 36.0–46.0)
Hemoglobin: 12.6 g/dL (ref 12.0–15.0)
Potassium: 3.6 mmol/L (ref 3.5–5.1)
Sodium: 135 mmol/L (ref 135–145)
TCO2: 29 mmol/L (ref 22–32)

## 2021-12-28 LAB — COMPREHENSIVE METABOLIC PANEL
ALT: 20 U/L (ref 0–44)
AST: 28 U/L (ref 15–41)
Albumin: 4.1 g/dL (ref 3.5–5.0)
Alkaline Phosphatase: 80 U/L (ref 38–126)
Anion gap: 8 (ref 5–15)
BUN: 7 mg/dL — ABNORMAL LOW (ref 8–23)
CO2: 28 mmol/L (ref 22–32)
Calcium: 9.9 mg/dL (ref 8.9–10.3)
Chloride: 98 mmol/L (ref 98–111)
Creatinine, Ser: 0.77 mg/dL (ref 0.44–1.00)
GFR, Estimated: >60 mL/min (ref >60)
Glucose, Bld: 103 mg/dL — ABNORMAL HIGH (ref 70–99)
Potassium: 3.5 mmol/L (ref 3.5–5.1)
Sodium: 134 mmol/L — ABNORMAL LOW (ref 135–145)
Total Bilirubin: 0.4 mg/dL (ref 0.3–1.2)
Total Protein: 6.9 g/dL (ref 6.5–8.1)

## 2021-12-28 LAB — DIFFERENTIAL
Abs Immature Granulocytes: 0.01 10*3/uL (ref 0.00–0.07)
Basophils Absolute: 0 10*3/uL (ref 0.0–0.1)
Basophils Relative: 0 %
Eosinophils Absolute: 0 10*3/uL (ref 0.0–0.5)
Eosinophils Relative: 0 %
Immature Granulocytes: 0 %
Lymphocytes Relative: 20 %
Lymphs Abs: 1.1 10*3/uL (ref 0.7–4.0)
Monocytes Absolute: 0.4 10*3/uL (ref 0.1–1.0)
Monocytes Relative: 8 %
Neutro Abs: 3.8 10*3/uL (ref 1.7–7.7)
Neutrophils Relative %: 72 %

## 2021-12-28 LAB — CBC
HCT: 38.1 % (ref 36.0–46.0)
Hemoglobin: 12.8 g/dL (ref 12.0–15.0)
MCH: 31.3 pg (ref 26.0–34.0)
MCHC: 33.6 g/dL (ref 30.0–36.0)
MCV: 93.2 fL (ref 80.0–100.0)
Platelets: 334 10*3/uL (ref 150–400)
RBC: 4.09 MIL/uL (ref 3.87–5.11)
RDW: 12.5 % (ref 11.5–15.5)
WBC: 5.3 10*3/uL (ref 4.0–10.5)
nRBC: 0 % (ref 0.0–0.2)

## 2021-12-28 LAB — PROTIME-INR
INR: 1 (ref 0.8–1.2)
Prothrombin Time: 12.8 seconds (ref 11.4–15.2)

## 2021-12-28 LAB — ETHANOL: Alcohol, Ethyl (B): <10 mg/dL (ref <10)

## 2021-12-28 LAB — APTT: aPTT: 28 seconds (ref 24–36)

## 2021-12-28 MED ORDER — PREDNISONE 20 MG PO TABS
20.0000 mg | ORAL_TABLET | Freq: Once | ORAL | Status: AC
  Administered 2021-12-28: 20 mg via ORAL
  Filled 2021-12-28: qty 1

## 2021-12-28 MED ORDER — SODIUM CHLORIDE 0.9% FLUSH
3.0000 mL | Freq: Once | INTRAVENOUS | Status: AC
  Administered 2021-12-28: 3 mL via INTRAVENOUS

## 2021-12-28 MED ORDER — LORAZEPAM 2 MG/ML IJ SOLN
1.0000 mg | Freq: Once | INTRAMUSCULAR | Status: AC | PRN
  Administered 2021-12-28: 1 mg via INTRAVENOUS
  Filled 2021-12-28: qty 1

## 2021-12-28 MED ORDER — PREDNISONE 20 MG PO TABS: 20.0000 mg | ORAL_TABLET | Freq: Every day | ORAL | 0 refills | Status: DC

## 2021-12-28 NOTE — ED Triage Notes (Signed)
Pt arrives via POV. Pt reports around 1130 this morning, she had a sudden onset of numbness and tingling in her left forearm and hand that resolved after about 30 mins. Not long after those symptoms resolved, she began having a headache and numbness to the left side of her face and tongue. Those symptoms have resolved at this moment as well. Pt is alert and oriented x4. Pt states she did go to the fire dept and her BP was elevated. No focal neuro deficits noted at this time. NAD.

## 2021-12-28 NOTE — ED Provider Notes (Signed)
[facility] EMERGENCY DEPARTMENT Provider Note   CSN: [id number] Arrival date & time: 12/28/21  1305     History  Chief Complaint  Patient presents with   Numbness    Kelly Snyder is a 76 y.o. female.  Pt is a 76 yo female with a pmhx significant for htn, copd, and arthritis.  Pt said she developed left forearm numbness and tingling at 1130.  She said that lasted about 30 minutes.  She then developed left facial numbness which also lasted about 30 minutes.  She said the left side of her tongue was numb and she accidentally bit it.  Pt went to the fire station where she had her bp checked.  She said it was elevated.  She was told to come here for further eval.  She is back to normal now.       Home Medications Prior to Admission medications   Medication Sig Start Date End Date Taking? Authorizing Provider  predniSONE (DELTASONE) 20 MG tablet Take 1 tablet (20 mg total) by mouth daily. 12/28/21  Yes Jacalyn Lefevre, MD  acetaminophen (TYLENOL) 500 MG tablet Take 1,000 mg by mouth every 6 (six) hours as needed for moderate pain (pain).     [provider]  albuterol (VENTOLIN HFA) 108 (90 Base) MCG/ACT inhaler Inhale 2 puffs into the lungs every 6 (six) hours as needed for wheezing or shortness of breath. 01/22/19   Autry-Lott, Randa Evens, DO  alendronate (FOSAMAX) 10 MG tablet Take 1 tablet (10 mg total) by mouth daily before breakfast. Take with a full glass of water on an empty stomach. Patient not taking: Reported on 04/28/2021 02/18/20   Orpah Cobb P, DO  Calcium Carb-Cholecalciferol (CALCIUM 600-D PO) Take 1 tablet by mouth daily.    [provider]  hydrochlorothiazide (HYDRODIURIL) 12.5 MG tablet TAKE 1/2 TABLET BY MOUTH DAILY 10/06/21   Dameron, Nolberto Hanlon, DO  Multiple Vitamin (MULTIVITAMIN WITH MINERALS) TABS tablet Take 1 tablet by mouth daily.    [provider]  SPIRIVA RESPIMAT 2.5 MCG/ACT AERS INHALE 2 PUFFS BY MOUTH INTO THE  LUNGS DAILY 09/09/21   Erick Alley, DO      Allergies    Patient has no known allergies.    Review of Systems   Review of Systems  Neurological:  Positive for numbness.  All other systems reviewed and are negative.   Physical Exam Updated Vital Signs BP (!) 155/66 (BP Location: Right Arm)   Pulse 80   Temp 98.3 F (36.8 C) (Oral)   Resp 16   Ht 5\' 3"  (1.6 m)   Wt 70.3 kg   SpO2 97%   BMI 27.46 kg/m  Physical Exam Vitals and nursing note reviewed.  Constitutional:      Appearance: Normal appearance.  HENT:     Head: Normocephalic and atraumatic.     Right Ear: External ear normal.     Left Ear: External ear normal.     Nose: Nose normal.     Mouth/Throat:     Mouth: Mucous membranes are moist.     Pharynx: Oropharynx is clear.  Eyes:     Extraocular Movements: Extraocular movements intact.     Conjunctiva/sclera: Conjunctivae normal.     Pupils: Pupils are equal, round, and reactive to light.  Cardiovascular:     Rate and Rhythm: Normal rate and regular rhythm.     Pulses: Normal pulses.     Heart sounds: Normal heart sounds.  Pulmonary:  Effort: Pulmonary effort is normal.     Breath sounds: Normal breath sounds.  Abdominal:     General: Abdomen is flat. Bowel sounds are normal.     Palpations: Abdomen is soft.  Musculoskeletal:        General: Normal range of motion.     Cervical back: Normal range of motion and neck supple.  Skin:    General: Skin is warm.     Capillary Refill: Capillary refill takes less than 2 seconds.  Neurological:     General: No focal deficit present.     Mental Status: She is alert and oriented to person, place, and time.  Psychiatric:        Mood and Affect: Mood normal.        Behavior: Behavior normal.     ED Results / Procedures / Treatments   Labs (all labs ordered are listed, but only abnormal results are displayed) Labs Reviewed  COMPREHENSIVE METABOLIC PANEL - Abnormal; Notable for the following components:       Result Value   Sodium 134 (*)    Glucose, Bld 103 (*)    BUN 7 (*)    All other components within normal limits  I-STAT CHEM 8, ED - Abnormal; Notable for the following components:   Chloride 97 (*)    BUN 7 (*)    Glucose, Bld 100 (*)    All other components within normal limits  PROTIME-INR  APTT  CBC  DIFFERENTIAL  ETHANOL  CBG MONITORING, ED    EKG EKG Interpretation  Date/Time:  Monday December 28 2021 14:13:43 EST Ventricular Rate:  84 PR Interval:  134 QRS Duration: 82 QT Interval:  352 QTC Calculation: 415 R Axis:   93 Text Interpretation: Normal sinus rhythm with sinus arrhythmia Rightward axis Borderline ECG When compared with ECG of 03-Nov-2018 03:41, PREVIOUS ECG IS PRESENT No significant change since last tracing Confirmed by Jacalyn LefevreHaviland, Kalyssa Anker [phone] on 12/28/2021 5:10:18 PM  Radiology MR Cervical Spine Wo Contrast  Result Date: 12/28/2021 CLINICAL DATA:  Initial evaluation for cervical radiculopathy, left upper extremity numbness. EXAM: MRI CERVICAL SPINE WITHOUT CONTRAST TECHNIQUE: Multiplanar, multisequence MR imaging of the cervical spine was performed. No intravenous contrast was administered. COMPARISON:  None Available. FINDINGS: Alignment: Examination degraded by motion artifact. Straightening with mild reversal of the normal cervical lordosis. Trace grade 1 anterolisthesis of C4 on C5 and C7 on T1. Vertebrae: Vertebral body height maintained without acute or chronic fracture. Bone marrow signal intensity within normal limits. No worrisome osseous lesions. Discogenic reactive endplate changes with marrow edema present about the C3-4 and C6-7 interspaces. Cord: Normal signal and morphology. Posterior Fossa, vertebral arteries, paraspinal tissues: Unremarkable. Disc levels: C2-C3: Unremarkable. C3-C4: Degenerative intervertebral disc space narrowing with diffuse disc osteophyte complex. Flattening and partial effacement of the ventral thecal sac. Mild facet and  ligament flavum hypertrophy. Resultant mild spinal stenosis. Moderate left C4 foraminal narrowing. Right neural foramen remains patent. C4-C5: Anterolisthesis with mild disc bulge and uncovertebral spurring. Mild facet hypertrophy. Resultant mild spinal stenosis. Foramina remain grossly patent. C5-C6: Degenerative intervertebral disc space narrowing with diffuse disc osteophyte complex. Flattening and partial effacement of the ventral thecal sac. Mild facet and ligament flavum hypertrophy. Mild cord flattening without cord signal changes. Resultant mild-to-moderate spinal stenosis, with moderate bilateral C6 foraminal narrowing. C6-C7: Degenerative intervertebral disc space narrowing with diffuse disc osteophyte complex. Flattening of the ventral thecal sac. Mild ligament flavum hypertrophy. Resultant mild spinal stenosis. Mild right C7 foraminal narrowing. Left  neural foramen remains patent. C7-T1: Anterolisthesis. Negative interspace. Mild bilateral facet hypertrophy. No spinal stenosis. Foramina remain patent. IMPRESSION: 1. Motion degraded exam. 2. Multilevel cervical spondylosis with resultant mild to moderate diffuse spinal stenosis at C3-4 through C6-7, most pronounced at C5-6. 3. Multifactorial degenerative changes with resultant multilevel foraminal narrowing as above. Notable findings include moderate left C4 foraminal stenosis, with moderate bilateral C6 foraminal narrowing. 4. Discogenic reactive endplate changes with marrow edema about the C3-4 and C6-7 interspaces. Finding could contribute to underlying neck pain. Electronically Signed   By: Rise Mu M.D.   On: 12/28/2021 23:05   MR BRAIN WO CONTRAST  Result Date: 12/28/2021 CLINICAL DATA:  Initial evaluation for neuro deficit, stroke suspected. EXAM: MRI HEAD WITHOUT CONTRAST TECHNIQUE: Multiplanar, multiecho pulse sequences of the brain and surrounding structures were obtained without intravenous contrast. COMPARISON:  Prior CT from  earlier the same day. FINDINGS: Brain: Mild age-related cerebral atrophy with chronic small vessel ischemic disease. No acute or subacute infarct. Gray-white matter differentiation maintained. No areas of chronic cortical infarction. No acute or chronic intracranial blood products. No mass lesion, midline shift or mass effect. No hydrocephalus or extra-axial fluid collection. Pituitary gland and suprasellar region within normal limits. Vascular: Major intracranial vascular flow voids are maintained. Skull and upper cervical spine: Minimal cerebellar tonsillar ectopia without frank Chiari malformation. Bone marrow signal intensity normal. No scalp soft tissue abnormality. Sinuses/Orbits: Globes and orbital soft tissues within normal limits. Paranasal sinuses are clear. No mastoid effusion. Other: None. IMPRESSION: 1. No acute intracranial abnormality. 2. Mild age-related cerebral atrophy with chronic small vessel ischemic disease. Electronically Signed   By: Rise Mu M.D.   On: 12/28/2021 22:59   CT HEAD WO CONTRAST  Result Date: 12/28/2021 CLINICAL DATA:  TIA EXAM: CT HEAD WITHOUT CONTRAST TECHNIQUE: Contiguous axial images were obtained from the base of the skull through the vertex without intravenous contrast. RADIATION DOSE REDUCTION: This exam was performed according to the departmental dose-optimization program which includes automated exposure control, adjustment of the mA and/or kV according to patient size and/or use of iterative reconstruction technique. COMPARISON:  None Available. FINDINGS: Brain: No evidence of acute infarction, hemorrhage, hydrocephalus, extra-axial collection or mass lesion/mass effect. Vascular: No hyperdense vessel or unexpected calcification. Skull: Normal. Negative for fracture or focal lesion. Sinuses/Orbits: No acute finding. Other: None. IMPRESSION: No hemorrhage or CT evidence of an acute infarct. Electronically Signed   By: Lorenza Cambridge M.D.   On: 12/28/2021  16:47    Procedures Procedures    Medications Ordered in ED Medications  predniSONE (DELTASONE) tablet 20 mg (has no administration in time range)  sodium chloride flush (NS) 0.9 % injection 3 mL (3 mLs Intravenous Given 12/28/21 1858)  LORazepam (ATIVAN) injection 1 mg (1 mg Intravenous Given 12/28/21 1858)    ED Course/ Medical Decision Making/ A&P                           Medical Decision Making Amount and/or Complexity of Data Reviewed Radiology: ordered.  Risk Prescription drug management.   This patient presents to the ED for concern of numbness, this involves an extensive number of treatment options, and is a complaint that carries with it a high risk of complications and morbidity.  The differential diagnosis includes cva, tia, electrolyte abn   Co morbidities that complicate the patient evaluation  htn, copd, and arthritis   Additional history obtained:  Additional history obtained from epic chart  review    Lab Tests:  I Ordered, and personally interpreted labs.  The pertinent results include:  cbc nl, cmp nl, inr nl, etoh neg,    Imaging Studies ordered:  I ordered imaging studies including ct head, mri head/c-spine  I independently visualized and interpreted imaging which showed  CT head: No hemorrhage or CT evidence of an acute infarct.  MRI brain: 1. No acute intracranial abnormality.  2. Mild age-related cerebral atrophy with chronic small vessel  ischemic disease.  MRI cervical spine: 1. Motion degraded exam.  2. Multilevel cervical spondylosis with resultant mild to moderate  diffuse spinal stenosis at C3-4 through C6-7, most pronounced at  C5-6.  3. Multifactorial degenerative changes with resultant multilevel  foraminal narrowing as above. Notable findings include moderate left  C4 foraminal stenosis, with moderate bilateral C6 foraminal  narrowing.  4. Discogenic reactive endplate changes with marrow edema about the  C3-4 and C6-7  interspaces. Finding could contribute to underlying  neck pain.   I agree with the radiologist interpretation   Cardiac Monitoring:  The patient was maintained on a cardiac monitor.  I personally viewed and interpreted the cardiac monitored which showed an underlying rhythm of: nsr   Medicines ordered and prescription drug management:  I ordered medication including prednisone  for spinal stenosis  Reevaluation of the patient after these medicines showed that the patient improved I have reviewed the patients home medicines and have made adjustments as needed   Test Considered:  mri    Problem List / ED Course:  Spinal stenosis:  this is likely the cause of sx.  Prednisone started.  Pt told to f/u with ns.  Return if worse.  F/u with pcp/ns.   Reevaluation:  After the interventions noted above, I reevaluated the patient and found that they have :improved   Social Determinants of Health:  Lives at home   Dispostion:  After consideration of the diagnostic results and the patients response to treatment, I feel that the patent would benefit from discharge with outpatient f/u.          Final Clinical Impression(s) / ED Diagnoses Final diagnoses:  Cervical stenosis of spine    Rx / DC Orders ED Discharge Orders          Ordered    predniSONE (DELTASONE) 20 MG tablet  Daily        12/28/21 2320              Jacalyn Lefevre, MD 12/28/21 2324

## 2021-12-28 NOTE — ED Notes (Signed)
Patient transported to MRI 

## 2021-12-28 NOTE — ED Provider Triage Note (Signed)
Emergency Medicine Provider Triage Evaluation Note  Kelly Snyder , a 76 y.o. female  was evaluated in triage.  Pt complains of sudden onset of numbness and tingling in her left forearm and hand that began around 1130 this morning.  She states this resolved after about 30 minutes.  Not long after the symptoms resolved, she began having an optical headache and numbness to the left side of her face and tongue.  Patient reports the symptoms have all resolved within an hour after they began.  Patient went to the fire department and was told her BP was elevated.  Patient has no history of prior CVAs, is not anticoagulated, is not currently complaining of numbness or headache.  Review of Systems  Positive: As above Negative: As above  Physical Exam  BP (!) 182/83   Pulse 97   Temp 98.3 F (36.8 C) (Oral)   Resp 16   Ht 5\' 3"  (1.6 m)   Wt 70.3 kg   SpO2 97%   BMI 27.46 kg/m  Gen:   Awake, no distress   Resp:  Normal effort  MSK:   Moves extremities without difficulty  Other:  No slurring of speech, no facial droop, sensation is intact bilaterally to face and upper extremities, equal grip strength, equal strength bilaterally for upper and lower extremities, no focal neurodeficits  Medical Decision Making  Medically screening exam initiated at 2:27 PM.  Appropriate orders placed.  Kelly Snyder was informed that the remainder of the evaluation will be completed by another provider, this initial triage assessment does not replace that evaluation, and the importance of remaining in the ED until their evaluation is complete.     Archer Asa R, Melton Alar 12/28/21 1429

## 2021-12-31 ENCOUNTER — Other Ambulatory Visit: Payer: Self-pay | Admitting: Student

## 2021-12-31 DIAGNOSIS — I10 Essential (primary) hypertension: Secondary | ICD-10-CM

## 2022-01-01 ENCOUNTER — Ambulatory Visit (INDEPENDENT_AMBULATORY_CARE_PROVIDER_SITE_OTHER): Payer: Medicare Other | Admitting: Student

## 2022-01-01 ENCOUNTER — Encounter: Payer: Self-pay | Admitting: Student

## 2022-01-01 ENCOUNTER — Other Ambulatory Visit: Payer: Self-pay

## 2022-01-01 VITALS — BP 140/65 | HR 73 | Wt 161.0 lb

## 2022-01-01 DIAGNOSIS — M4802 Spinal stenosis, cervical region: Secondary | ICD-10-CM | POA: Diagnosis not present

## 2022-01-01 DIAGNOSIS — Z122 Encounter for screening for malignant neoplasm of respiratory organs: Secondary | ICD-10-CM

## 2022-01-01 DIAGNOSIS — Z87891 Personal history of nicotine dependence: Secondary | ICD-10-CM

## 2022-01-01 DIAGNOSIS — I10 Essential (primary) hypertension: Secondary | ICD-10-CM

## 2022-01-01 NOTE — Progress Notes (Cosign Needed)
    SUBJECTIVE:   CHIEF COMPLAINT / HPI:   Numbness  Pt seen in ED on 12/28/2021 for L facial numbness, L sided tongue numbness, L forearm numbness. The numbness stopped before she got to the ED. Head CT showed no acute abnormality. The numbness has not returned. MRI cervical spine showed stenosis. She was given 5 days of prednisone 20 mg. She was referred to a neurologist and has an apt on Jan. 8th.   HTN Takes HCTZ 12.5 mg daily. BP elevated today but pt states she feels well.    PERTINENT  PMH / PSH: HTN, COPD  OBJECTIVE:   BP (!) 140/65   Pulse 73   Wt 161 lb (73 kg)   SpO2 97%   BMI 28.52 kg/m    General: NAD, pleasant, able to participate in exam Cardiac: RRR, no murmurs. Respiratory: CTAB, normal effort, No wheezes, rales or rhonchi Extremities: no edema of BLEs Skin: warm and dry Neuro: alert, no obvious focal deficits Psych: Normal affect and mood  ASSESSMENT/PLAN:   Spinal stenosis of cervical region -Continue with plan to follow with neurology on January 8  Hypertension Blood pressure decreased on repeat to 140/65.  Will continue HCTZ 12.5 mg daily. From AVS:  - Check your blood pressure at home once daily and record it. If it is consistently higher than 140/90, please call to come in  for a sooner appointment. Otherwise, I will see you on Feb 2. Please bring your blood pressure cuff to your next appointment so we can make sure it is working properly.   History of tobacco abuse Annual low dose CT for lung cancer screening ordered.      Dr. Erick Alley, DO [facility]

## 2022-01-01 NOTE — Patient Instructions (Signed)
It was great to see you! Thank you for allowing me to participate in your care!  I recommend that you always bring your medications to each appointment as this makes it easy to ensure you are on the correct medications and helps [place] not miss when refills are needed.  Our plans for today:  - Continue taking your current medications - Check your blood pressure at home once daily and record it. If it is consistently higher than 140/90, please call to come in  for a sooner appointment. Otherwise, I will see you on Feb 2. Please bring your blood pressure cuff to your next appointment so we can make sure it is working properly.   Take care and seek immediate care sooner if you develop any concerns.   Dr. Erick Alley, DO [facility]

## 2022-01-01 NOTE — Assessment & Plan Note (Signed)
Blood pressure decreased on repeat to 140/65.  Will continue HCTZ 12.5 mg daily. From AVS:  - Check your blood pressure at home once daily and record it. If it is consistently higher than 140/90, please call to come in  for a sooner appointment. Otherwise, I will see you on Feb 2. Please bring your blood pressure cuff to your next appointment so we can make sure it is working properly.

## 2022-01-01 NOTE — Assessment & Plan Note (Signed)
-  Continue with plan to follow with neurology on January 8

## 2022-01-01 NOTE — Assessment & Plan Note (Signed)
Annual low dose CT for lung cancer screening ordered.

## 2022-01-01 NOTE — Assessment & Plan Note (Signed)
>>  ASSESSMENT AND PLAN FOR HYPERTENSION WRITTEN ON 01/01/2022  6:09 PM BY JOSHUA, SARAH, DO  Blood pressure decreased on repeat to 140/65.  Will continue HCTZ 12.5 mg daily. From AVS:  - Check your blood pressure at home once daily and record it. If it is consistently higher than 140/90, please call to come in  for a sooner appointment. Otherwise, I will see you on Feb 2. Please bring your blood pressure cuff to your next appointment so we can make sure it is working properly.

## 2022-01-06 ENCOUNTER — Telehealth: Payer: Self-pay

## 2022-01-06 NOTE — Telephone Encounter (Signed)
        Patient  visited [place]  on 12/26   Telephone encounter attempt :  1st  A HIPAA compliant voice message was left requesting a return call.  Instructed patient to call back    Trula Frede Pop Health Care Guide, [employer], Care Management  [phone] [address], , [place] [address] Phone: [phone] Email: [email or web address]        

## 2022-01-07 ENCOUNTER — Telehealth: Payer: Self-pay

## 2022-01-07 NOTE — Telephone Encounter (Signed)
        Patient  visited [place] on 12/26   Telephone encounter attempt :  2nd  A HIPAA compliant voice message was left requesting a return call.  Instructed patient to call back     Kelly Snyder Pop Health Care Guide, [place], Care Management  [phone] [address], [place], [place] [address] Phone: [phone] Email: [email or web address]       

## 2022-01-11 DIAGNOSIS — M4302 Spondylolysis, cervical region: Secondary | ICD-10-CM | POA: Diagnosis not present

## 2022-01-17 ENCOUNTER — Other Ambulatory Visit: Payer: Self-pay | Admitting: Student

## 2022-01-17 DIAGNOSIS — J439 Emphysema, unspecified: Secondary | ICD-10-CM

## 2022-02-02 ENCOUNTER — Ambulatory Visit
Admission: RE | Admit: 2022-02-02 | Discharge: 2022-02-02 | Disposition: A | Discharge status: Home / Self Care | Payer: 59 | Source: Ambulatory Visit | Attending: Family Medicine | Admitting: Family Medicine

## 2022-02-02 DIAGNOSIS — Z87891 Personal history of nicotine dependence: Secondary | ICD-10-CM | POA: Diagnosis not present

## 2022-02-02 DIAGNOSIS — Z122 Encounter for screening for malignant neoplasm of respiratory organs: Secondary | ICD-10-CM

## 2022-02-03 NOTE — Progress Notes (Unsigned)
    SUBJECTIVE:   CHIEF COMPLAINT / HPI:   HTN Medication includes HCTZ 12.5 mg daily.  She checks her blood pressures at home and they have been ranging 3-TDSKA-768'T systolic but mostly 157'W and and diastolic WNL. No chest pain, no blurry vision, no worsening SOB.  PERTINENT  PMH / PSH: ***  OBJECTIVE:   Vitals:   02/05/22 0941  BP: (!) 171/63  Pulse: 80  SpO2: 95%     General: NAD, pleasant, able to participate in exam Cardiac: RRR, no murmurs. Respiratory: CTAB, normal effort, No wheezes, rales or rhonchi Abdomen: Bowel sounds present, nontender, nondistended, no hepatosplenomegaly. Extremities: no edema or cyanosis. Skin: warm and dry, no rashes noted Neuro: alert, no obvious focal deficits Psych: Normal affect and mood  ASSESSMENT/PLAN:   No problem-specific Assessment & Plan notes found for this encounter.     Dr. Precious Gilding, [place]    {    This will disappear when note is signed, click to select method of visit    :1}

## 2022-02-05 ENCOUNTER — Encounter: Payer: Self-pay | Admitting: Student

## 2022-02-05 ENCOUNTER — Other Ambulatory Visit: Payer: Self-pay

## 2022-02-05 ENCOUNTER — Ambulatory Visit (INDEPENDENT_AMBULATORY_CARE_PROVIDER_SITE_OTHER): Payer: 59 | Admitting: Student

## 2022-02-05 VITALS — BP 156/72 | HR 80 | Ht 63.0 in | Wt 157.2 lb

## 2022-02-05 DIAGNOSIS — I10 Essential (primary) hypertension: Secondary | ICD-10-CM

## 2022-02-05 NOTE — Patient Instructions (Addendum)
It was great to see you! Thank you for allowing me to participate in your care!  I recommend that you always bring your medications to each appointment as this makes it easy to ensure you are on the correct medications and helps [place] not miss when refills are needed.  Our plans for today:  - Continue your current medications - Check you blood pressure at home a couple times a week or if you are feeling unwell -Goal: less than 140/90. If you are higher than this consistently, please make an appointment, otherwise return in 4 months for a follow up visit    Take care and seek immediate care sooner if you develop any concerns.   Dr. Precious Gilding, DO [facility]

## 2022-02-06 NOTE — Assessment & Plan Note (Signed)
Home BP readings are largely at or under goal.  Will not alter BP medications today.  I suspect elevated blood pressure in office could be related to whitecoat hypertension. From AVS - Continue your current medications - Check you blood pressure at home a couple times a week or if you are feeling unwell -Goal: less than 140/90. If you are higher than this consistently, please make an appointment, otherwise return in 4 months for a follow up visit

## 2022-02-06 NOTE — Assessment & Plan Note (Signed)
>>  ASSESSMENT AND PLAN FOR HYPERTENSION WRITTEN ON 02/06/2022 10:12 PM BY JONES, SARAH, DO  Home BP readings are largely at or under goal.  Will not alter BP medications today.  I suspect elevated blood pressure in office could be related to whitecoat hypertension. From AVS - Continue your current medications - Check you blood pressure at home a couple times a week or if you are feeling unwell -Goal: less than 140/90. If you are higher than this consistently, please make an appointment, otherwise return in 4 months for a follow up visit

## 2022-03-25 ENCOUNTER — Other Ambulatory Visit: Payer: Self-pay | Admitting: Student

## 2022-03-25 DIAGNOSIS — I10 Essential (primary) hypertension: Secondary | ICD-10-CM

## 2022-05-04 ENCOUNTER — Telehealth: Payer: Self-pay | Admitting: Student

## 2022-05-04 NOTE — Telephone Encounter (Signed)
Called patient to schedule Medicare Annual Wellness Visit (AWV). Left message for patient to call back and schedule Medicare Annual Wellness Visit (AWV).  Last date of AWV: 04/28/2021   Please schedule an AWVS appointment at any time with [facility] VISIT.  If any questions, please contact me at [phone].    Thank you,  [facility] Support [facility] Direct dial  [phone]

## 2022-05-17 ENCOUNTER — Other Ambulatory Visit: Payer: Self-pay

## 2022-05-17 MED ORDER — ALBUTEROL SULFATE HFA 108 (90 BASE) MCG/ACT IN AERS: 2.0000 | INHALATION_SPRAY | Freq: Four times a day (QID) | RESPIRATORY_TRACT | 1 refills | Status: DC | PRN

## 2022-05-17 NOTE — Telephone Encounter (Signed)
Contacted KYNZLEY VEGTER to schedule their annual wellness visit. Appointment made for 05/21/2022.  Thank you,  [facility] Support [facility] Direct dial  [phone]

## 2022-05-21 ENCOUNTER — Ambulatory Visit (INDEPENDENT_AMBULATORY_CARE_PROVIDER_SITE_OTHER): Payer: 59

## 2022-05-21 VITALS — Ht 63.0 in | Wt 157.0 lb

## 2022-05-21 DIAGNOSIS — Z0000 Encounter for general adult medical examination without abnormal findings: Secondary | ICD-10-CM | POA: Diagnosis not present

## 2022-05-21 DIAGNOSIS — Z1231 Encounter for screening mammogram for malignant neoplasm of breast: Secondary | ICD-10-CM

## 2022-05-21 NOTE — Progress Notes (Addendum)
Subjective:   Kelly Snyder is a 77 y.o. female who presents for Medicare Annual (Subsequent) preventive examination.  I connected with  Kelly Snyder on 05/21/22 by a audio enabled telemedicine application and verified that I am speaking with the correct person using two identifiers.  Patient Location: Home  Provider Location: Home Office  I discussed the limitations of evaluation and management by telemedicine. The patient expressed understanding and agreed to proceed.  Review of Systems     Cardiac Risk Factors include: advanced age (>35men, >30 women);hypertension     Objective:    Today's Vitals   There is no height or weight on file to calculate BMI.     02/05/2022    9:44 AM 01/01/2022   11:47 AM 04/28/2021    1:18 PM 02/04/2021    9:33 AM 11/05/2020    9:45 AM 02/18/2020   10:03 AM 12/28/2018   10:33 AM  Advanced Directives  Does Patient Have a Medical Advance Directive? No No No No No No No  Would patient like information on creating a medical advance directive? No - Patient declined No - Patient declined Yes (MAU/Ambulatory/Procedural Areas - Information given) No - Patient declined No - Patient declined No - Patient declined No - Patient declined    Current Medications (verified) Outpatient Encounter Medications as of 05/21/2022  Medication Sig   acetaminophen (TYLENOL) 500 MG tablet Take 1,000 mg by mouth every 6 (six) hours as needed for moderate pain (pain).    albuterol (VENTOLIN HFA) 108 (90 Base) MCG/ACT inhaler Inhale 2 puffs into the lungs every 6 (six) hours as needed for wheezing or shortness of breath.   Calcium Carb-Cholecalciferol (CALCIUM 600-D PO) Take 1 tablet by mouth daily.   hydrochlorothiazide (HYDRODIURIL) 12.5 MG tablet TAKE 1/2 TABLET BY MOUTH DAILY   Multiple Vitamin (MULTIVITAMIN WITH MINERALS) TABS tablet Take 1 tablet by mouth daily.   predniSONE (DELTASONE) 20 MG tablet Take 1 tablet (20 mg total) by mouth daily.   Tiotropium Bromide  Monohydrate (SPIRIVA RESPIMAT) 2.5 MCG/ACT AERS INHALE 2 PUFFS BY MOUTH INTO THE LUNGS DAILY   No facility-administered encounter medications on file as of 05/21/2022.    Allergies (verified) Patient has no known allergies.   History: Past Medical History:  Diagnosis Date   Arthritis    COPD (chronic obstructive pulmonary disease) (HCC)    Pneumonia    Past Surgical History:  Procedure Laterality Date   DILATION AND CURETTAGE OF UTERUS  1970   Family History  Problem Relation Age of Onset   Diabetes Mother    Heart disease Mother    Heart disease Father    Parkinson's disease Sister    Dementia Sister    Alcohol abuse Son    Social History   Socioeconomic History   Marital status: Divorced    Spouse name: Not on file   Number of children: 1   Years of education: 12   Highest education level: 12th grade  Occupational History   Occupation: retired  Tobacco Use   Smoking status: Former    Packs/day: 0.50    Years: 49.00    Additional pack years: 0.00    Total pack years: 24.50    Types: Cigarettes    Start date: 01/04/1966    Passive exposure: Past   Smokeless tobacco: Never   Tobacco comments:    quit 10/28/18  Vaping Use   Vaping Use: Never used  Substance and Sexual Activity   Alcohol use: No  Drug use: No   Sexual activity: Not Currently  Other Topics Concern   Not on file  Social History Narrative   Health Care POA: Emergency Contact: Jeannett Senior (son) [phone]       Patient lives with her son and grandson. Patient has dogs and cats.    Patient enjoys gardening and walking for exercise.    Patients sister has Parkinson's Disease. Patient enjoys spending time with her.          Social Determinants of Health   Financial Resource Strain: Low Risk  (04/28/2021)   Overall Financial Resource Strain (CARDIA)    Difficulty of Paying Living Expenses: Not very hard  Food Insecurity: No Food Insecurity (04/28/2021)   Hunger Vital Sign    Worried About Running  Out of Food in the Last Year: Never true    Ran Out of Food in the Last Year: Never true  Transportation Needs: No Transportation Needs (04/28/2021)   PRAPARE - Administrator, Civil Service (Medical): No    Lack of Transportation (Non-Medical): No  Physical Activity: Sufficiently Active (04/28/2021)   Exercise Vital Sign    Days of Exercise per Week: 7 days    Minutes of Exercise per Session: 30 min  Stress: No Stress Concern Present (04/28/2021)   [employer] of Occupational Health - Occupational Stress Questionnaire    Feeling of Stress : Not at all  Social Connections: Moderately Isolated (04/28/2021)   Social Connection and Isolation Panel [NHANES]    Frequency of Communication with Friends and Family: More than three times a week    Frequency of Social Gatherings with Friends and Family: More than three times a week    Attends Religious Services: More than 4 times per year    Active Member of [employer] or Organizations: No    Attends Banker Meetings: Never    Marital Status: Divorced    Tobacco Counseling Counseling given: Not Answered Tobacco comments: quit 10/28/18   Clinical Intake:                 Diabetic?No          Activities of Daily Living    05/21/2022    9:23 AM  In your present state of health, do you have any difficulty performing the following activities:  Hearing? 0  Vision? 0  Difficulty concentrating or making decisions? 0  Walking or climbing stairs? 0  Dressing or bathing? 0  Doing errands, shopping? 0  Preparing Food and eating ? N  Using the Toilet? N  In the past six months, have you accidently leaked urine? N  Do you have problems with loss of bowel control? N  Managing your Medications? N  Managing your Finances? N  Housekeeping or managing your Housekeeping? N    Patient Care Team: Erick Alley, DO as PCP - General (Family Medicine)  Indicate any recent Medical Services you may have received  from other than [facility] providers in the past year (date may be approximate).     Assessment:   This is a routine wellness examination for Kelly Snyder.  Hearing/Vision screen No results found.  Dietary issues and exercise activities discussed: Current Exercise Habits: Home exercise routine, Type of exercise: walking, Time (Minutes): 30, Frequency (Times/Week): 3, Weekly Exercise (Minutes/Week): 90, Intensity: Mild   Goals Addressed   None   Depression Screen    02/05/2022    9:44 AM 04/28/2021    1:18 PM 04/28/2021    1:10 PM  02/04/2021    9:30 AM 10/17/2020    3:35 PM 02/18/2020   10:01 AM 05/07/2019    9:21 AM  PHQ 2/9 Scores  PHQ - 2 Score 0 0 0 0 0 0 0  PHQ- 9 Score 0 1  1 0 1     Fall Risk    02/05/2022    9:44 AM 01/01/2022   11:47 AM 04/28/2021    1:19 PM 02/04/2021    9:30 AM 10/17/2020    3:34 PM  Fall Risk   Falls in the past year? 0 0 0 0 0  Number falls in past yr: 0 0 0 0   Injury with Fall? 0 0 0 0   Risk for fall due to :   Orthopedic patient    Follow up   Falls prevention discussed      FALL RISK PREVENTION PERTAINING TO THE HOME:  Any stairs in or around the home? {YES/NO:21197} If so, are there any without handrails? {YES/NO:21197} Home free of loose throw rugs in walkways, pet beds, electrical cords, etc? {YES/NO:21197} Adequate lighting in your home to reduce risk of falls? {YES/NO:21197}  ASSISTIVE DEVICES UTILIZED TO PREVENT FALLS:  Life alert? {YES/NO:21197} Use of a cane, walker or w/c? {YES/NO:21197} Grab bars in the bathroom? {YES/NO:21197} Shower chair or bench in shower? {YES/NO:21197} Elevated toilet seat or a handicapped toilet? {YES/NO:21197}  TIMED UP AND GO:  Was the test performed? {YES/NO:21197}.  Length of time to ambulate 10 feet: *** sec.   {Appearance of ZOXW:9604540}  Cognitive Function:    06/19/2013    3:00 PM 06/19/2013   11:00 AM  MMSE - Mini Mental State Exam  Orientation to time 4 5  Orientation to Place 5 4   Registration 3 3  Attention/ Calculation 5 5  Recall 2 2  Language- name 2 objects 2 2  Language- repeat 1 1  Language- follow 3 step command 3 3  Language- read & follow direction 1 1  Write a sentence 1 1  Copy design 1 1  Total score 28 28        04/28/2021    1:24 PM  6CIT Screen  What Year? 0 points  What month? 0 points  What time? 0 points  Count back from 20 0 points  Months in reverse 0 points  Repeat phrase 0 points  Total Score 0 points    Immunizations Immunization History  Administered Date(s) Administered   Tdap 10/21/2015    {TDAP status:2101805}  {Flu Vaccine status:2101806}  {Pneumococcal vaccine status:2101807}  {Covid-19 vaccine status:2101808}  Qualifies for Shingles Vaccine? {YES/NO:21197}  Zostavax completed {YES/NO:21197}  {Shingrix Completed?:2101804}  Screening Tests Health Maintenance  Topic Date Due   COVID-19 Vaccine (1) Never done   Zoster Vaccines- Shingrix (1 of 2) 08/21/2022 (Originally 05/12/1964)   Pneumonia Vaccine 60+ Years old (1 of 2 - PCV) 05/21/2023 (Originally 05/12/1951)   INFLUENZA VACCINE  08/05/2022   Lung Cancer Screening  02/03/2023   Medicare Annual Wellness (AWV)  05/21/2023   DTaP/Tdap/Td (2 - Td or Tdap) 10/20/2025   DEXA SCAN  Completed   Hepatitis C Screening  Completed   HPV VACCINES  Aged Out   Fecal DNA (Cologuard)  Discontinued    Health Maintenance  Health Maintenance Due  Topic Date Due   COVID-19 Vaccine (1) Never done    {Colorectal cancer screening:2101809}  {Mammogram status:21018020}  {Bone Density status:21018021}  Lung Cancer Screening: (Low Dose CT Chest recommended if Age 59-80 years,  30 pack-year currently smoking OR have quit w/in 15years.) {DOES NOT does:27190::"does not"} qualify.   Lung Cancer Screening Referral: ***  Additional Screening:  Hepatitis C Screening: {DOES NOT does:27190::"does not"} qualify; Completed ***  Vision Screening: Recommended annual  ophthalmology exams for early detection of glaucoma and other disorders of the eye. Is the patient up to date with their annual eye exam?  {YES/NO:21197} Who is the provider or what is the name of the office in which the patient attends annual eye exams? *** If pt is not established with a provider, would they like to be referred to a provider to establish care? {YES/NO:21197}.   Dental Screening: Recommended annual dental exams for proper oral hygiene  Community Resource Referral / Chronic Care Management: CRR required this visit?  {YES/NO:21197}  CCM required this visit?  {YES/NO:21197}     Plan:     I have personally reviewed and noted the following in the patient's chart:   Medical and social history Use of alcohol, tobacco or illicit drugs  Current medications and supplements including opioid prescriptions. {Opioid Prescriptions:[phone]} Functional ability and status Nutritional status Physical activity Advanced directives List of other physicians Hospitalizations, surgeries, and ER visits in previous 12 months Vitals Screenings to include cognitive, depression, and falls Referrals and appointments  In addition, I have reviewed and discussed with patient certain preventive protocols, quality metrics, and best practice recommendations. A written personalized care plan for preventive services as well as general preventive health recommendations were provided to patient.     Kandis Fantasia [place], [place]   0/98/1191   Nurse Notes: ***

## 2022-05-22 NOTE — Patient Instructions (Signed)
Kelly Snyder , Thank you for taking time to come for your Medicare Wellness Visit. I appreciate your ongoing commitment to your health goals. Please review the following plan we discussed and let me know if I can assist you in the future.   These are the goals we discussed:  Goals      LDL CALC < 100     Remain active and indepedent     Weight (lb) < 140 lb (63.5 kg)     Patient reports she gained weight after she quit smoking.  Patient reports she wants to focus on a healthy diet.         This is a list of the screening recommended for you and due dates:  Health Maintenance  Topic Date Due   COVID-19 Vaccine (1) Never done   Zoster (Shingles) Vaccine (1 of 2) 08/21/2022*   Pneumonia Vaccine (1 of 2 - PCV) 05/21/2023*   Flu Shot  08/05/2022   Screening for Lung Cancer  02/03/2023   Medicare Annual Wellness Visit  05/21/2023   DTaP/Tdap/Td vaccine (2 - Td or Tdap) 10/20/2025   DEXA scan (bone density measurement)  Completed   Hepatitis C Screening: USPSTF Recommendation to screen - Ages 31-79 yo.  Completed   HPV Vaccine  Aged Out   Cologuard (Stool DNA test)  Discontinued  *Topic was postponed. The date shown is not the original due date.    Advanced directives: Information on Advanced Care Planning can be found at [facility] of [place] Advance Health Care Directives Advance Health Care Directives ([email or web address]    Conditions/risks identified: Aim for 30 minutes of exercise or brisk walking, 6-8 glasses of water, and 5 servings of fruits and vegetables each day.   Next appointment: Follow up in one year for your annual wellness visit   The number to schedule your mammogram at [facility] is [phone]    Preventive Care 65 Years and Older, Female Preventive care refers to lifestyle choices and visits with your health care provider that can promote health and wellness. What does preventive care include? A yearly physical exam. This is also called an annual  well check. Dental exams once or twice a year. Routine eye exams. Ask your health care provider how often you should have your eyes checked. Personal lifestyle choices, including: Daily care of your teeth and gums. Regular physical activity. Eating a healthy diet. Avoiding tobacco and drug use. Limiting alcohol use. Practicing safe sex. Taking low-dose aspirin every day. Taking vitamin and mineral supplements as recommended by your health care provider. What happens during an annual well check? The services and screenings done by your health care provider during your annual well check will depend on your age, overall health, lifestyle risk factors, and family history of disease. Counseling  Your health care provider may ask you questions about your: Alcohol use. Tobacco use. Drug use. Emotional well-being. Home and relationship well-being. Sexual activity. Eating habits. History of falls. Memory and ability to understand (cognition). Work and work Astronomer. Reproductive health. Screening  You may have the following tests or measurements: Height, weight, and BMI. Blood pressure. Lipid and cholesterol levels. These may be checked every 5 years, or more frequently if you are over 3 years old. Skin check. Lung cancer screening. You may have this screening every year starting at age 67 if you have a 30-pack-year history of smoking and currently smoke or have quit within the past 15 years. Fecal occult blood test (FOBT) of  the stool. You may have this test every year starting at age 22. Flexible sigmoidoscopy or colonoscopy. You may have a sigmoidoscopy every 5 years or a colonoscopy every 10 years starting at age 80. Hepatitis C blood test. Hepatitis B blood test. Sexually transmitted disease (STD) testing. Diabetes screening. This is done by checking your blood sugar (glucose) after you have not eaten for a while (fasting). You may have this done every 1-3 years. Bone density  scan. This is done to screen for osteoporosis. You may have this done starting at age 47. Mammogram. This may be done every 1-2 years. Talk to your health care provider about how often you should have regular mammograms. Talk with your health care provider about your test results, treatment options, and if necessary, the need for more tests. Vaccines  Your health care provider may recommend certain vaccines, such as: Influenza vaccine. This is recommended every year. Tetanus, diphtheria, and acellular pertussis (Tdap, Td) vaccine. You may need a Td booster every 10 years. Zoster vaccine. You may need this after age 35. Pneumococcal 13-valent conjugate (PCV13) vaccine. One dose is recommended after age 34. Pneumococcal polysaccharide (PPSV23) vaccine. One dose is recommended after age 20. Talk to your health care provider about which screenings and vaccines you need and how often you need them. This information is not intended to replace advice given to you by your health care provider. Make sure you discuss any questions you have with your health care provider. Document Released: 01/17/2015 Document Revised: 09/10/2015 Document Reviewed: 10/22/2014 Elsevier Interactive Patient Education  2017 [employer].  Fall Prevention in the Home Falls can cause injuries. They can happen to people of all ages. There are many things you can do to make your home safe and to help prevent falls. What can I do on the outside of my home? Regularly fix the edges of walkways and driveways and fix any cracks. Remove anything that might make you trip as you walk through a door, such as a raised step or threshold. Trim any bushes or trees on the path to your home. Use bright outdoor lighting. Clear any walking paths of anything that might make someone trip, such as rocks or tools. Regularly check to see if handrails are loose or broken. Make sure that both sides of any steps have handrails. Any raised decks and  porches should have guardrails on the edges. Have any leaves, snow, or ice cleared regularly. Use sand or salt on walking paths during winter. Clean up any spills in your garage right away. This includes oil or grease spills. What can I do in the bathroom? Use night lights. Install grab bars by the toilet and in the tub and shower. Do not use towel bars as grab bars. Use non-skid mats or decals in the tub or shower. If you need to sit down in the shower, use a plastic, non-slip stool. Keep the floor dry. Clean up any water that spills on the floor as soon as it happens. Remove soap buildup in the tub or shower regularly. Attach bath mats securely with double-sided non-slip rug tape. Do not have throw rugs and other things on the floor that can make you trip. What can I do in the bedroom? Use night lights. Make sure that you have a light by your bed that is easy to reach. Do not use any sheets or blankets that are too big for your bed. They should not hang down onto the floor. Have a firm chair  that has side arms. You can use this for support while you get dressed. Do not have throw rugs and other things on the floor that can make you trip. What can I do in the kitchen? Clean up any spills right away. Avoid walking on wet floors. Keep items that you use a lot in easy-to-reach places. If you need to reach something above you, use a strong step stool that has a grab bar. Keep electrical cords out of the way. Do not use floor polish or wax that makes floors slippery. If you must use wax, use non-skid floor wax. Do not have throw rugs and other things on the floor that can make you trip. What can I do with my stairs? Do not leave any items on the stairs. Make sure that there are handrails on both sides of the stairs and use them. Fix handrails that are broken or loose. Make sure that handrails are as long as the stairways. Check any carpeting to make sure that it is firmly attached to the  stairs. Fix any carpet that is loose or worn. Avoid having throw rugs at the top or bottom of the stairs. If you do have throw rugs, attach them to the floor with carpet tape. Make sure that you have a light switch at the top of the stairs and the bottom of the stairs. If you do not have them, ask someone to add them for you. What else can I do to help prevent falls? Wear shoes that: Do not have high heels. Have rubber bottoms. Are comfortable and fit you well. Are closed at the toe. Do not wear sandals. If you use a stepladder: Make sure that it is fully opened. Do not climb a closed stepladder. Make sure that both sides of the stepladder are locked into place. Ask someone to hold it for you, if possible. Clearly mark and make sure that you can see: Any grab bars or handrails. First and last steps. Where the edge of each step is. Use tools that help you move around (mobility aids) if they are needed. These include: Canes. Walkers. Scooters. Crutches. Turn on the lights when you go into a dark area. Replace any light bulbs as soon as they burn out. Set up your furniture so you have a clear path. Avoid moving your furniture around. If any of your floors are uneven, fix them. If there are any pets around you, be aware of where they are. Review your medicines with your doctor. Some medicines can make you feel dizzy. This can increase your chance of falling. Ask your doctor what other things that you can do to help prevent falls. This information is not intended to replace advice given to you by your health care provider. Make sure you discuss any questions you have with your health care provider. Document Released: 10/17/2008 Document Revised: 05/29/2015 Document Reviewed: 01/25/2014 Elsevier Interactive Patient Education  2017 [employer].

## 2022-05-23 ENCOUNTER — Other Ambulatory Visit: Payer: Self-pay | Admitting: Student

## 2022-05-23 DIAGNOSIS — J439 Emphysema, unspecified: Secondary | ICD-10-CM

## 2022-05-23 DIAGNOSIS — I10 Essential (primary) hypertension: Secondary | ICD-10-CM

## 2022-06-09 ENCOUNTER — Ambulatory Visit: Payer: 59 | Admitting: Student

## 2022-06-15 NOTE — Progress Notes (Unsigned)
    SUBJECTIVE:   CHIEF COMPLAINT / HPI:   HTN Medication includes HCTZ 12.5 mg daily. Is checking blood pressures at home - inconsistent per pt, sometimes on higher end (systolic 170's), sometimes normal. 162/99 on home cuff today followed by 133/59 in office. Home cuff is one that goes on forearm. Denies any light headedness, SOB other than w/ exertion d/t COPD, no chest pain.   Abdominal pain For past 2-3 months, intermittent epigastric, sharp/sore pain, more so on L side. States she has a family history of spleen issues but not sure what to call it. Multiple family members have had spleens removed at various ages per pt (nephew at 61 and father in older age). She seems very concerned this pain could be related to spleen but also states it could be indigestion. Belches after drinking coffee and eating some foods. No nausea or vomiting, no diarrhea or constipation. Eating like normal.   PERTINENT  PMH / PSH: HTN, COPD, HLD  OBJECTIVE:   BP (!) 133/59   Pulse 80   Ht 5\' 3"  (1.6 m)   Wt 156 lb 3.2 oz (70.9 kg)   SpO2 95%   BMI 27.67 kg/m    General: NAD, pleasant, able to participate in exam Cardiac: RRR, no murmurs. Respiratory: CTAB, normal effort, No wheezes, rales or rhonchi Abdomen: Bowel sounds present, nontender, nondistended, no hepatosplenomegaly, soft Skin: warm and dry Neuro: alert, no obvious focal deficits Psych: Normal affect and mood  ASSESSMENT/PLAN:   Epigastric pain Abdominal exam is very reassuring and pain seems mild and only intermittent. Does have acid reflux symptoms so will trial famotadine. Will also check basic labs to look at liver function, pancrease, and blood counts. Don't think this is related to spleen. I did ask pt to speak with family members to try and find out what disease they were diagnosed with to cause them to have spleens removed.  -CMP -CBC -Lipase -Famotadine 20 mg daily, can increase to BID -pt to return in 2 weeks for  f/u  Hypertension BP initially elevated but came down to 133/59. Will continue HCTZ. Pt plans to get new BP cuff that goes on upper arm and will bring home readings to next apt in 2 weeks.      Dr. Erick Alley, DO [facility]

## 2022-06-16 ENCOUNTER — Ambulatory Visit (INDEPENDENT_AMBULATORY_CARE_PROVIDER_SITE_OTHER): Payer: 59 | Admitting: Student

## 2022-06-16 ENCOUNTER — Other Ambulatory Visit: Payer: Self-pay

## 2022-06-16 ENCOUNTER — Encounter: Payer: Self-pay | Admitting: Student

## 2022-06-16 VITALS — BP 133/59 | HR 80 | Ht 63.0 in | Wt 156.2 lb

## 2022-06-16 DIAGNOSIS — I10 Essential (primary) hypertension: Secondary | ICD-10-CM

## 2022-06-16 DIAGNOSIS — R1013 Epigastric pain: Secondary | ICD-10-CM

## 2022-06-16 DIAGNOSIS — K219 Gastro-esophageal reflux disease without esophagitis: Secondary | ICD-10-CM

## 2022-06-16 MED ORDER — FAMOTIDINE 20 MG PO TABS
20.0000 mg | ORAL_TABLET | Freq: Every day | ORAL | 0 refills | Status: DC
Start: 2022-06-16 — End: 2022-08-10

## 2022-06-16 NOTE — Patient Instructions (Signed)
It was great to see you! Thank you for allowing me to participate in your care!   Our plans for today:  - I prescribe dyou a medication call pepcid for acid reflux. Take once a day. You can increase to twice a day if needed.  - Return in 2 weeks for a follow up visit   We are checking some labs today, I will call you if they are abnormal will send you a MyChart message or a letter if they are normal.  If you do not hear about your labs in the next 2 weeks please let us know.  Take care and seek immediate care sooner if you develop any concerns.   Dr. Erick Alley, DO [facility]

## 2022-06-17 DIAGNOSIS — K219 Gastro-esophageal reflux disease without esophagitis: Secondary | ICD-10-CM | POA: Insufficient documentation

## 2022-06-17 DIAGNOSIS — R1013 Epigastric pain: Secondary | ICD-10-CM | POA: Insufficient documentation

## 2022-06-17 LAB — COMPREHENSIVE METABOLIC PANEL
ALT: 18 IU/L (ref 0–32)
AST: 24 IU/L (ref 0–40)
Albumin/Globulin Ratio: 2.1
Albumin: 4.8 g/dL (ref 3.8–4.8)
Alkaline Phosphatase: 114 IU/L (ref 44–121)
BUN/Creatinine Ratio: 12 (ref 12–28)
BUN: 9 mg/dL (ref 8–27)
Bilirubin Total: 0.4 mg/dL (ref 0.0–1.2)
CO2: 27 mmol/L (ref 20–29)
Calcium: 10.4 mg/dL — ABNORMAL HIGH (ref 8.7–10.3)
Chloride: 94 mmol/L — ABNORMAL LOW (ref 96–106)
Creatinine, Ser: 0.78 mg/dL (ref 0.57–1.00)
Globulin, Total: 2.3 g/dL (ref 1.5–4.5)
Glucose: 97 mg/dL (ref 70–99)
Potassium: 4.3 mmol/L (ref 3.5–5.2)
Sodium: 135 mmol/L (ref 134–144)
Total Protein: 7.1 g/dL (ref 6.0–8.5)
eGFR: 79 mL/min/{1.73_m2} (ref >59)

## 2022-06-17 LAB — CBC WITH DIFFERENTIAL/PLATELET
Basophils Absolute: 0 10*3/uL (ref 0.0–0.2)
Basos: 0 %
EOS (ABSOLUTE): 0 10*3/uL (ref 0.0–0.4)
Eos: 1 %
Hematocrit: 39.9 % (ref 34.0–46.6)
Hemoglobin: 13.2 g/dL (ref 11.1–15.9)
Immature Grans (Abs): 0 10*3/uL (ref 0.0–0.1)
Immature Granulocytes: 0 %
Lymphocytes Absolute: 1.2 10*3/uL (ref 0.7–3.1)
Lymphs: 22 %
MCH: 30.3 pg (ref 26.6–33.0)
MCHC: 33.1 g/dL (ref 31.5–35.7)
MCV: 92 fL (ref 79–97)
Monocytes Absolute: 0.4 10*3/uL (ref 0.1–0.9)
Monocytes: 7 %
Neutrophils Absolute: 3.8 10*3/uL (ref 1.4–7.0)
Neutrophils: 70 %
Platelets: 339 10*3/uL (ref 150–450)
RBC: 4.35 x10E6/uL (ref 3.77–5.28)
RDW: 12.3 % (ref 11.7–15.4)
WBC: 5.5 10*3/uL (ref 3.4–10.8)

## 2022-06-17 LAB — LIPASE: Lipase: 26 U/L (ref 14–85)

## 2022-06-17 NOTE — Assessment & Plan Note (Signed)
>>  ASSESSMENT AND PLAN FOR HYPERTENSION WRITTEN ON 06/17/2022  6:49 AM BY JONES, SARAH, DO  BP initially elevated but came down to 133/59. Will continue HCTZ. Pt plans to get new BP cuff that goes on upper arm and will bring home readings to next apt in 2 weeks.

## 2022-06-17 NOTE — Assessment & Plan Note (Addendum)
Abdominal exam is very reassuring and pain seems mild and only intermittent. Does have acid reflux symptoms so will trial famotadine. Will also check basic labs to look at liver function, pancrease, and blood counts. Don't think this is related to spleen. I did ask pt to speak with family members to try and find out what disease they were diagnosed with to cause them to have spleens removed.  -CMP -CBC -Lipase -Famotadine 20 mg daily, can increase to BID -pt to return in 2 weeks for f/u

## 2022-06-17 NOTE — Assessment & Plan Note (Signed)
BP initially elevated but came down to 133/59. Will continue HCTZ. Pt plans to get new BP cuff that goes on upper arm and will bring home readings to next apt in 2 weeks.

## 2022-07-03 ENCOUNTER — Other Ambulatory Visit: Payer: Self-pay | Admitting: Student

## 2022-07-06 ENCOUNTER — Encounter: Payer: Self-pay | Admitting: Student

## 2022-07-06 ENCOUNTER — Other Ambulatory Visit: Payer: Self-pay

## 2022-07-06 ENCOUNTER — Ambulatory Visit (INDEPENDENT_AMBULATORY_CARE_PROVIDER_SITE_OTHER): Payer: 59 | Admitting: Student

## 2022-07-06 VITALS — BP 123/57 | HR 85 | Ht 63.0 in | Wt 156.8 lb

## 2022-07-06 DIAGNOSIS — K219 Gastro-esophageal reflux disease without esophagitis: Secondary | ICD-10-CM

## 2022-07-06 DIAGNOSIS — I10 Essential (primary) hypertension: Secondary | ICD-10-CM | POA: Diagnosis not present

## 2022-07-06 NOTE — Progress Notes (Unsigned)
    SUBJECTIVE:   CHIEF COMPLAINT / HPI:   Acid reflux Patient seen on 06/16/2022 for epigastric pain and prescribed famotidine 20 mg daily.  CMP, CBC, lipase were all non concerning.  Since starting pepcid she is no longer having the epigastric pain and has been feeling great.   HTN Is taking HCTZ 12.5 mg daily.  Has not been checking blood pressures at home. Has been eating a lot fruits and vegetables, pinto beans cut out meat and walking some and has been feeling very well lately.   PERTINENT  PMH / PSH: HTN  OBJECTIVE:   BP (!) 123/57   Pulse 85   Ht 5\' 3"  (1.6 m)   Wt 156 lb 12.8 oz (71.1 kg)   SpO2 96%   BMI 27.78 kg/m    General: NAD, pleasant, able to participate in exam Cardiac: Well-perfused Respiratory: Breathing comfortably on room air Skin: warm and dry Neuro: alert, no obvious focal deficits Psych: Normal affect and mood  ASSESSMENT/PLAN:   Hypertension Well-controlled.  Diastolic a little low at 57 but patient feels great, no lightheadedness.  Will continue to monitor at future visits but patient advised to return sooner if she begins to feel lightheaded/unwell.  -Continue HCTZ 12.5 mg daily  Acid reflux Great improvement in epigastric pain with famotidine 20 mg daily which we will continue.   Patient to return in ~4 months for BP check  Dr. Erick Alley, DO [facility]

## 2022-07-06 NOTE — Patient Instructions (Addendum)
It was great to see you! Thank you for allowing me to participate in your care!  I recommend that you always bring your medications to each appointment as this makes it easy to ensure you are on the correct medications and helps [place] not miss when refills are needed.  Our plans for today:  - If not eating meat, make sure you are getting plenty of iron and protein with the following: Protein: beans, lentils, eggs, cheese, tofu  Iron: beans, lentils, eggs, tofu, dark leafy greens -Keep taking pepcid for acid reflux  -Return in 3-4 month for blood pressure check or sooner if you don't feel well or especially if you feel light headed.    Take care and seek immediate care sooner if you develop any concerns.   Dr. Erick Alley, DO [facility]

## 2022-07-07 NOTE — Assessment & Plan Note (Signed)
Great improvement in epigastric pain with famotidine 20 mg daily which we will continue.

## 2022-07-07 NOTE — Assessment & Plan Note (Signed)
Well-controlled.  Diastolic a little low at 57 but patient feels great, no lightheadedness.  Will continue to monitor at future visits but patient advised to return sooner if she begins to feel lightheaded/unwell.  -Continue HCTZ 12.5 mg daily

## 2022-07-07 NOTE — Assessment & Plan Note (Signed)
>>  ASSESSMENT AND PLAN FOR HYPERTENSION WRITTEN ON 07/07/2022 10:06 AM BY JOSHUA DOMINO, DO  Well-controlled.  Diastolic a little low at 57 but patient feels great, no lightheadedness.  Will continue to monitor at future visits but patient advised to return sooner if she begins to feel lightheaded/unwell.  -Continue HCTZ 12.5 mg daily

## 2022-08-07 ENCOUNTER — Other Ambulatory Visit: Payer: Self-pay | Admitting: Student

## 2022-08-07 DIAGNOSIS — R1013 Epigastric pain: Secondary | ICD-10-CM

## 2022-08-22 ENCOUNTER — Other Ambulatory Visit: Payer: Self-pay | Admitting: Student

## 2022-09-14 ENCOUNTER — Other Ambulatory Visit: Payer: Self-pay | Admitting: Student

## 2022-09-14 DIAGNOSIS — I10 Essential (primary) hypertension: Secondary | ICD-10-CM

## 2022-10-02 ENCOUNTER — Other Ambulatory Visit: Payer: Self-pay | Admitting: Student

## 2022-10-02 DIAGNOSIS — J439 Emphysema, unspecified: Secondary | ICD-10-CM

## 2022-10-17 ENCOUNTER — Other Ambulatory Visit: Payer: Self-pay | Admitting: Student

## 2022-10-22 IMAGING — CT CT CHEST LUNG CANCER SCREENING LOW DOSE W/O CM
1 series · 10 of 10 positions shown, 13 images · non-contrast
Comparison: 11/03/2018

CLINICAL DATA: Lung cancer screening. 53 pack-year history. Former
asymptomatic smoker.

EXAM:
CT CHEST WITHOUT CONTRAST LOW-DOSE FOR LUNG CANCER SCREENING
TECHNIQUE: Multidetector CT imaging of the chest was performed following the
standard protocol without IV contrast.

[ct lung segmentation data · axial · 0.61mm/px · z∈[-376,-376]mm · 10 of 346 frames shown]
[frame 1/346  mediastinal]
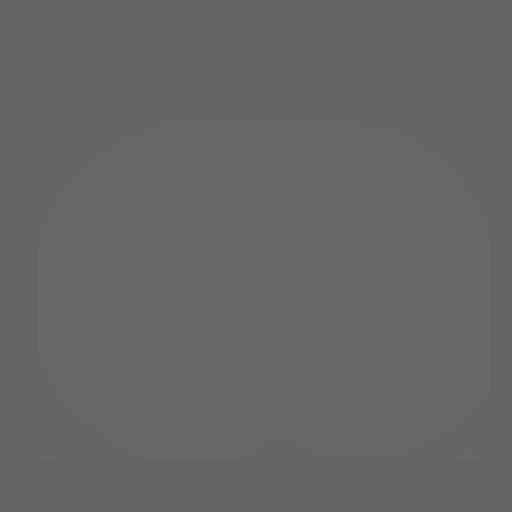
[frame 1/346  lung]
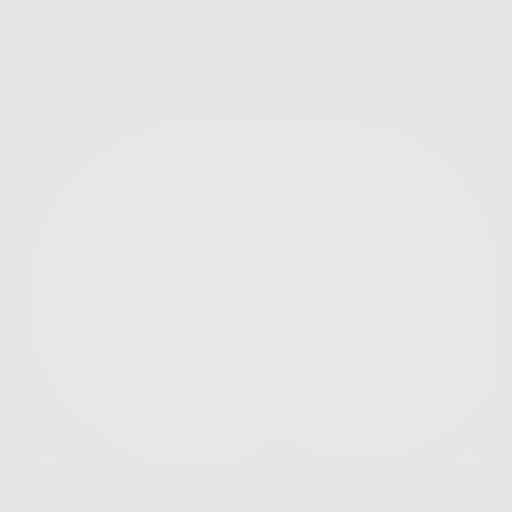
[frame 39/346  lung]
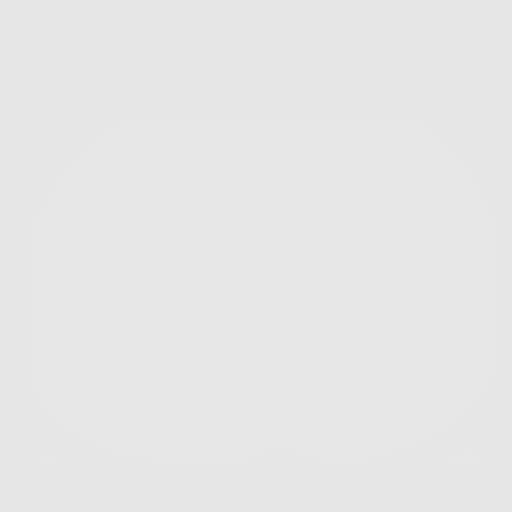
[frame 77/346  lung]
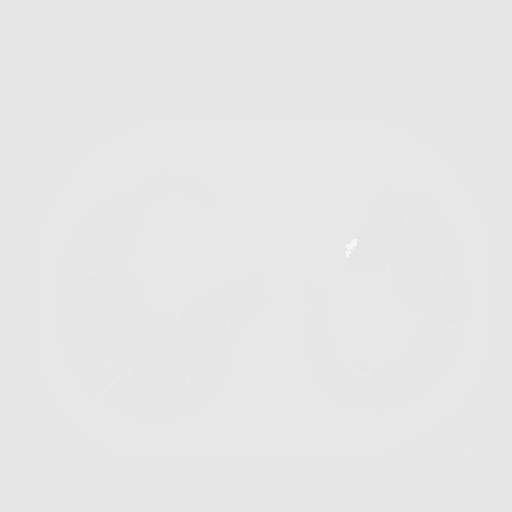
[frame 116/346  lung]
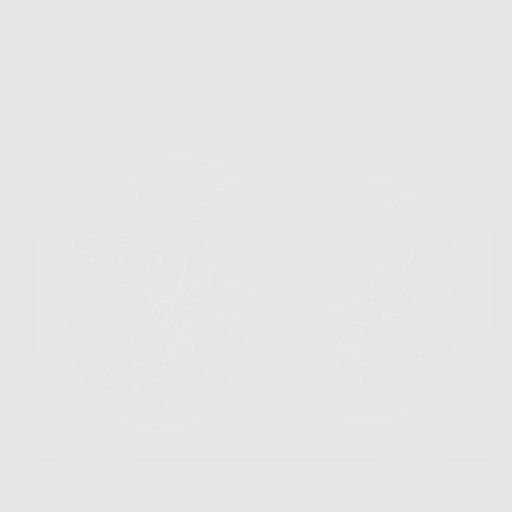
[frame 154/346  mediastinal]
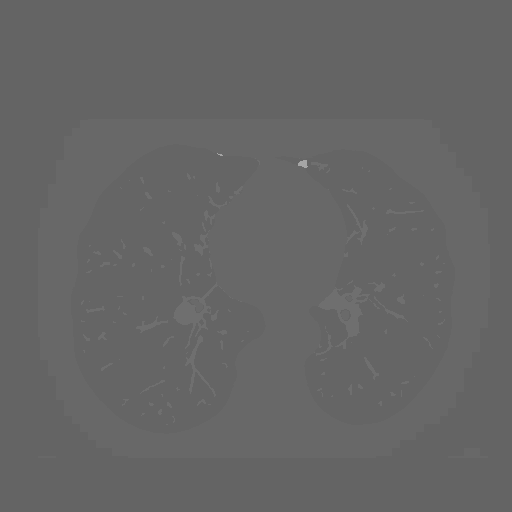
[frame 154/346  lung]
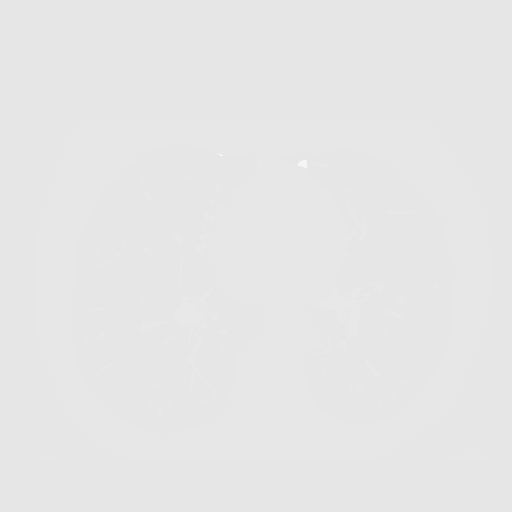
[frame 192/346  lung]
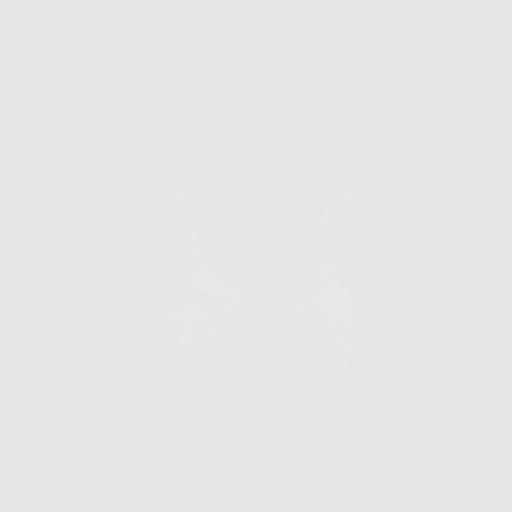
[frame 231/346  lung]
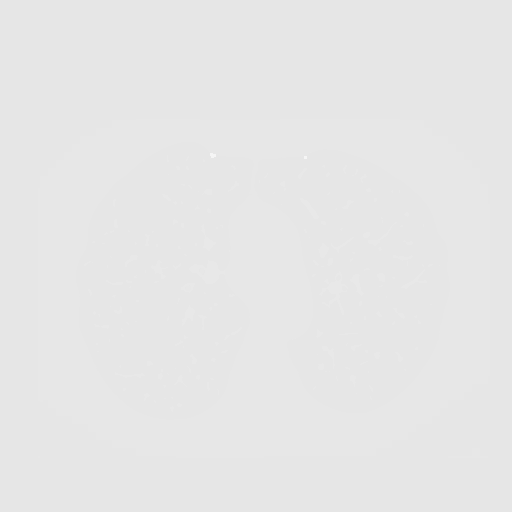
[frame 269/346  lung]
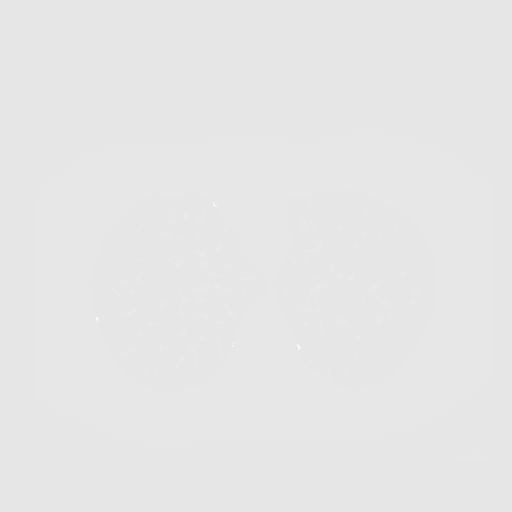
[frame 307/346  mediastinal]
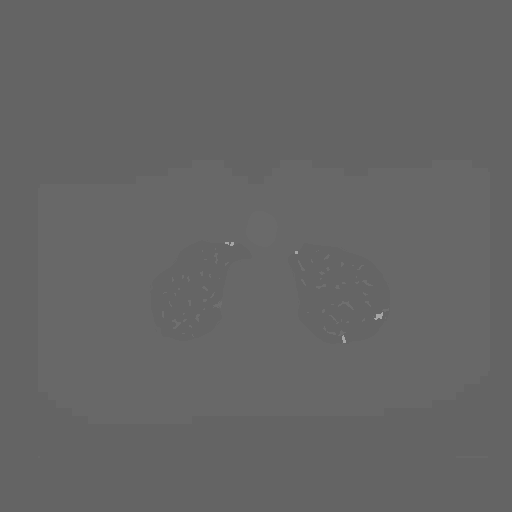
[frame 307/346  lung]
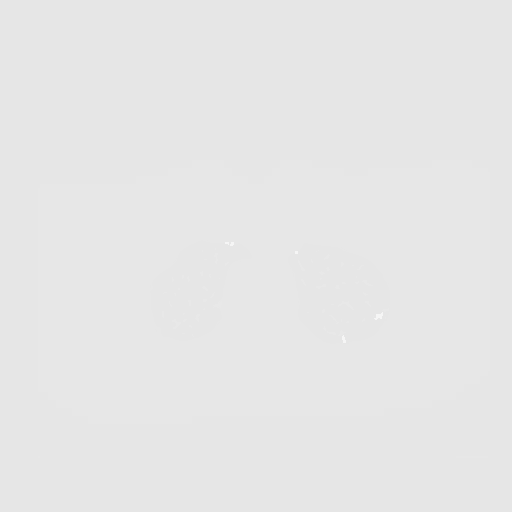
[frame 346/346  lung]
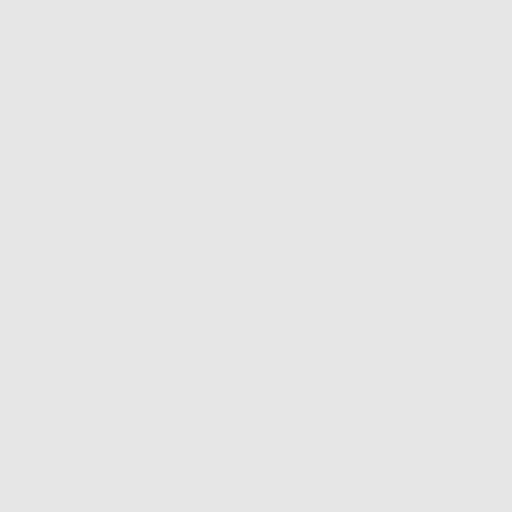

[10 of 10 positions shown; findings below may reference images not displayed]

FINDINGS: Cardiovascular: The heart size appears within normal limits. Aortic
atherosclerosis. Coronary artery atherosclerotic calcifications.

Mediastinum/Nodes: No enlarged supraclavicular, axillary, or
mediastinal adenopathy. Small hiatal hernia noted. Esophagus is
otherwise normal. The trachea appears patent and is midline.
Unremarkable appearance of the thyroid gland.

Lungs/Pleura: Mild centrilobular emphysema. No pleural effusion,
airspace consolidation or atelectasis. Scarring is identified in
both lungs. Small scattered lung nodules are again noted. The
largest is in the left lower lobe with an equivalent diameter of
mm. These are not significantly changed when compared with the
previous exam.

Upper Abdomen: No acute abnormality.

Musculoskeletal: No chest wall mass or suspicious bone lesions
identified. Unchanged superior endplate deformity of L1.
IMPRESSION: 1. Lung-RADS 2, benign appearance or behavior. Continue annual
screening with low-dose chest CT without contrast in 12 months.
2. Coronary artery calcifications.

Aortic Atherosclerosis (U1OG1-J8M.M) and Emphysema (U1OG1-VTW.3).

## 2022-10-30 ENCOUNTER — Ambulatory Visit
Admission: EM | Admit: 2022-10-30 | Discharge: 2022-10-30 | Disposition: A | Discharge status: Home / Self Care | Payer: 59 | Attending: Physician Assistant | Admitting: Physician Assistant

## 2022-10-30 ENCOUNTER — Ambulatory Visit: Payer: 59

## 2022-10-30 ENCOUNTER — Other Ambulatory Visit: Payer: Self-pay

## 2022-10-30 DIAGNOSIS — M5441 Lumbago with sciatica, right side: Secondary | ICD-10-CM | POA: Diagnosis not present

## 2022-10-30 DIAGNOSIS — M545 Low back pain, unspecified: Secondary | ICD-10-CM | POA: Diagnosis not present

## 2022-10-30 MED ORDER — PREDNISONE 20 MG PO TABS
40.0000 mg | ORAL_TABLET | Freq: Every day | ORAL | 0 refills | Status: AC
Start: 2022-10-30 — End: 2022-11-04

## 2022-10-30 MED ORDER — TIZANIDINE HCL 4 MG PO TABS
2.0000 mg | ORAL_TABLET | Freq: Four times a day (QID) | ORAL | 0 refills | Status: DC | PRN
Start: 2022-10-30 — End: 2023-01-15

## 2022-10-30 NOTE — ED Triage Notes (Signed)
"  On Wednesday I was grabbing milk while in grocery store and pulled something in my right lower back, since then pain has been radiating down right buttocks and right leg all the way to foot". No previous injury.

## 2022-10-30 NOTE — ED Provider Notes (Signed)
[facility]    CSN: [id number] Arrival date & time: 10/30/22  0818      History   Chief Complaint Chief Complaint  Patient presents with   Back Pain    HPI Kelly Snyder is a 77 y.o. female.   Patient here today for evaluation of right lower back pain that radiates down her right leg that started after she went to grab milk at the grocery store 2 days ago.  She reports that she has not had any true numbness.  She reports pain feels "deep" and is concerned about her bone.  She denies any previous injury to the area.  She does not report treatment for symptoms other than a topical arthritis treatment.  This was not helpful.  The history is provided by the patient.  Back Pain Associated symptoms: no abdominal pain, no fever and no numbness     Past Medical History:  Diagnosis Date   Arthritis    COPD (chronic obstructive pulmonary disease) (HCC)    Pneumonia     Patient Active Problem List   Diagnosis Date Noted   Acid reflux 06/17/2022   Spinal stenosis of cervical region 01/01/2022   Hypertension 01/01/2022   Subclavian artery stenosis (HCC) 11/16/2018   Aortic atherosclerosis (HCC) 11/04/2018   Osteoporosis 05/02/2017   Hyperlipidemia 01/03/2013   Seborrheic keratosis 02/22/2011   History of tobacco abuse 03/03/2006   COPD (chronic obstructive pulmonary disease) (HCC) 03/03/2006    Past Surgical History:  Procedure Laterality Date   DILATION AND CURETTAGE OF UTERUS  1970    OB History   No obstetric history on file.      Home Medications    Prior to Admission medications   Medication Sig Start Date End Date Taking? Authorizing Provider  predniSONE (DELTASONE) 20 MG tablet Take 2 tablets (40 mg total) by mouth daily with breakfast for 5 days. 10/30/22 11/04/22 Yes Tomi Bamberger, PA-C  tiZANidine (ZANAFLEX) 4 MG tablet Take 0.5-1 tablets (2-4 mg total) by mouth every 6 (six) hours as needed for muscle spasms. 10/30/22  Yes Tomi Bamberger, PA-C  acetaminophen (TYLENOL) 500 MG tablet Take 1,000 mg by mouth every 6 (six) hours as needed for moderate pain (pain).     [provider]  Calcium Carb-Cholecalciferol (CALCIUM 600-D PO) Take 1 tablet by mouth daily.    [provider]  famotidine (PEPCID) 20 MG tablet TAKE 1 TABLET BY MOUTH EVERY DAY 08/10/22   Alicia Amel, MD  hydrochlorothiazide (HYDRODIURIL) 12.5 MG tablet TAKE 1/2 TABLET BY MOUTH DAILY 09/15/22   Erick Alley, DO  Multiple Vitamin (MULTIVITAMIN WITH MINERALS) TABS tablet Take 1 tablet by mouth daily.    [provider]  Tiotropium Bromide Monohydrate (SPIRIVA RESPIMAT) 2.5 MCG/ACT AERS INHALE 2 PUFFS BY MOUTH INTO THE LUNGS DAILY 10/04/22   Erick Alley, DO  VENTOLIN HFA 108 (90 Base) MCG/ACT inhaler TAKE 2 PUFFS BY MOUTH EVERY 6 HOURS AS NEEDED FOR WHEEZE OR SHORTNESS OF BREATH 10/18/22   Erick Alley, DO    Family History Family History  Problem Relation Age of Onset   Diabetes Mother    Heart disease Mother    Heart disease Father    Parkinson's disease Sister    Dementia Sister    Alcohol abuse Son     Social History Social History   Tobacco Use   Smoking status: Former    Current packs/day: 0.50    Average packs/day: 0.5 packs/day for 56.8  years (28.4 ttl pk-yrs)    Types: Cigarettes    Start date: 01/04/1966    Passive exposure: Past   Smokeless tobacco: Never   Tobacco comments:    quit 10/28/18  Vaping Use   Vaping status: Never Used  Substance Use Topics   Alcohol use: Not Currently   Drug use: Never     Allergies   Patient has no known allergies.   Review of Systems Review of Systems  Constitutional:  Negative for chills and fever.  Eyes:  Negative for discharge and redness.  Respiratory:  Negative for shortness of breath.   Gastrointestinal:  Negative for abdominal pain, nausea and vomiting.  Genitourinary:  Negative for difficulty urinating.       No loss of bowel or bladder function   Musculoskeletal:  Positive for arthralgias, back pain and myalgias.  Neurological:  Negative for numbness.     Physical Exam Triage Vital Signs ED Triage Vitals  Encounter Vitals Group     BP 10/30/22 0825 (!) 172/73     Systolic BP Percentile --      Diastolic BP Percentile --      Pulse Rate 10/30/22 0825 87     Resp 10/30/22 0825 20     Temp 10/30/22 0825 98.3 F (36.8 C)     Temp Source 10/30/22 0825 Oral     SpO2 10/30/22 0825 96 %     Weight 10/30/22 0828 156 lb 12 oz (71.1 kg)     Height 10/30/22 0828 5\' 3"  (1.6 m)     Head Circumference --      Peak Flow --      Pain Score 10/30/22 0826 10     Pain Loc --      Pain Education --      Exclude from Growth Chart --    No data found.  Updated Vital Signs BP (!) 168/72 (BP Location: Right Arm)   Pulse 87   Temp 98.3 F (36.8 C) (Oral)   Resp 20   Ht 5\' 3"  (1.6 m)   Wt 156 lb 12 oz (71.1 kg)   SpO2 95%   BMI 27.77 kg/m      Physical Exam Vitals and nursing note reviewed.  Constitutional:      General: She is not in acute distress.    Appearance: Normal appearance. She is not ill-appearing.  HENT:     Head: Normocephalic and atraumatic.  Eyes:     Conjunctiva/sclera: Conjunctivae normal.  Cardiovascular:     Rate and Rhythm: Normal rate.  Pulmonary:     Effort: Pulmonary effort is normal. No respiratory distress.  Musculoskeletal:     Comments: TTP noted to right low back, no TTP to midline thoracic or lumbar spine. Antalgic gait noted.  Neurological:     Mental Status: She is alert.  Psychiatric:        Mood and Affect: Mood normal.        Behavior: Behavior normal.        Thought Content: Thought content normal.      UC Treatments / Results  Labs (all labs ordered are listed, but only abnormal results are displayed) Labs Reviewed - No data to display  EKG   Radiology DG Hip Unilat With Pelvis 2-3 Views Right  Result Date: 10/30/2022 CLINICAL DATA:  Pain in lower back radiating down  right buttocks and right leg. EXAM: DG HIP (WITH OR WITHOUT PELVIS) 2-3V RIGHT COMPARISON:  None Available. FINDINGS: There is  no evidence of hip fracture or dislocation. There is no evidence of arthropathy or other focal bone abnormality. IMPRESSION: No acute abnormality. Electronically Signed   By: Signa Kell M.D.   On: 10/30/2022 10:29    Procedures Procedures (including critical care time)  Medications Ordered in UC Medications - No data to display  Initial Impression / Assessment and Plan / UC Course  I have reviewed the triage vital signs and the nursing notes.  Pertinent labs & imaging results that were available during my care of the patient were reviewed by me and considered in my medical decision making (see chart for details).    X-ray without fracture.  Suspect likely muscular strain with possible sciatic nerve involvement.  Will treat with steroid burst as well as muscle relaxer.  Advised to use muscle laxer with caution as may cause drowsiness and encouraged patient to take half dose initially.  Patient expresses understanding and is agreeable with plan.  Encouraged follow-up if no gradual improvement with any worsening symptoms.  Final Clinical Impressions(s) / UC Diagnoses   Final diagnoses:  Acute right-sided low back pain with right-sided sciatica   Discharge Instructions   None    ED Prescriptions     Medication Sig Dispense Auth. Provider   predniSONE (DELTASONE) 20 MG tablet Take 2 tablets (40 mg total) by mouth daily with breakfast for 5 days. 10 tablet Erma Pinto F, PA-C   tiZANidine (ZANAFLEX) 4 MG tablet Take 0.5-1 tablets (2-4 mg total) by mouth every 6 (six) hours as needed for muscle spasms. 30 tablet Tomi Bamberger, PA-C      PDMP not reviewed this encounter.   Tomi Bamberger, PA-C 10/30/22 1049

## 2022-11-15 DIAGNOSIS — H25813 Combined forms of age-related cataract, bilateral: Secondary | ICD-10-CM | POA: Diagnosis not present

## 2022-11-15 DIAGNOSIS — H5203 Hypermetropia, bilateral: Secondary | ICD-10-CM | POA: Diagnosis not present

## 2022-11-15 DIAGNOSIS — H52203 Unspecified astigmatism, bilateral: Secondary | ICD-10-CM | POA: Diagnosis not present

## 2022-12-11 ENCOUNTER — Other Ambulatory Visit: Payer: Self-pay | Admitting: Student

## 2022-12-11 DIAGNOSIS — I10 Essential (primary) hypertension: Secondary | ICD-10-CM

## 2023-01-03 ENCOUNTER — Ambulatory Visit: Payer: 59

## 2023-01-14 ENCOUNTER — Observation Stay (HOSPITAL_COMMUNITY)
Admission: EM | Admit: 2023-01-14 | Discharge: 2023-01-16 | Disposition: A | Discharge status: Home / Self Care | Payer: 59 | Attending: Family Medicine | Admitting: Family Medicine

## 2023-01-14 ENCOUNTER — Emergency Department (HOSPITAL_COMMUNITY): Payer: 59

## 2023-01-14 ENCOUNTER — Other Ambulatory Visit: Payer: Self-pay

## 2023-01-14 ENCOUNTER — Encounter (HOSPITAL_COMMUNITY): Payer: Self-pay | Admitting: Emergency Medicine

## 2023-01-14 DIAGNOSIS — G4489 Other headache syndrome: Secondary | ICD-10-CM

## 2023-01-14 DIAGNOSIS — G458 Other transient cerebral ischemic attacks and related syndromes: Secondary | ICD-10-CM | POA: Diagnosis not present

## 2023-01-14 DIAGNOSIS — E785 Hyperlipidemia, unspecified: Secondary | ICD-10-CM | POA: Diagnosis present

## 2023-01-14 DIAGNOSIS — I10 Essential (primary) hypertension: Secondary | ICD-10-CM | POA: Diagnosis present

## 2023-01-14 DIAGNOSIS — I700 Atherosclerosis of aorta: Secondary | ICD-10-CM | POA: Diagnosis not present

## 2023-01-14 DIAGNOSIS — I6782 Cerebral ischemia: Secondary | ICD-10-CM | POA: Insufficient documentation

## 2023-01-14 DIAGNOSIS — G936 Cerebral edema: Secondary | ICD-10-CM | POA: Diagnosis not present

## 2023-01-14 DIAGNOSIS — R6889 Other general symptoms and signs: Secondary | ICD-10-CM | POA: Diagnosis not present

## 2023-01-14 DIAGNOSIS — Z743 Need for continuous supervision: Secondary | ICD-10-CM | POA: Diagnosis not present

## 2023-01-14 DIAGNOSIS — Z87891 Personal history of nicotine dependence: Secondary | ICD-10-CM | POA: Insufficient documentation

## 2023-01-14 DIAGNOSIS — Z79899 Other long term (current) drug therapy: Secondary | ICD-10-CM | POA: Insufficient documentation

## 2023-01-14 DIAGNOSIS — J449 Chronic obstructive pulmonary disease, unspecified: Secondary | ICD-10-CM | POA: Insufficient documentation

## 2023-01-14 DIAGNOSIS — R202 Paresthesia of skin: Secondary | ICD-10-CM | POA: Diagnosis not present

## 2023-01-14 DIAGNOSIS — R519 Headache, unspecified: Secondary | ICD-10-CM | POA: Insufficient documentation

## 2023-01-14 DIAGNOSIS — I499 Cardiac arrhythmia, unspecified: Secondary | ICD-10-CM | POA: Diagnosis not present

## 2023-01-14 DIAGNOSIS — I672 Cerebral atherosclerosis: Secondary | ICD-10-CM | POA: Insufficient documentation

## 2023-01-14 DIAGNOSIS — R200 Anesthesia of skin: Principal | ICD-10-CM

## 2023-01-14 DIAGNOSIS — R29818 Other symptoms and signs involving the nervous system: Secondary | ICD-10-CM | POA: Diagnosis present

## 2023-01-14 DIAGNOSIS — I6523 Occlusion and stenosis of bilateral carotid arteries: Secondary | ICD-10-CM | POA: Diagnosis not present

## 2023-01-14 LAB — BASIC METABOLIC PANEL
Anion gap: 12 (ref 5–15)
BUN: 9 mg/dL (ref 8–23)
CO2: 28 mmol/L (ref 22–32)
Calcium: 9 mg/dL (ref 8.9–10.3)
Chloride: 96 mmol/L — ABNORMAL LOW (ref 98–111)
Creatinine, Ser: 0.74 mg/dL (ref 0.44–1.00)
GFR, Estimated: >60 mL/min (ref >60)
Glucose, Bld: 114 mg/dL — ABNORMAL HIGH (ref 70–99)
Potassium: 3.5 mmol/L (ref 3.5–5.1)
Sodium: 136 mmol/L (ref 135–145)

## 2023-01-14 LAB — URINALYSIS, ROUTINE W REFLEX MICROSCOPIC
Bilirubin Urine: NEGATIVE
Glucose, UA: NEGATIVE mg/dL
Hgb urine dipstick: NEGATIVE
Ketones, ur: 5 mg/dL — AB
Leukocytes,Ua: NEGATIVE
Nitrite: NEGATIVE
Protein, ur: NEGATIVE mg/dL
Specific Gravity, Urine: 1.013 (ref 1.005–1.030)
pH: 6 (ref 5.0–8.0)

## 2023-01-14 LAB — APTT: aPTT: 25 s (ref 24–36)

## 2023-01-14 LAB — CBC
HCT: 37.4 % (ref 36.0–46.0)
Hemoglobin: 12.4 g/dL (ref 12.0–15.0)
MCH: 30.7 pg (ref 26.0–34.0)
MCHC: 33.2 g/dL (ref 30.0–36.0)
MCV: 92.6 fL (ref 80.0–100.0)
Platelets: 328 10*3/uL (ref 150–400)
RBC: 4.04 MIL/uL (ref 3.87–5.11)
RDW: 12.7 % (ref 11.5–15.5)
WBC: 7 10*3/uL (ref 4.0–10.5)
nRBC: 0 % (ref 0.0–0.2)

## 2023-01-14 LAB — DIFFERENTIAL
Abs Immature Granulocytes: 0.03 10*3/uL (ref 0.00–0.07)
Basophils Absolute: 0.1 10*3/uL (ref 0.0–0.1)
Basophils Relative: 1 %
Eosinophils Absolute: 0.1 10*3/uL (ref 0.0–0.5)
Eosinophils Relative: 2 %
Immature Granulocytes: 0 %
Lymphocytes Relative: 15 %
Lymphs Abs: 1.2 10*3/uL (ref 0.7–4.0)
Monocytes Absolute: 0.5 10*3/uL (ref 0.1–1.0)
Monocytes Relative: 6 %
Neutro Abs: 6.5 10*3/uL (ref 1.7–7.7)
Neutrophils Relative %: 76 %

## 2023-01-14 LAB — PROTIME-INR
INR: 0.9 (ref 0.8–1.2)
Prothrombin Time: 12.5 s (ref 11.4–15.2)

## 2023-01-14 LAB — ETHANOL: Alcohol, Ethyl (B): <10 mg/dL (ref <10)

## 2023-01-14 MED ORDER — LACTATED RINGERS IV SOLN: INTRAVENOUS | Status: DC

## 2023-01-14 MED ORDER — ASPIRIN 325 MG PO TBEC
325.0000 mg | DELAYED_RELEASE_TABLET | Freq: Once | ORAL | Status: AC
  Administered 2023-01-15: 325 mg via ORAL
  Filled 2023-01-14: qty 1

## 2023-01-14 MED ORDER — IOHEXOL 350 MG/ML SOLN
75.0000 mL | Freq: Once | INTRAVENOUS | Status: AC | PRN
  Administered 2023-01-14: 75 mL via INTRAVENOUS

## 2023-01-14 NOTE — Plan of Care (Signed)
 Discussed with Dr. Ismael, 78 year old woman who is presenting with a second episode of left-sided numbness which was also associated with a visual aura (typical for her to have visual aura without headache) as well as mild headache.  Head CT was reassuring.  She is hypertensive on ED arrival.  On review of notes she had a very similar episode in December 2023 approximately 1 year ago.  At that time MRI brain and C-spine were negative and she was planned for close outpatient follow-up with neurology January 8.  However I am unable to access any neurology notes, unclear if she did have neurology follow-up  The stereotyped nature of the events as described to me by Dr. Ismael and in comparison with notes from the episode in December is reassuring, and complex migraine is a strong possibility.  However given her age and comorbidities would obtain at least CTA head and neck and MRI brain.  If these studies are reassuring, shared decision making with patient regarding admission for full workup (echocardiogram) versus close outpatient follow-up with her PCP for this and outpatient referral to neurology  Lola Jernigan MD-PhD [facility] [phone] Available 7 PM to 7 AM, outside of these hours please call Neurologist on call as listed on Amion.  9 minutes spent in care of this patient, majority in discussion with Dr. Ismael  These are curbside recommendations based upon the information readily available in the chart on brief review as well as history and examination information provided to me by requesting provider and do not replace a full detailed consult

## 2023-01-14 NOTE — ED Provider Notes (Addendum)
 [facility] Provider Note   CSN: [id number] Arrival date & time: 01/14/23  2030     History  Chief Complaint  Patient presents with   Numbness    Pt states that she started having having left hand numbness that started approx 1 hour PTA. Pt states that the numbness went up her arm into her face and her tongue went numb, pt states that it was felt like half her tongue was numb. Pt states that she had a hot flash afterward.     Kelly Snyder is a 78 y.o. female.  HPI Patient reports that she was in her normal state of health watching television this evening.  She estimates at around 7 30-8, she felt like her left hand went numb.  This numbness then went up her arm and then she felt numbness on the side of her face on the left.  She did not develop other associated symptoms immediately.  She did not have any weakness that she recognized or any visual change or loss.  EMS was called.  Patient was transported.  Symptoms have since resolved and she does not feel numbness anymore.  She reports however now she has a vague moderate headache on the left forehead.  She also reports she is getting scintillating lights sparklers in her vision on the left.  Patient reports that she has a prior diagnosis of ocular migraines but never has a headache or other symptoms in association with them.  Patient reports her home is heated by a wood stove.  She reports that her grandson and his girlfriend live with her.  She denies that they have had symptoms of illness.  They have not had headache or felt unwell.    Home Medications Prior to Admission medications   Medication Sig Start Date End Date Taking? Authorizing Provider  acetaminophen  (TYLENOL ) 500 MG tablet Take 1,000 mg by mouth every 6 (six) hours as needed for moderate pain (pain).     [provider]  Calcium  Carb-Cholecalciferol (CALCIUM  600-D PO) Take 1 tablet by mouth daily.    [provider]  famotidine  (PEPCID ) 20 MG tablet TAKE 1 TABLET BY MOUTH EVERY DAY 08/10/22   Marlee Lynwood NOVAK, MD  hydrochlorothiazide  (HYDRODIURIL ) 12.5 MG tablet TAKE 1/2 TABLET BY MOUTH DAILY 12/13/22   Joshua Domino, DO  Multiple Vitamin (MULTIVITAMIN WITH MINERALS) TABS tablet Take 1 tablet by mouth daily.    [provider]  Tiotropium Bromide  Monohydrate (SPIRIVA  RESPIMAT) 2.5 MCG/ACT AERS INHALE 2 PUFFS BY MOUTH INTO THE LUNGS DAILY 10/04/22   Joshua Domino, DO  tiZANidine  (ZANAFLEX ) 4 MG tablet Take 0.5-1 tablets (2-4 mg total) by mouth every 6 (six) hours as needed for muscle spasms. 10/30/22   Billy Asberry FALCON, PA-C  VENTOLIN  HFA 108 (90 Base) MCG/ACT inhaler TAKE 2 PUFFS BY MOUTH EVERY 6 HOURS AS NEEDED FOR WHEEZE OR SHORTNESS OF BREATH 10/18/22   Joshua Domino, DO      Allergies    Patient has no known allergies.    Review of Systems   Review of Systems  Physical Exam Updated Vital Signs BP (!) 176/75   Pulse 96   Temp 98.3 F (36.8 C) (Oral)   Resp 18   Ht 5' 3 (1.6 m)   Wt 68 kg   SpO2 96%   BMI 26.57 kg/m  Physical Exam Constitutional:      Comments: Alert nontoxic clear mental status no respiratory distress  HENT:     Head: Normocephalic and atraumatic.     Mouth/Throat:     Mouth: Mucous membranes are moist.     Pharynx: Oropharynx is clear.  Eyes:     Extraocular Movements: Extraocular movements intact.     Pupils: Pupils are equal, round, and reactive to light.  Cardiovascular:     Rate and Rhythm: Normal rate and regular rhythm.  Pulmonary:     Effort: Pulmonary effort is normal.     Breath sounds: Normal breath sounds.  Abdominal:     General: There is no distension.     Palpations: Abdomen is soft.     Tenderness: There is no abdominal tenderness. There is no guarding.  Musculoskeletal:        General: No swelling. Normal range of motion.     Right lower leg: No edema.     Left lower leg: No edema.  Skin:    General: Skin is warm and dry.   Neurological:     General: No focal deficit present.     Mental Status: She is oriented to person, place, and time.     Motor: No weakness.     Coordination: Coordination normal.     Comments: Mental status clear.  Speech clear.  Content normal.  Cranial nerves II through XII intact.  Finger-nose exam intact bilaterally.  Motor strength 5\5 upper and lower extremities with some limitation right lower extremity due to pre-existing sciatica.  Patient endorses slight difference to light touch on the left side of the face.  Otherwise symmetric sensation to light touch.     ED Results / Procedures / Treatments   Labs (all labs ordered are listed, but only abnormal results are displayed) Labs Reviewed  BASIC METABOLIC PANEL - Abnormal; Notable for the following components:      Result Value   Chloride 96 (*)    Glucose, Bld 114 (*)    All other components within normal limits  URINALYSIS, ROUTINE W REFLEX MICROSCOPIC - Abnormal; Notable for the following components:   Ketones, ur 5 (*)    All other components within normal limits  CBC  PROTIME-INR  APTT  DIFFERENTIAL  ETHANOL  COOXEMETRY PANEL    EKG EKG Interpretation Date/Time:  Friday January 14 2023 20:38:30 EST Ventricular Rate:  92 PR Interval:  138 QRS Duration:  145 QT Interval:  359 QTC Calculation: 445 R Axis:   88  Text Interpretation: Sinus rhythm Consider left atrial enlargement IVCD, consider atypical RBBB no sig change from previous Confirmed by Armenta Canning [phone]) on 01/14/2023 9:32:53 PM  Radiology CT HEAD WO CONTRAST Result Date: 01/14/2023 CLINICAL DATA:  Headache, facial numbness EXAM: CT HEAD WITHOUT CONTRAST TECHNIQUE: Contiguous axial images were obtained from the base of the skull through the vertex without intravenous contrast. RADIATION DOSE REDUCTION: This exam was performed according to the departmental dose-optimization program which includes automated exposure control, adjustment of the mA  and/or kV according to patient size and/or use of iterative reconstruction technique. COMPARISON:  MRI brain dated 12/28/2021 FINDINGS: Brain: No evidence of acute infarction, hemorrhage, hydrocephalus, extra-axial collection or mass lesion/mass effect. Mild subcortical white matter and periventricular small vessel ischemic changes. Vascular: Mild intracranial atherosclerosis. Skull: Normal. Negative for fracture or focal lesion. Sinuses/Orbits: The visualized paranasal sinuses are essentially clear. The mastoid air cells are unopacified. Other: None. IMPRESSION: No acute intracranial abnormality. Mild small vessel ischemic changes. Electronically Signed   By: Pinkie Pebbles M.D.   On: 01/14/2023 21:44  Procedures Procedures    Medications Ordered in ED Medications  lactated ringers  infusion (has no administration in time range)  iohexol  (OMNIPAQUE ) 350 MG/ML injection 75 mL (has no administration in time range)    ED Course/ Medical Decision Making/ A&P                                 Medical Decision Making Amount and/or Complexity of Data Reviewed Labs: ordered. Radiology: ordered.  Risk OTC drugs. Prescription drug management. Decision regarding hospitalization.  Patient presents as outlined.  She had initial onset of left hand numbness that proceeded to include her arm and left side of the face.  At that time, she did not perceive specific weakness.  Differential diagnosis includes complex migraine\TIA\CVA\neuropathy.  Will proceed with stroke evaluation.  Currently patient does not have positive symptoms and does not meet code stroke criteria.  Ethanol negative urinalysis negative basic metabolic panel within normal limits CBC normal  CT head interpreted by radiology small vessel ischemic disease no acute abnormality identified.  Reviewed with neurology, Dr. Laurel.  Recommends CT angiogram and MRI.  May also benefit from complete stroke workup with echocardiograms.   Initiation of aspirin . Differential includes complex migraine however in light of patient's age also has stroke risk.  Consult: Family practice resident for admission.    Final Clinical Impression(s) / ED Diagnoses Final diagnoses:  Left sided numbness  Acute nonintractable headache, unspecified headache type    Rx / DC Orders ED Discharge Orders     None         Armenta Canning, MD 01/14/23 [id number]    Armenta Canning, MD 01/15/23 [phone]

## 2023-01-14 NOTE — ED Notes (Signed)
Patient is in CT.

## 2023-01-14 NOTE — ED Notes (Signed)
 Patient is ambulatory to restroom independently.

## 2023-01-14 NOTE — ED Triage Notes (Signed)
 Pt BIB [facility] for numbness to left  and hand side of the face for 15 mins, states she has a headache behind right eye . Ambulatory, A&o x4 . HX of COPD , 96% on RA 176/80, 104 pulse

## 2023-01-15 ENCOUNTER — Observation Stay (HOSPITAL_COMMUNITY): Payer: 59

## 2023-01-15 ENCOUNTER — Observation Stay (HOSPITAL_BASED_OUTPATIENT_CLINIC_OR_DEPARTMENT_OTHER): Payer: 59

## 2023-01-15 DIAGNOSIS — G459 Transient cerebral ischemic attack, unspecified: Secondary | ICD-10-CM

## 2023-01-15 DIAGNOSIS — R200 Anesthesia of skin: Secondary | ICD-10-CM | POA: Diagnosis not present

## 2023-01-15 DIAGNOSIS — R29818 Other symptoms and signs involving the nervous system: Secondary | ICD-10-CM | POA: Diagnosis not present

## 2023-01-15 DIAGNOSIS — R202 Paresthesia of skin: Secondary | ICD-10-CM

## 2023-01-15 DIAGNOSIS — G4489 Other headache syndrome: Secondary | ICD-10-CM | POA: Diagnosis not present

## 2023-01-15 DIAGNOSIS — G43109 Migraine with aura, not intractable, without status migrainosus: Secondary | ICD-10-CM | POA: Insufficient documentation

## 2023-01-15 DIAGNOSIS — I10 Essential (primary) hypertension: Secondary | ICD-10-CM | POA: Diagnosis not present

## 2023-01-15 LAB — ECHOCARDIOGRAM COMPLETE
AR max vel: 1.95 cm2
AV Area VTI: 1.83 cm2
AV Area mean vel: 1.71 cm2
AV Mean grad: 5 mm[Hg]
AV Peak grad: 8.8 mm[Hg]
Ao pk vel: 1.48 m/s
Area-P 1/2: 2.62 cm2
Calc EF: 59.6 %
Height: 63 in
MV VTI: 1.73 cm2
S' Lateral: 2.9 cm
Single Plane A2C EF: 60.4 %
Single Plane A4C EF: 60.5 %
Weight: 2400 [oz_av]

## 2023-01-15 LAB — HEMOGLOBIN A1C
Hgb A1c MFr Bld: 5.2 % (ref 4.8–5.6)
Mean Plasma Glucose: 102.54 mg/dL

## 2023-01-15 LAB — COOXEMETRY PANEL
Carboxyhemoglobin: 2.6 % — ABNORMAL HIGH (ref 0.5–1.5)
Methemoglobin: 0.8 % (ref 0.0–1.5)
O2 Saturation: 98.2 %
Total hemoglobin: 12.5 g/dL (ref 12.0–16.0)

## 2023-01-15 LAB — LIPID PANEL
Cholesterol: 198 mg/dL (ref 0–200)
HDL: 74 mg/dL (ref >40)
LDL Cholesterol: 117 mg/dL — ABNORMAL HIGH (ref 0–99)
Total CHOL/HDL Ratio: 2.7 {ratio}
Triglycerides: 37 mg/dL (ref <150)
VLDL: 7 mg/dL (ref 0–40)

## 2023-01-15 MED ORDER — HYDROCHLOROTHIAZIDE 12.5 MG PO TABS: 6.2500 mg | ORAL_TABLET | Freq: Every day | ORAL | Status: DC

## 2023-01-15 MED ORDER — ASPIRIN 81 MG PO CHEW
81.0000 mg | CHEWABLE_TABLET | Freq: Every day | ORAL | Status: DC
  Administered 2023-01-16: 81 mg via ORAL
  Filled 2023-01-15: qty 1

## 2023-01-15 MED ORDER — UMECLIDINIUM BROMIDE 62.5 MCG/ACT IN AEPB
1.0000 | INHALATION_SPRAY | Freq: Every day | RESPIRATORY_TRACT | Status: DC
  Administered 2023-01-16: 1 via RESPIRATORY_TRACT
  Filled 2023-01-15 (×2): qty 7

## 2023-01-15 MED ORDER — ROSUVASTATIN CALCIUM 5 MG PO TABS
5.0000 mg | ORAL_TABLET | Freq: Every day | ORAL | Status: DC
  Administered 2023-01-15 – 2023-01-16 (×2): 5 mg via ORAL
  Filled 2023-01-15 (×2): qty 1

## 2023-01-15 MED ORDER — ENOXAPARIN SODIUM 40 MG/0.4ML IJ SOSY
40.0000 mg | PREFILLED_SYRINGE | Freq: Every day | INTRAMUSCULAR | Status: DC
  Administered 2023-01-15 – 2023-01-16 (×2): 40 mg via SUBCUTANEOUS
  Filled 2023-01-15 (×2): qty 0.4

## 2023-01-15 MED ORDER — ACETAMINOPHEN 650 MG RE SUPP: 650.0000 mg | Freq: Four times a day (QID) | RECTAL | Status: DC | PRN

## 2023-01-15 MED ORDER — ACETAMINOPHEN 325 MG PO TABS: 650.0000 mg | ORAL_TABLET | Freq: Four times a day (QID) | ORAL | Status: DC | PRN

## 2023-01-15 MED ORDER — STROKE: EARLY STAGES OF RECOVERY BOOK
Freq: Once | Status: AC
  Filled 2023-01-15: qty 1

## 2023-01-15 MED ORDER — PROPRANOLOL HCL ER 60 MG PO CP24
60.0000 mg | ORAL_CAPSULE | Freq: Every day | ORAL | Status: DC
  Filled 2023-01-15 (×2): qty 1

## 2023-01-15 MED ORDER — LORAZEPAM 2 MG/ML IJ SOLN
1.0000 mg | INTRAMUSCULAR | Status: DC | PRN
Start: 2023-01-15 — End: 2023-01-16
  Administered 2023-01-15: 1 mg via INTRAVENOUS
  Filled 2023-01-15: qty 1

## 2023-01-15 MED ORDER — ALBUTEROL SULFATE (2.5 MG/3ML) 0.083% IN NEBU: 2.5000 mg | INHALATION_SOLUTION | Freq: Four times a day (QID) | RESPIRATORY_TRACT | Status: DC | PRN

## 2023-01-15 NOTE — Assessment & Plan Note (Addendum)
 Left UE and facial numbness/tingling now completely resolved.  Visual changes consistent with ocular migraine also completely resolved.  Admitted for observation and to r/o acute intracranial pathology with the CTA head and MRI head per neurology.  CTA with no acute findings. S/p 1 dose ASA 325 mg.  Has wood stove at home, co-ox ordered in ED - Admit to MedSurg, attending Dr. McDiarmid  - Neurology following, appreciate recommendations - f/u MR brain - neuro checks q4h  - Recommend out pt f/u with vascular for further evaluation of left subclavian artery stenosis - Plan to discuss starting a statin (on chart review, has declined in the past) - f/u lipid panel  - f/u co-ox -Tylenol  as needed

## 2023-01-15 NOTE — Plan of Care (Signed)
  Problem: Health Behavior/Discharge Planning: Goal: Ability to manage health-related needs will improve Outcome: Progressing   Problem: Clinical Measurements: Goal: Ability to maintain clinical measurements within normal limits will improve Outcome: Progressing   Problem: Elimination: Goal: Will not experience complications related to bowel motility Outcome: Progressing   Problem: Pain Management: Goal: General experience of comfort will improve Outcome: Progressing

## 2023-01-15 NOTE — Assessment & Plan Note (Addendum)
 BP elevated most recently 170/60s and currently asymptomatic. Takes HCTZ 12.5 mg half tablet daily.  BP has been on lower end at office visits in the past -CTM -Permissive hypertension while awaiting MRI - Need to titrate BP meds to R arm (L arm with subclavian artery stenosis)

## 2023-01-15 NOTE — Plan of Care (Addendum)
 FMTS Interim Progress Note  S: Patient sleeping, did not wake  O: BP (!) 154/64 (BP Location: Left Arm)   Pulse 87   Temp 98 F (36.7 C) (Oral)   Resp 17   Ht 5' 3 (1.6 m)   Wt 68 kg   SpO2 94%   BMI 26.57 kg/m   Sleeping soundly, breathing comfortably on room air  A/P: Numbness and tingling of LUE and face followed by visual aura with headache, resolved  Co-ox returned, carboxyhemoglobin only mildly elevated to 2.6 Undergoing stroke workup per neurology in setting of risk factors (smoking, age, HLD not on statin) -f/u brain MRI -f/u echo  -f/u risk stratification labs (A1c, lipid panel) -agreed to start Crestor  5 mg daily (has been hesitant to start statin in the past due to possibility of side effects) -Continue permissive hypertension until MRI complete   Joshua Domino, DO 01/15/2023, 5:31 AM PGY-3, [facility] pager [phone]

## 2023-01-15 NOTE — Progress Notes (Signed)
Patient back to the unit.

## 2023-01-15 NOTE — Assessment & Plan Note (Signed)
 BP mildly elevated over admission; patient has remained asymptomatic. Takes HCTZ 12.5 mg half tablet daily.  BP has been on lower end at office visits in the past. -Continue HCTZ 12.5 mg (half tablet) daily and recommend outpatient follow-up -Need to titrate BP meds to R arm (L arm with subclavian artery stenosis)

## 2023-01-15 NOTE — Discharge Instructions (Addendum)
 Dear Reena JONELLE Cocking,  Thank you for letting us  participate in your care. You were hospitalized for  Numbness and tingling.  Imaging of your head showed no new concerning abnormalities, and an ultrasound of your heart showed good heart function without concerning abnormalities.  You were started on a medication to help lower your cholesterol which may help some of these events in the future.  You were also started on a medication called propranolol  to help headaches.  Be sure to follow-up with your primary care doctor as soon as possible for your hospital follow-up appointment.  POST-HOSPITAL & CARE INSTRUCTIONS See the medication section of your discharge paperwork and take medications as prescribed Go to your follow up appointments (listed below)   DOCTOR'S APPOINTMENT   Future Appointments  Date Time Provider Department Center  01/26/2023  9:40 AM [facility]     Take care and be well!  Family Medicine Teaching Service Inpatient Team [place]  [facility]  [address] [place], [place] [address] [phone]

## 2023-01-15 NOTE — Subjective & Objective (Signed)
 I have seen and examined this patient, and reviewed their chart. I have discussed this patient with the resident. I agree with the resident's findings, assessment and care plan.  Principal Problem:   Transient neurological symptoms            - migraine with aura and headache Vs TIA                        - Kelly Snyder had a similar transient neurological presentation on 12/28/2021.                         - Patient has history of ocular migraine with scintillating scotoma usually without associated headache                                     - frequency is around once every couple of weeks to several in a day.    Kelly Snyder may benefit from a migraine prophylactic like Propranolol  or Topamax, at least for her frequent ocular migraines.    Await Neurology's further recommendations before consideration of DC home.

## 2023-01-15 NOTE — Consult Note (Signed)
 NEUROLOGY CONSULT NOTE   Date of service: January 15, 2023 Patient Name: Kelly Snyder MRN:  [id number] DOB:  03-26-1945 Chief Complaint: Left sided numbness Requesting Provider: Armenta Canning, MD    History of Present Illness  Kelly Snyder is a 78 y.o. woman with past medical history significant for hypertension, untreated hyperlipidemia, 53-pack-year smoking history (quit 10/2022), COPD  She reports she was in her usual state of health when at 7:30 PM she began to have left arm numbness that subsequently spread into her face and tongue.  This lasted less than 30 minutes but resolved.  Subsequently while in the ED she had her typical ocular migraine but also had a associated right frontal headache which is atypical for her (generally only has visual symptoms without headache)  On chart review she had a similar episode 12/28/2021.  She reports that the numbness was quite similar to this episode but does not recall having the ocular migraine at that time  LKW:  Modified rankin score: 0- Still driving IV Thrombolysis: No, symptoms resolved  EVT: No, no LVO   NIHSS components Score: Comment  1a Level of Conscious 0[x]  1[]  2[]  3[]      1b LOC Questions 0[x]  1[]  2[]       1c LOC Commands 0[x]  1[]  2[]       2 Best Gaze 0[x]  1[]  2[]       3 Visual 0[x]  1[]  2[]  3[]      4 Facial Palsy 0[x]  1[]  2[]  3[]      5a Motor Arm - left 0[x]  1[]  2[]  3[]  4[]  UN[]    5b Motor Arm - Right 0[x]  1[]  2[]  3[]  4[]  UN[]    6a Motor Leg - Left 0[x]  1[]  2[]  3[]  4[]  UN[]    6b Motor Leg - Right 0[x]  1[]  2[]  3[]  4[]  UN[]    7 Limb Ataxia 0[x]  1[]  2[]  3[]  UN[]     8 Sensory 0[x]  1[]  2[]  UN[]      9 Best Language 0[x]  1[]  2[]  3[]      10 Dysarthria 0[x]  1[]  2[]  UN[]      11 Extinct. and Inattention 0[x]  1[]  2[]       TOTAL: 0       ROS  Comprehensive ROS performed and pertinent positives documented in HPI   Past History   Past Medical History:  Diagnosis Date   Arthritis    COPD (chronic obstructive  pulmonary disease) (HCC)    Pneumonia     Past Surgical History:  Procedure Laterality Date   DILATION AND CURETTAGE OF UTERUS  1970    Family History: Family History  Problem Relation Age of Onset   Diabetes Mother    Heart disease Mother    Heart disease Father    Parkinson's disease Sister    Dementia Sister    Alcohol abuse Son     Social History  reports that she has quit smoking. Her smoking use included cigarettes. She started smoking about 57 years ago. She has a 28.5 pack-year smoking history. She has been exposed to tobacco smoke. She has never used smokeless tobacco. She reports that she does not currently use alcohol. She reports that she does not use drugs.  No Known Allergies  Medications   Current Facility-Administered Medications:    aspirin  EC tablet 325 mg, 325 mg, Oral, Once, Pfeiffer, Marcy, MD   lactated ringers  infusion, , Intravenous, Continuous, Pfeiffer, Marcy, MD  Current Outpatient Medications:    acetaminophen  (TYLENOL ) 500 MG tablet, Take 1,000 mg by mouth every 6 (six) hours as  needed for moderate pain (pain). , Disp: , Rfl:    Calcium  Carb-Cholecalciferol (CALCIUM  600-D PO), Take 1 tablet by mouth daily., Disp: , Rfl:    famotidine  (PEPCID ) 20 MG tablet, TAKE 1 TABLET BY MOUTH EVERY DAY, Disp: 90 tablet, Rfl: 1   hydrochlorothiazide  (HYDRODIURIL ) 12.5 MG tablet, TAKE 1/2 TABLET BY MOUTH DAILY, Disp: 45 tablet, Rfl: 0   Multiple Vitamin (MULTIVITAMIN WITH MINERALS) TABS tablet, Take 1 tablet by mouth daily., Disp: , Rfl:    Tiotropium Bromide  Monohydrate (SPIRIVA  RESPIMAT) 2.5 MCG/ACT AERS, INHALE 2 PUFFS BY MOUTH INTO THE LUNGS DAILY, Disp: 4 g, Rfl: 3   tiZANidine  (ZANAFLEX ) 4 MG tablet, Take 0.5-1 tablets (2-4 mg total) by mouth every 6 (six) hours as needed for muscle spasms., Disp: 30 tablet, Rfl: 0   VENTOLIN  HFA 108 (90 Base) MCG/ACT inhaler, TAKE 2 PUFFS BY MOUTH EVERY 6 HOURS AS NEEDED FOR WHEEZE OR SHORTNESS OF BREATH, Disp: 18 each, Rfl:  1  Vitals   Vitals:   Feb 05, 2023 2040 02/05/2023 2041 2023-02-05 2045 02/05/2023 2100  BP:   (!) 153/79 (!) 176/75  Pulse: 95 93 94 96  Resp: 16 17 16 18   Temp:      TempSrc:      SpO2: 96% 96% 96% 96%  Weight: 68 kg     Height: 5' 3 (1.6 m)       Body mass index is 26.57 kg/m.  Physical Exam   Constitutional: Appears well-developed and well-nourished.  Psych: Affect appropriate to situation, pleasant and cooperative Eyes: No scleral injection.  HENT: No OP obstruction. Head: Normocephalic.  Cardiovascular: Normal rate and regular rhythm.  Respiratory: Effort normal, non-labored breathing. GI: Soft.  No distension. There is no tenderness.  Skin: Warm dry and intact visible skin  Neurologic Examination   Physical Exam  Constitutional: Appears thin, comfortable Psych: Affect appropriate to situation Eyes: No scleral injection HENT: No OP obstrucion MSK: no joint deformities.  Cardiovascular: Normal rate and regular rhythm.  Respiratory: Effort normal, non-labored breathing GI: Soft.  No distension. There is no tenderness.  Skin: WDI  Neuro: Mental Status: Patient is awake, alert, oriented to person, place, month, year, and situation. Patient is able to give a clear and coherent history. No signs of aphasia or neglect Cranial Nerves: II: Visual Fields are full. Pupils are equal, round, and reactive to light.   III,IV, VI: EOMI without ptosis or diploplia.  Occasional slight saccadic intrusions.  Mildly saccadic pursuits V: Facial sensation is symmetric to temperature VII: Facial movement is symmetric.  VIII: hearing is intact to voice X: Uvula elevates symmetrically and is well-visualized XI: Shoulder shrug is symmetric. XII: tongue is midline without atrophy or fasciculations.  Motor: 4+/5 strength throughout Sensory: Sensation is symmetric to light touch and temperature in the arms and legs. Deep Tendon Reflexes: 2+ and symmetric in the biceps and patellae,  brisk.  Cerebellar: FNF and HKS are intact bilaterally    Labs/Imaging/Neurodiagnostic studies   CBC:  Recent Labs  Lab February 05, 2023 2045 02/05/2023 2157  WBC 7.0  --   NEUTROABS  --  6.5  HGB 12.4  --   HCT 37.4  --   MCV 92.6  --   PLT 328  --    Basic Metabolic Panel:  Lab Results  Component Value Date   NA 136 2023-02-05   K 3.5 05-Feb-2023   CO2 28 02/05/2023   GLUCOSE 114 (H) 05-Feb-2023   BUN 9 2023-02-05   CREATININE 0.74 02/05/2023  CALCIUM  9.0 01/14/2023   GFRNONAA >60 01/14/2023   GFRAA 93 02/18/2020   Lipid Panel:  Lab Results  Component Value Date   CHOL 245 (H) 02/18/2020   HDL 98 02/18/2020   LDLCALC 136 (H) 02/18/2020   LDLDIRECT 136 (H) 02/04/2021   TRIG 64 02/18/2020   CHOLHDL 2.5 02/18/2020   HgbA1c:  Lab Results  Component Value Date   HGBA1C 4.9 01/15/2019   Alcohol Level     Component Value Date/Time   ETH <10 01/14/2023 2231   INR  Lab Results  Component Value Date   INR 0.9 01/14/2023   APTT  Lab Results  Component Value Date   APTT 25 01/14/2023    CT Head without contrast(Personally reviewed):  No acute intracranial abnormality.   Mild small vessel ischemic changes.  CT angio Head and Neck with contrast(Personally reviewed):  1. Negative CTA for large vessel occlusion or other emergent finding. 2. Moderate atheromatous change about the carotid bifurcations and carotid siphons without hemodynamically significant stenosis. 3. Emphysema with superimposed pulmonary interstitial congestion/edema.   MRI Brain(Personally reviewed): Pending      ASSESSMENT   JACKQUELYN SUNDBERG is a 78 y.o. woman with past medical history significant for ocular migraines as well as risk factors of hypertension and untreated hyperlipidemia as well as heavy smoking history  Symptoms as described could be consistent with capsular warning syndrome.  Complex migraine is also a possibility but given her age and risk factors full stroke workup  is prudent  RECOMMENDATIONS  -MRI brain without contrast -Echocardiogram -Patient agreed to trial of rosuvastatin , start at 5 mg daily to minimize risk of side effects as she is very anxious about this possibility, uptitrate to achieve goal LDL less than 70 as tolerated -Aspirin  81 mg daily -Defer Plavix at this time at patient request, consider initiation pending MRI and ECHO results -Permissive hypertension pending MRI brain, goal normotension if negative -No need for PT OT and SLP as patient is at her baseline -Stroke team to follow in consultation ______________________________________________________________________   Lola Jernigan MD-PhD [facility] [phone] Available 7 PM to 7 AM, outside of these hours please call Neurologist on call as listed on Amion.

## 2023-01-15 NOTE — Progress Notes (Signed)
 Pt declined to take Propanolol this evening.  She stated she would like to speak with MD tomorrow about starting this new medication. Hilton Sinclair BSN RN CMSRN 01/15/2023, 10:22 PM

## 2023-01-15 NOTE — Progress Notes (Signed)
Patient off the unit for MRI.

## 2023-01-15 NOTE — Plan of Care (Signed)
  Problem: Education: Goal: Knowledge of General Education information will improve Description: Including pain rating scale, medication(s)/side effects and non-pharmacologic comfort measures Outcome: Progressing   Problem: Clinical Measurements: Goal: Respiratory complications will improve Outcome: Progressing Goal: Cardiovascular complication will be avoided Outcome: Progressing   Problem: Activity: Goal: Risk for activity intolerance will decrease Outcome: Progressing   Problem: Elimination: Goal: Will not experience complications related to urinary retention Outcome: Progressing   Problem: Skin Integrity: Goal: Risk for impaired skin integrity will decrease Outcome: Progressing

## 2023-01-15 NOTE — Hospital Course (Addendum)
 Kelly Snyder is a 78 y.o. female with PMH previous tobacco use, COPD, HTN, HLD not on statin, subclavian artery stenosis who presented with left arm and facial numbness.   Numbness and tingling of the left UE and face Ocular migraine In the ED she was hypertensive, vitals otherwise normal. Labs unremarkable including EtOH, UA, BMP, CBC, APTT, INR.  Head CT with no acute pathology.  Neurology consulted recommending admission for observation and stroke workup given risk factors of age, former smoking history, and HLD not on a statin.  She was treated with ASA 325 mg and started on Crestor  5 mg daily. CTA head and MRI brain: No acute intracranial finding. Echo: EF 60 to 65%, overall unremarkable. LDL 117, A1c 5.2.  Symptoms quickly resolved.  She declined starting propranolol  for migraine prophylaxis.  She was discharged with close outpatient follow-up with PCP.  HTN BP elevated as high as 180/73 on admission, improved to systolics of 140 by discharge while on half tablet HCTZ 12.5 mg daily.  Issues for follow-up Ensure patient understands new medications Patient to follow-up with vascular for subclavian artery stenosis, may need new referral Follow-up blood pressure control out of the hospital Discuss propranolol  prophylaxis for migraines

## 2023-01-15 NOTE — Assessment & Plan Note (Addendum)
 Symptoms resolved.  Risk stratification labs reassuring with normal A1c and mildly elevated LDL, though patient has agreed to start statin.  MRI and echo overall unremarkable.  Likely she could discharge with close outpatient follow-up later today pending neurology recommendations. - Neurology following, appreciate recommendations - neuro checks q4h  - Recommend out pt f/u with vascular for further evaluation of left subclavian artery stenosis -Tylenol  as needed

## 2023-01-15 NOTE — ED Notes (Signed)
 ED TO INPATIENT HANDOFF REPORT  ED Nurse Name and Phone #: Albino [phone] S Name/Age/Gender Kelly Snyder 78 y.o. female Room/Bed: 005C/005C  Code Status   Code Status: Full Code  Home/SNF/Other Home Patient oriented to: self, place, time, and situation Is this baseline? Yes   Triage Complete: Triage complete  Chief Complaint Numbness and tingling [R20.0, R20.2]  Triage Note Pt BIB [facility] for numbness to left  and hand side of the face for 15 mins, states she has a headache behind right eye . Ambulatory, A&o x4 . HX of COPD , 96% on RA 176/80, 104 pulse   Allergies Allergies  Allergen Reactions   Prednisone  Itching and Rash    Patient broke out in a full body rash   Tizanidine  Itching and Rash    Patient broke out in a full body rash    Level of Care/Admitting Diagnosis ED Disposition     ED Disposition  Admit   Condition  --   Comment  Hospital Area: [facility] [[id number]]  Level of Care: Med-Surg [16]  May place patient in observation at [facility] or [facility] if equivalent level of care is available:: No  Covid Evaluation: Asymptomatic - no recent exposure (last 10 days) testing not required  Diagnosis: Numbness and tingling [277096]  Admitting Physician: MCDIARMID, TODD D [[id number]]  Attending Physician: MCDIARMID, TODD D [[id number]]          B Medical/Surgery History Past Medical History:  Diagnosis Date   Arthritis    COPD (chronic obstructive pulmonary disease) (HCC)    Pneumonia    Past Surgical History:  Procedure Laterality Date   DILATION AND CURETTAGE OF UTERUS  1970     A IV Location/Drains/Wounds Patient Lines/Drains/Airways Status     Active Line/Drains/Airways     Name Placement date Placement time Site Days   Peripheral IV 01/14/23 20 G Left Antecubital 01/14/23  2008  Antecubital  1            Intake/Output Last 24 hours No intake or output data in the 24 hours ending 01/15/23 0232  Labs/Imaging Results  for orders placed or performed during the hospital encounter of 01/14/23 (from the past 48 hours)  Basic metabolic panel     Status: Abnormal   Collection Time: 01/14/23  8:45 PM  Result Value Ref Range   Sodium 136 135 - 145 mmol/L   Potassium 3.5 3.5 - 5.1 mmol/L   Chloride 96 (L) 98 - 111 mmol/L   CO2 28 22 - 32 mmol/L   Glucose, Bld 114 (H) 70 - 99 mg/dL    Comment: Glucose reference range applies only to samples taken after fasting for at least 8 hours.   BUN 9 8 - 23 mg/dL   Creatinine, Ser 9.25 0.44 - 1.00 mg/dL   Calcium  9.0 8.9 - 10.3 mg/dL   GFR, Estimated >39 >39 mL/min    Comment: (NOTE) Calculated using the CKD-EPI Creatinine Equation (2021)    Anion gap 12 5 - 15    Comment: Performed at [facility], [address] [place], [place] [address]  CBC     Status: None   Collection Time: 01/14/23  8:45 PM  Result Value Ref Range   WBC 7.0 4.0 - 10.5 K/uL   RBC 4.04 3.87 - 5.11 MIL/uL   Hemoglobin 12.4 12.0 - 15.0 g/dL   HCT 62.5 63.9 - 53.9 %   MCV 92.6 80.0 - 100.0 fL  MCH 30.7 26.0 - 34.0 pg   MCHC 33.2 30.0 - 36.0 g/dL   RDW 87.2 88.4 - 84.4 %   Platelets 328 150 - 400 K/uL   nRBC 0.0 0.0 - 0.2 %    Comment: Performed at [facility], [address] [place], [place] [address]  Urinalysis, Routine w reflex microscopic -Urine, Clean Catch     Status: Abnormal   Collection Time: 01/14/23  9:57 PM  Result Value Ref Range   Color, Urine YELLOW YELLOW   APPearance CLEAR CLEAR   Specific Gravity, Urine 1.013 1.005 - 1.030   pH 6.0 5.0 - 8.0   Glucose, UA NEGATIVE NEGATIVE mg/dL   Hgb urine dipstick NEGATIVE NEGATIVE   Bilirubin Urine NEGATIVE NEGATIVE   Ketones, ur 5 (A) NEGATIVE mg/dL   Protein, ur NEGATIVE NEGATIVE mg/dL   Nitrite NEGATIVE NEGATIVE   Leukocytes,Ua NEGATIVE NEGATIVE    Comment: Performed at [facility], [address] [place], [place] [address]  Protime-INR     Status: None   Collection Time: 01/14/23  9:57 PM  Result  Value Ref Range   Prothrombin Time 12.5 11.4 - 15.2 seconds   INR 0.9 0.8 - 1.2    Comment: (NOTE) INR goal varies based on device and disease states. Performed at [facility], [address], [place], [place] [address]   APTT     Status: None   Collection Time: 01/14/23  9:57 PM  Result Value Ref Range   aPTT 25 24 - 36 seconds    Comment: Performed at [facility], [address] [place], [place] [address]  Differential     Status: None   Collection Time: 01/14/23  9:57 PM  Result Value Ref Range   Neutrophils Relative % 76 %   Neutro Abs 6.5 1.7 - 7.7 K/uL   Lymphocytes Relative 15 %   Lymphs Abs 1.2 0.7 - 4.0 K/uL   Monocytes Relative 6 %   Monocytes Absolute 0.5 0.1 - 1.0 K/uL   Eosinophils Relative 2 %   Eosinophils Absolute 0.1 0.0 - 0.5 K/uL   Basophils Relative 1 %   Basophils Absolute 0.1 0.0 - 0.1 K/uL   Immature Granulocytes 0 %   Abs Immature Granulocytes 0.03 0.00 - 0.07 K/uL    Comment: Performed at [facility], [address], [place], [place] [address]  Ethanol     Status: None   Collection Time: 01/14/23 10:31 PM  Result Value Ref Range   Alcohol, Ethyl (B) <10 <10 mg/dL    Comment: (NOTE) Lowest detectable limit for serum alcohol is 10 mg/dL.  For medical purposes only. Performed at [facility], [address], [place], [place] [address]   .Cooxemetry Panel (carboxy, met, total hgb, O2 sat)(NOT AT [facility] or [facility])     Status: Abnormal   Collection Time: 01/15/23 12:36 AM  Result Value Ref Range   Total hemoglobin 12.5 12.0 - 16.0 g/dL   O2 Saturation 01.7 %   Carboxyhemoglobin 2.6 (H) 0.5 - 1.5 %   Methemoglobin 0.8 0.0 - 1.5 %    Comment: Performed at [facility], [address], [place], [place] [address]   CT Angio Head Neck W WO CM Result Date: 01/15/2023 CLINICAL DATA:  Initial evaluation for numbness, stroke/TIA. EXAM: CT ANGIOGRAPHY HEAD AND NECK WITH AND WITHOUT CONTRAST TECHNIQUE: Multidetector CT imaging of  the head and neck was performed using the standard  protocol during bolus administration of intravenous contrast. Multiplanar CT image reconstructions and MIPs were obtained to evaluate the vascular anatomy. Carotid stenosis measurements (when applicable) are obtained utilizing NASCET criteria, using the distal internal carotid diameter as the denominator. RADIATION DOSE REDUCTION: This exam was performed according to the departmental dose-optimization program which includes automated exposure control, adjustment of the mA and/or kV according to patient size and/or use of iterative reconstruction technique. CONTRAST:  75mL OMNIPAQUE  IOHEXOL  350 MG/ML SOLN COMPARISON:  CT from earlier the same day. FINDINGS: CTA NECK FINDINGS Aortic arch: Visualized aortic arch within normal limits for caliber with standard 3 vessel morphology. Moderate aortic atherosclerosis. Right carotid system: Right common and internal carotid arteries are patent without dissection. Moderate atheromatous change about the right carotid bulb without hemodynamically significant greater than 50% stenosis. Left carotid system: Left common and internal carotid arteries are patent without dissection. Moderate atheromatous change about the left carotid bulb without hemodynamically significant greater than 50% stenosis. Vertebral arteries: Both vertebral arteries arise from subclavian arteries. Extensive atheromatous change about the proximal left subclavian artery but without hemodynamically significant stenosis. Vertebral arteries M cells are patent without significant stenosis or dissection. Skeleton: No discrete or worrisome osseous lesions. Moderate cervical spondylosis at C3-4 through C6-7. Other neck: No other acute finding. Upper chest: Emphysema. Superimposed ground-glass density with interlobular septal thickening, consistent with superimposed pulmonary interstitial congestion/edema. Review of the MIP images confirms the above findings CTA HEAD  FINDINGS Anterior circulation: Mild atheromatous change about the carotid siphons without hemodynamically significant stenosis. A1 segments patent bilaterally. Normal anterior communicating artery complex. Anterior cerebral arteries widely patent without stenosis. No M1 stenosis or occlusion. Distal MCA branches perfused and symmetric. Posterior circulation: Both V4 segments patent without stenosis. Left PICA patent. Right PICA origin not well seen. Basilar patent without stenosis. Superior cerebellar and posterior cerebral arteries patent bilaterally. Venous sinuses: Patent allowing for timing the contrast bolus. Anatomic variants: None significant.  No aneurysm. Review of the MIP images confirms the above findings IMPRESSION: 1. Negative CTA for large vessel occlusion or other emergent finding. 2. Moderate atheromatous change about the carotid bifurcations and carotid siphons without hemodynamically significant stenosis. 3. Emphysema with superimposed pulmonary interstitial congestion/edema. Aortic Atherosclerosis (ICD10-I70.0) and Emphysema (ICD10-J43.9). Electronically Signed   By: Morene Hoard M.D.   On: 01/15/2023 02:01   CT HEAD WO CONTRAST Result Date: 01/14/2023 CLINICAL DATA:  Headache, facial numbness EXAM: CT HEAD WITHOUT CONTRAST TECHNIQUE: Contiguous axial images were obtained from the base of the skull through the vertex without intravenous contrast. RADIATION DOSE REDUCTION: This exam was performed according to the departmental dose-optimization program which includes automated exposure control, adjustment of the mA and/or kV according to patient size and/or use of iterative reconstruction technique. COMPARISON:  MRI brain dated 12/28/2021 FINDINGS: Brain: No evidence of acute infarction, hemorrhage, hydrocephalus, extra-axial collection or mass lesion/mass effect. Mild subcortical white matter and periventricular small vessel ischemic changes. Vascular: Mild intracranial atherosclerosis.  Skull: Normal. Negative for fracture or focal lesion. Sinuses/Orbits: The visualized paranasal sinuses are essentially clear. The mastoid air cells are unopacified. Other: None. IMPRESSION: No acute intracranial abnormality. Mild small vessel ischemic changes. Electronically Signed   By: Pinkie Pebbles M.D.   On: 01/14/2023 21:44    Pending Labs Unresulted Labs (From admission, onward)    None       Vitals/Pain Today's Vitals   01/14/23 2041 01/14/23 2045 01/14/23 2100 01/15/23 0036  BP:  (!) 153/79 (!) 176/75 (!) 170/69  Pulse: 93 94  96 99  Resp: 17 16 18 15   Temp:    97.9 F (36.6 C)  TempSrc:    Oral  SpO2: 96% 96% 96% 96%  Weight:      Height:      PainSc:    2     Isolation Precautions No active isolations  Medications Medications  lactated ringers  infusion ( Intravenous New Bag/Given 01/15/23 0029)  hydrochlorothiazide  (HYDRODIURIL ) tablet 6.25 mg (has no administration in time range)  albuterol  (PROVENTIL ) (2.5 MG/3ML) 0.083% nebulizer solution 2.5 mg (has no administration in time range)  umeclidinium bromide  (INCRUSE ELLIPTA ) 62.5 MCG/ACT 1 puff (has no administration in time range)  enoxaparin  (LOVENOX ) injection 40 mg (has no administration in time range)  acetaminophen  (TYLENOL ) tablet 650 mg (has no administration in time range)    Or  acetaminophen  (TYLENOL ) suppository 650 mg (has no administration in time range)  iohexol  (OMNIPAQUE ) 350 MG/ML injection 75 mL (75 mLs Intravenous Contrast Given 01/14/23 2359)  aspirin  EC tablet 325 mg (325 mg Oral Given 01/15/23 0026)    Mobility walks     Focused Assessments Neuro Assessment Handoff:  Swallow screen pass? Yes  Cardiac Rhythm: Sinus tachycardia NIH Stroke Scale  Dizziness Present: No Headache Present: Yes Level of Consciousness (1a.)   : Alert, keenly responsive LOC Questions (1b. )   : Answers both questions correctly LOC Commands (1c. )   : Performs both tasks correctly Best Gaze (2. )  :  Normal Visual (3. )  : No visual loss Facial Palsy (4. )    : Normal symmetrical movements Motor Arm, Left (5a. )   : No drift Motor Arm, Right (5b. ) : No drift Motor Leg, Left (6a. )  : No drift Motor Leg, Right (6b. ) : No drift Limb Ataxia (7. ): Absent Sensory (8. )  : Mild-to-moderate sensory loss, patient feels pinprick is less sharp or is dull on the affected side, or there is a loss of superficial pain with pinprick, but patient is aware of being touched Best Language (9. )  : No aphasia Dysarthria (10. ): Normal Extinction/Inattention (11.)   : No Abnormality Complete NIHSS TOTAL: 1     Neuro Assessment: Exceptions to WDL Neuro Checks:      Has TPA been given? No If patient is a Neuro Trauma and patient is going to OR before floor call report to 4N Charge nurse: [phone] or [phone]   R Recommendations: See Admitting Provider Note  Report given to:  Additional Notes: NA

## 2023-01-15 NOTE — Progress Notes (Addendum)
     Daily Progress Note Intern Pager: [phone]  Patient name: Kelly Snyder Medical record number: [id number] Date of birth: Aug 01, 1945 Age: 78 y.o. Gender: female  Primary Care Provider: Joshua Domino, DO Consultants: Neurology Code Status: Full  Pt Overview and Major Events to Date:  1/11-admitted  Assessment and Plan:  Kelly Snyder. Circle is a 78 year old female who presented with left arm and facial numbness which has since resolved on admission felt to be due to migraine with sensory aura versus TIA versus capsular warning syndrome. Pertinent PMH/PSH includes COPD, hypertension, HLD not on medication, subclavian artery stenosis, and previous tobacco use.  Assessment & Plan Transient neurological symptoms Symptoms resolved.  Risk stratification labs reassuring with normal A1c and mildly elevated LDL, though patient has agreed to start statin.  MRI and echo overall unremarkable.  Likely she could discharge with close outpatient follow-up later today pending neurology recommendations. - Neurology following, appreciate recommendations - neuro checks q4h  - Recommend out pt f/u with vascular for further evaluation of left subclavian artery stenosis -Tylenol  as needed HTN (hypertension) BP mildly elevated over admission; patient has remained asymptomatic. Takes HCTZ 12.5 mg half tablet daily.  BP has been on lower end at office visits in the past. -Continue HCTZ 12.5 mg (half tablet) daily and recommend outpatient follow-up -Need to titrate BP meds to R arm (L arm with subclavian artery stenosis)  Chronic and Stable Issues: COPD-continue Incruse Ellipta  (formulary equivalent for Spiriva ) and albuterol  as needed  FEN/GI: Regular diet PPx: Lovenox  Dispo: Possible discharge today 1/12 pending neurology recommendations  Subjective:  Doing well this morning.  Symptoms are fully resolved.  She hopes she can go home.  Objective: Temp:  [97.2 F (36.2 C)-98.8 F (37.1 C)] 97.7 F (36.5  C) (01/11 1953) Pulse Rate:  [78-99] 95 (01/11 1953) Resp:  [13-18] 16 (01/11 1953) BP: (112-170)/(56-78) 141/74 (01/11 1953) SpO2:  [92 %-97 %] 94 % (01/11 1953) Physical Exam: General: Sitting up in bed, no acute distress, alert and oriented Cardiovascular: Regular rate and rhythm without murmurs rubs or gallops Respiratory: Clear to auscultation bilaterally anteriorly without wheezes rales or rhonchi Abdomen: Soft, nontender, nondistended, normoactive bowel sounds Extremities: Moves all extremities grossly equally Neurological: Cranial nerves II through XII intact, strength and sensation intact throughout  Laboratory: Most recent CBC Lab Results  Component Value Date   WBC 7.0 01/14/2023   HGB 12.4 01/14/2023   HCT 37.4 01/14/2023   MCV 92.6 01/14/2023   PLT 328 01/14/2023   Most recent BMP    Latest Ref Rng & Units 01/14/2023    8:45 PM  BMP  Glucose 70 - 99 mg/dL 885   BUN 8 - 23 mg/dL 9   Creatinine 9.55 - 8.99 mg/dL 9.25   Sodium 864 - 854 mmol/L 136   Potassium 3.5 - 5.1 mmol/L 3.5   Chloride 98 - 111 mmol/L 96   CO2 22 - 32 mmol/L 28   Calcium  8.9 - 10.3 mg/dL 9.0     Other pertinent labs: A1c 5.2, LDL 117  Imaging/Diagnostic Tests: MRI brain-no acute intracranial abnormality Echo-EF 60-65% with normal bilateral atria  Tharon Lung, MD 01/15/2023, 9:14 PM  PGY-2, [facility] FPTS Intern pager: [phone], text pages welcome Secure chat group [facility]: [facility] Teaching Service

## 2023-01-15 NOTE — H&P (Addendum)
 Hospital Admission History and Physical Service Pager: [phone]  Patient name: Kelly Snyder Medical record number: [id number] Date of Birth: [birth year] Age: 78 y.o. Gender: female  Primary Care Provider: Joshua Domino, DO Consultants: Neurology  Code Status: Full Code  Preferred Emergency Contact:  Contact Information     Name Relation Home Work [place] Son [phone]  [phone]      Other Contacts   None on File     Chief Complaint: L arm and facial numbness   Assessment and Plan: Kelly Snyder is a 78 y.o. female presenting with left arm and facial numbness which has since resolved. Differential for this patient's presentation of this includes: -complex migraine, does have history of migraines  -Left subclavian artery stenosis -heavily calcified, last followed by vascular in 2020 with plans to monitor for symptoms rather than address the stenosis  -TIA, does have risk factors and symptoms have resolved -CVA, less likely as symptoms resolved and head CT with no acute pathology Assessment & Plan Numbness and tingling Left UE and facial numbness/tingling now completely resolved.  Visual changes consistent with ocular migraine also completely resolved.  Admitted for observation and to r/o acute intracranial pathology with the CTA head and MRI head per neurology.  CTA with no acute findings. S/p 1 dose ASA 325 mg.  Has wood stove at home, co-ox ordered in ED - Admit to MedSurg, attending Dr. McDiarmid  - Neurology following, appreciate recommendations - f/u MR brain - neuro checks q4h  - Recommend out pt f/u with vascular for further evaluation of left subclavian artery stenosis - Plan to discuss starting a statin (on chart review, has declined in the past) - f/u lipid panel  - f/u co-ox -Tylenol  as needed HTN (hypertension) BP elevated most recently 170/60s and currently asymptomatic. Takes HCTZ 12.5 mg half tablet daily.  BP has been on lower end  at office visits in the past -CTM -Permissive hypertension while awaiting MRI - Need to titrate BP meds to R arm (L arm with subclavian artery stenosis)  Chronic and Stable Conditions: COPD - continue home inhalers  FEN/GI: Regular diet VTE Prophylaxis: Lovenox   Disposition: Med surg  History of Present Illness:  Kelly Snyder is a 78 y.o. female presenting with left-sided upper extremity and facial numbness. Patient states she felt like her left hand was numb earlier this evening around 730.  This feeling spread up her arm into the left side of her face. She called EMS and reports that her BP was high. Reports that her numbness improved after about 20-30 minutes after it started, prior to EMS coming. She endorses some tingling in her L hand which occurred in the ED and has since resolved.  She also endorses some visual auras from her R to L eye associated with a right frontal headache while being in the ED which lasted for about 20 minutes and has completely resolved.  States she has been having similar episodes for the past 20 years, sees eye doctor for ocular migraines.  She denies any dizziness, no balance issues, weakness, chest pain, worsening shortness of breath (has COPD), recent sick symptoms (N/V/D/ worsening cough/fever/chills).  In the ED, vitals have remained stable, labs unremarkable including EtOH, UA, BMP, CBC, APTT, INR.  Head CT with no acute pathology.  Neurology consulted recommending head CT angiogram and MRI and ASA 325 mg.  Review Of Systems: Per HPI   Pertinent Past Medical History: Previous tobacco use COPD  HTN HLD - not on statin medication - has declined in the past  Subclavian artery stenosis Remainder reviewed in history tab.   Pertinent Past Surgical History: None pertinent Remainder reviewed in history tab.   Pertinent Social History: Tobacco use: Former Alcohol use: No Other Substance use: No Grandson and his girlfriend live with  her  Pertinent Family History: Mother-diabetes heart disease Father-heart disease Sister-Parkinson's and dementia Remainder reviewed in history tab.   Important Outpatient Medications: Pepcid  20 mg daily - not taking HCTZ 12.5 mg half tablet daily Spiriva  2 puffs daily Albuterol  as needed Remainder reviewed in medication history.   Objective: BP (!) 176/75   Pulse 96   Temp 98.3 F (36.8 C) (Oral)   Resp 18   Ht 5' 3 (1.6 m)   Wt 68 kg   SpO2 96%   BMI 26.57 kg/m  Exam: General: Well-appearing 79 year old female lying in bed, NAD Eyes: White sclera, clear conjunctiva ENTM: MMM Neck: Supple Cardiovascular: RRR, normal S1/S2 Respiratory: Very mild crackles at bases, good air movement, breathing comfortably on room air Gastrointestinal: Bowel sounds present, soft, nontender palpation, nondistended Extremities: No edema BLEs Derm: Warm and dry Neuro: Alert and oriented.  Cranial nerves II through XII intact, sensation intact, finger-nose-finger test normal Psych: Mood and affect appropriate for situation  Labs:  CBC BMET  Recent Labs  Lab 01/14/23 2045  WBC 7.0  HGB 12.4  HCT 37.4  PLT 328   Recent Labs  Lab 01/14/23 2045  NA 136  K 3.5  CL 96*  CO2 28  BUN 9  CREATININE 0.74  GLUCOSE 114*  CALCIUM  9.0     EKG: My own interpretation (not copied from electronic read)  Sinus rhythm, 92 bpm, no ST elevation   Imaging Studies Performed:  CT head and CTA head: No acute pathology   Joshua Domino, DO 01/15/2023, 12:25 AM PGY-3, [facility]  FPTS Intern pager: [phone], text pages welcome Secure chat group [facility] Teaching Service

## 2023-01-16 DIAGNOSIS — G4489 Other headache syndrome: Secondary | ICD-10-CM | POA: Diagnosis not present

## 2023-01-16 DIAGNOSIS — R29818 Other symptoms and signs involving the nervous system: Secondary | ICD-10-CM | POA: Diagnosis not present

## 2023-01-16 MED ORDER — ASPIRIN 81 MG PO CHEW: 81.0000 mg | CHEWABLE_TABLET | Freq: Every day | ORAL | Status: AC

## 2023-01-16 MED ORDER — ROSUVASTATIN CALCIUM 5 MG PO TABS: 5.0000 mg | ORAL_TABLET | Freq: Every day | ORAL | 0 refills | Status: DC

## 2023-01-16 NOTE — Discharge Summary (Signed)
 Family Medicine Teaching [facility] Discharge Summary  Patient name: Kelly Snyder Medical record number: [id number] Date of birth: [birth year] Age: 78 y.o. Gender: female Date of Admission: 01/14/2023  Date of Discharge: 01/16/2023 Admitting Physician: Krystal BIRCH McDiarmid, MD  Primary Care Provider: Joshua Domino, DO Consultants: Neurology  Indication for Hospitalization: Observation and further workup of transient neurological symptoms  Discharge Diagnoses/Problem List:  Principal Problem for Admission: Transient neurological symptoms Other Problems addressed during stay:  Principal Problem:   Transient neurological symptoms Active Problems:   Hyperlipidemia   HTN (hypertension)   Other headache syndrome  Brief Hospital Course:  Kelly Snyder is a 78 y.o. female with PMH previous tobacco use, COPD, HTN, HLD not on statin, subclavian artery stenosis who presented with left arm and facial numbness.   Numbness and tingling of the left UE and face Ocular migraine In the ED she was hypertensive, vitals otherwise normal. Labs unremarkable including EtOH, UA, BMP, CBC, APTT, INR.  Head CT with no acute pathology.  Neurology consulted recommending admission for observation and stroke workup given risk factors of age, former smoking history, and HLD not on a statin.  She was treated with ASA 325 mg and started on Crestor  5 mg daily. CTA head and MRI brain: No acute intracranial finding. Echo: EF 60 to 65%, overall unremarkable. LDL 117, A1c 5.2.  Symptoms quickly resolved.  She declined starting propranolol  for migraine prophylaxis.  She was discharged with close outpatient follow-up with PCP.  HTN BP elevated as high as 180/73 on admission, improved to systolics of 140 by discharge while on half tablet HCTZ 12.5 mg daily.  Issues for follow-up Ensure patient understands new medications Patient to follow-up with vascular for subclavian artery stenosis, may need new referral Follow-up  blood pressure control out of the hospital Discuss propranolol  prophylaxis for migraines  Disposition: Home  Discharge Condition: Stable  Discharge Exam:  Vitals:   01/16/23 0417 01/16/23 0741  BP: 119/64 126/61  Pulse: 84 91  Resp: 16 18  Temp: 97.8 F (36.6 C) 97.9 F (36.6 C)  SpO2: 94% 95%   General: Sitting up in bed, no acute distress, alert and oriented Cardiovascular: Regular rate and rhythm without murmurs rubs or gallops Respiratory: Clear to auscultation bilaterally anteriorly without wheezes rales or rhonchi Abdomen: Soft, nontender, nondistended, normoactive bowel sounds Extremities: Moves all extremities grossly equally Neurological: Cranial nerves II through XII intact, strength and sensation intact throughout  Significant Labs and Imaging:  Recent Labs  Lab 01/14/23 2045  WBC 7.0  HGB 12.4  HCT 37.4  PLT 328   Recent Labs  Lab 01/14/23 2045  NA 136  K 3.5  CL 96*  CO2 28  GLUCOSE 114*  BUN 9  CREATININE 0.74  CALCIUM  9.0   ECHOCARDIOGRAM COMPLETE Result Date: 01/15/2023    ECHOCARDIOGRAM REPORT   Patient Name:   Kelly Snyder Date of Exam: 01/15/2023 Medical Rec #:  [id number]       Height:       63.0 in Accession #:    [id number]      Weight:       150.0 lb Date of Birth:  Jun 20, 1945        BSA:          1.711 m Patient Age:    77 years        BP:           131/62 mmHg Patient Gender: F  HR:           80 bpm. Exam Location:  Inpatient Procedure: 2D Echo, Color Doppler and Cardiac Doppler Indications:    TIA  History:        Patient has no prior history of Echocardiogram examinations.                 COPD; Risk Factors:Hypertension and Current Smoker.  Sonographer:    Amy Chionchio Referring Phys: [id number] SRISHTI L BHAGAT IMPRESSIONS  1. Left ventricular ejection fraction, by estimation, is 60 to 65%. The left ventricle has normal function. The left ventricle has no regional wall motion abnormalities. There is mild concentric left ventricular  hypertrophy. Left ventricular diastolic parameters are indeterminate.  2. Right ventricular systolic function is normal. The right ventricular size is normal. There is normal pulmonary artery systolic pressure.  3. The mitral valve is normal in structure. No evidence of mitral valve regurgitation. No evidence of mitral stenosis.  4. Tricuspid valve regurgitation is moderate.  5. The aortic valve is normal in structure. Aortic valve regurgitation is not visualized. No aortic stenosis is present.  6. The inferior vena cava is normal in size with greater than 50% respiratory variability, suggesting right atrial pressure of 3 mmHg. FINDINGS  Left Ventricle: Left ventricular ejection fraction, by estimation, is 60 to 65%. The left ventricle has normal function. The left ventricle has no regional wall motion abnormalities. The left ventricular internal cavity size was normal in size. There is  mild concentric left ventricular hypertrophy. Left ventricular diastolic parameters are indeterminate. Right Ventricle: The right ventricular size is normal. No increase in right ventricular wall thickness. Right ventricular systolic function is normal. There is normal pulmonary artery systolic pressure. The tricuspid regurgitant velocity is 2.79 m/s, and  with an assumed right atrial pressure of 3 mmHg, the estimated right ventricular systolic pressure is 34.1 mmHg. Left Atrium: Left atrial size was normal in size. Right Atrium: Right atrial size was normal in size. Pericardium: There is no evidence of pericardial effusion. Presence of epicardial fat layer. Mitral Valve: The mitral valve is normal in structure. No evidence of mitral valve regurgitation. No evidence of mitral valve stenosis. MV peak gradient, 5.1 mmHg. The mean mitral valve gradient is 2.0 mmHg. Tricuspid Valve: The tricuspid valve is normal in structure. Tricuspid valve regurgitation is moderate . No evidence of tricuspid stenosis. Aortic Valve: The aortic valve is  normal in structure. Aortic valve regurgitation is not visualized. No aortic stenosis is present. Aortic valve mean gradient measures 5.0 mmHg. Aortic valve peak gradient measures 8.8 mmHg. Aortic valve area, by VTI measures 1.83 cm. Pulmonic Valve: The pulmonic valve was normal in structure. Pulmonic valve regurgitation is not visualized. No evidence of pulmonic stenosis. Aorta: The aortic root is normal in size and structure. Venous: The inferior vena cava is normal in size with greater than 50% respiratory variability, suggesting right atrial pressure of 3 mmHg. IAS/Shunts: No atrial level shunt detected by color flow Doppler.  LEFT VENTRICLE PLAX 2D LVIDd:         4.30 cm LVIDs:         2.90 cm LV PW:         1.00 cm LV IVS:        0.90 cm LVOT diam:     2.00 cm LV SV:         52 LV SV Index:   30 LVOT Area:     3.14 cm  LV Volumes (MOD)  LV vol d, MOD A2C: 41.7 ml LV vol d, MOD A4C: 47.4 ml LV vol s, MOD A2C: 16.5 ml LV vol s, MOD A4C: 18.7 ml LV SV MOD A2C:     25.2 ml LV SV MOD A4C:     47.4 ml LV SV MOD BP:      26.6 ml RIGHT VENTRICLE             IVC RV Basal diam:  2.20 cm     IVC diam: 1.30 cm RV S prime:     11.50 cm/s TAPSE (M-mode): 1.8 cm LEFT ATRIUM             Index        RIGHT ATRIUM           Index LA Vol (A2C):   32.6 ml 19.05 ml/m  RA Area:     15.70 cm LA Vol (A4C):   28.2 ml 16.48 ml/m  RA Volume:   40.80 ml  23.84 ml/m LA Biplane Vol: 32.2 ml 18.82 ml/m  AORTIC VALVE                     PULMONIC VALVE AV Area (Vmax):    1.95 cm      PV Vmax:       0.82 m/s AV Area (Vmean):   1.71 cm      PV Peak grad:  2.7 mmHg AV Area (VTI):     1.83 cm AV Vmax:           148.00 cm/s AV Vmean:          108.000 cm/s AV VTI:            0.283 m AV Peak Grad:      8.8 mmHg AV Mean Grad:      5.0 mmHg LVOT Vmax:         91.80 cm/s LVOT Vmean:        58.900 cm/s LVOT VTI:          0.165 m LVOT/AV VTI ratio: 0.58  AORTA Ao Root diam: 2.80 cm MITRAL VALVE                TRICUSPID VALVE MV Area (PHT): 2.62  cm     TR Peak grad:   31.1 mmHg MV Area VTI:   1.73 cm     TR Vmax:        279.00 cm/s MV Peak grad:  5.1 mmHg MV Mean grad:  2.0 mmHg     SHUNTS MV Vmax:       1.13 m/s     Systemic VTI:  0.16 m MV Vmean:      72.2 cm/s    Systemic Diam: 2.00 cm MV Decel Time: 289 msec MV E velocity: 80.20 cm/s MV A velocity: 115.00 cm/s MV E/A ratio:  0.70 Kardie Tobb DO Electronically signed by Dub Huntsman DO Signature Date/Time: 01/15/2023/4:43:20 PM    Final    MR Brain Wo Contrast (neuro protocol) Result Date: 01/15/2023 CLINICAL DATA:  Neuro deficit, acute, stroke suspected. Facial numbness. EXAM: MRI HEAD WITHOUT CONTRAST TECHNIQUE: Multiplanar, multiecho pulse sequences of the brain and surrounding structures were obtained without intravenous contrast. COMPARISON:  MRI head 12/28/2021.  CT head 01/14/2023. FINDINGS: Brain: No acute infarction, hemorrhage, hydrocephalus, extra-axial collection or mass lesion. Mild for age T2/FLAIR hyperintensities in the white matter are nonspecific but compatible with chronic microvascular ischemic disease. Vascular: Major arterial flow voids are maintained  at the skull base. Skull and upper cervical spine: Normal marrow signal. Sinuses/Orbits: Clear sinuses.  No acute orbital findings. Other: No mastoid effusions. IMPRESSION: No evidence of acute intracranial abnormality. Electronically Signed   By: Gilmore GORMAN Molt M.D.   On: 01/15/2023 09:17   CT Angio Head Neck W WO CM Result Date: 01/15/2023 CLINICAL DATA:  Initial evaluation for numbness, stroke/TIA. EXAM: CT ANGIOGRAPHY HEAD AND NECK WITH AND WITHOUT CONTRAST TECHNIQUE: Multidetector CT imaging of the head and neck was performed using the standard protocol during bolus administration of intravenous contrast. Multiplanar CT image reconstructions and MIPs were obtained to evaluate the vascular anatomy. Carotid stenosis measurements (when applicable) are obtained utilizing NASCET criteria, using the distal internal carotid  diameter as the denominator. RADIATION DOSE REDUCTION: This exam was performed according to the departmental dose-optimization program which includes automated exposure control, adjustment of the mA and/or kV according to patient size and/or use of iterative reconstruction technique. CONTRAST:  75mL OMNIPAQUE  IOHEXOL  350 MG/ML SOLN COMPARISON:  CT from earlier the same day. FINDINGS: CTA NECK FINDINGS Aortic arch: Visualized aortic arch within normal limits for caliber with standard 3 vessel morphology. Moderate aortic atherosclerosis. Right carotid system: Right common and internal carotid arteries are patent without dissection. Moderate atheromatous change about the right carotid bulb without hemodynamically significant greater than 50% stenosis. Left carotid system: Left common and internal carotid arteries are patent without dissection. Moderate atheromatous change about the left carotid bulb without hemodynamically significant greater than 50% stenosis. Vertebral arteries: Both vertebral arteries arise from subclavian arteries. Extensive atheromatous change about the proximal left subclavian artery but without hemodynamically significant stenosis. Vertebral arteries M cells are patent without significant stenosis or dissection. Skeleton: No discrete or worrisome osseous lesions. Moderate cervical spondylosis at C3-4 through C6-7. Other neck: No other acute finding. Upper chest: Emphysema. Superimposed ground-glass density with interlobular septal thickening, consistent with superimposed pulmonary interstitial congestion/edema. Review of the MIP images confirms the above findings CTA HEAD FINDINGS Anterior circulation: Mild atheromatous change about the carotid siphons without hemodynamically significant stenosis. A1 segments patent bilaterally. Normal anterior communicating artery complex. Anterior cerebral arteries widely patent without stenosis. No M1 stenosis or occlusion. Distal MCA branches perfused and  symmetric. Posterior circulation: Both V4 segments patent without stenosis. Left PICA patent. Right PICA origin not well seen. Basilar patent without stenosis. Superior cerebellar and posterior cerebral arteries patent bilaterally. Venous sinuses: Patent allowing for timing the contrast bolus. Anatomic variants: None significant.  No aneurysm. Review of the MIP images confirms the above findings IMPRESSION: 1. Negative CTA for large vessel occlusion or other emergent finding. 2. Moderate atheromatous change about the carotid bifurcations and carotid siphons without hemodynamically significant stenosis. 3. Emphysema with superimposed pulmonary interstitial congestion/edema. Aortic Atherosclerosis (ICD10-I70.0) and Emphysema (ICD10-J43.9). Electronically Signed   By: Morene Hoard M.D.   On: 01/15/2023 02:01   CT HEAD WO CONTRAST Result Date: 01/14/2023 CLINICAL DATA:  Headache, facial numbness EXAM: CT HEAD WITHOUT CONTRAST TECHNIQUE: Contiguous axial images were obtained from the base of the skull through the vertex without intravenous contrast. RADIATION DOSE REDUCTION: This exam was performed according to the departmental dose-optimization program which includes automated exposure control, adjustment of the mA and/or kV according to patient size and/or use of iterative reconstruction technique. COMPARISON:  MRI brain dated 12/28/2021 FINDINGS: Brain: No evidence of acute infarction, hemorrhage, hydrocephalus, extra-axial collection or mass lesion/mass effect. Mild subcortical white matter and periventricular small vessel ischemic changes. Vascular: Mild intracranial atherosclerosis. Skull: Normal. Negative for fracture  or focal lesion. Sinuses/Orbits: The visualized paranasal sinuses are essentially clear. The mastoid air cells are unopacified. Other: None. IMPRESSION: No acute intracranial abnormality. Mild small vessel ischemic changes. Electronically Signed   By: Pinkie Pebbles M.D.   On:  01/14/2023 21:44    Discharge Medications:  Allergies as of 01/16/2023       Reactions   Prednisone  Itching, Rash   Patient broke out in a full body rash   Tizanidine  Itching, Rash   Patient broke out in a full body rash        Medication List     TAKE these medications    acetaminophen  500 MG tablet Commonly known as: TYLENOL  Take 1,000 mg by mouth every 6 (six) hours as needed for moderate pain (pain).   albuterol  108 (90 Base) MCG/ACT inhaler Commonly known as: VENTOLIN  HFA Inhale 2 puffs into the lungs every 6 (six) hours as needed for wheezing or shortness of breath.   Aspercreme Max Strength 4 % Aero Generic drug: Lidocaine  Apply 1 Application topically daily as needed (hip arthritis pain).   aspirin  81 MG chewable tablet Chew 1 tablet (81 mg total) by mouth daily.   CALCIUM  600-D PO Take 1 tablet by mouth daily.   hydrochlorothiazide  12.5 MG tablet Commonly known as: HYDRODIURIL  TAKE 1/2 TABLET BY MOUTH DAILY   multivitamin with minerals Tabs tablet Take 1 tablet by mouth daily.   rosuvastatin  5 MG tablet Commonly known as: CRESTOR  Take 1 tablet (5 mg total) by mouth daily.   Spiriva  Respimat 2.5 MCG/ACT Aers Generic drug: Tiotropium Bromide  Monohydrate INHALE 2 PUFFS BY MOUTH INTO THE LUNGS DAILY       Discharge Instructions: Please refer to Patient Instructions section of EMR for full details.  Patient was counseled important signs and symptoms that should prompt return to medical care, changes in medications, dietary instructions, activity restrictions, and follow up appointments.   Follow-Up Appointments:  Follow-up Information     Joshua Domino, DO. Schedule an appointment as soon as possible for a visit.   Specialty: Family Medicine Why: Make an appointment for hospital follow-up with your PCP ASAP. Contact information: [address] [place] [address] [phone]                Howell Lunger, DO 01/16/2023, 10:38  AM PGY-2, [facility]

## 2023-01-16 NOTE — Progress Notes (Signed)
 AVS instructions given, patient verbalized understanding, dropped to main entrance A to son's car

## 2023-01-16 NOTE — Care Management Obs Status (Signed)
 MEDICARE OBSERVATION STATUS NOTIFICATION   Patient Details  Name: Kelly Snyder MRN: [id number] Date of Birth: [birth year]   Medicare Observation Status Notification Given:  Yes    Lawerance Sabal, RN 01/16/2023, 8:11 AM

## 2023-01-16 NOTE — Plan of Care (Signed)
  Problem: Education: Goal: Knowledge of General Education information will improve Description: Including pain rating scale, medication(s)/side effects and non-pharmacologic comfort measures Outcome: Adequate for Discharge   Problem: Health Behavior/Discharge Planning: Goal: Ability to manage health-related needs will improve Outcome: Adequate for Discharge   Problem: Clinical Measurements: Goal: Ability to maintain clinical measurements within normal limits will improve Outcome: Adequate for Discharge Goal: Will remain free from infection Outcome: Adequate for Discharge Goal: Diagnostic test results will improve Outcome: Adequate for Discharge Goal: Respiratory complications will improve Outcome: Adequate for Discharge Goal: Cardiovascular complication will be avoided Outcome: Adequate for Discharge   Problem: Activity: Goal: Risk for activity intolerance will decrease Outcome: Adequate for Discharge   Problem: Nutrition: Goal: Adequate nutrition will be maintained Outcome: Adequate for Discharge   Problem: Coping: Goal: Level of anxiety will decrease Outcome: Adequate for Discharge   Problem: Elimination: Goal: Will not experience complications related to bowel motility Outcome: Adequate for Discharge Goal: Will not experience complications related to urinary retention Outcome: Adequate for Discharge   Problem: Pain Management: Goal: General experience of comfort will improve Outcome: Adequate for Discharge   Problem: Safety: Goal: Ability to remain free from injury will improve Outcome: Adequate for Discharge   Problem: Skin Integrity: Goal: Risk for impaired skin integrity will decrease Outcome: Adequate for Discharge   Problem: Education: Goal: Knowledge of disease or condition will improve Outcome: Adequate for Discharge Goal: Knowledge of secondary prevention will improve (MUST DOCUMENT ALL) Outcome: Adequate for Discharge Goal: Knowledge of patient  specific risk factors will improve Loraine Leriche N/A or DELETE if not current risk factor) Outcome: Adequate for Discharge   Problem: Ischemic Stroke/TIA Tissue Perfusion: Goal: Complications of ischemic stroke/TIA will be minimized Outcome: Adequate for Discharge   Problem: Coping: Goal: Will verbalize positive feelings about self Outcome: Adequate for Discharge Goal: Will identify appropriate support needs Outcome: Adequate for Discharge   Problem: Health Behavior/Discharge Planning: Goal: Ability to manage health-related needs will improve Outcome: Adequate for Discharge Goal: Goals will be collaboratively established with patient/family Outcome: Adequate for Discharge   Problem: Self-Care: Goal: Ability to participate in self-care as condition permits will improve Outcome: Adequate for Discharge Goal: Verbalization of feelings and concerns over difficulty with self-care will improve Outcome: Adequate for Discharge Goal: Ability to communicate needs accurately will improve Outcome: Adequate for Discharge   Problem: Nutrition: Goal: Risk of aspiration will decrease Outcome: Adequate for Discharge Goal: Dietary intake will improve Outcome: Adequate for Discharge

## 2023-01-18 ENCOUNTER — Ambulatory Visit (INDEPENDENT_AMBULATORY_CARE_PROVIDER_SITE_OTHER): Payer: 59 | Admitting: Family Medicine

## 2023-01-18 ENCOUNTER — Encounter: Payer: Self-pay | Admitting: Family Medicine

## 2023-01-18 VITALS — BP 133/63 | HR 85 | Ht 63.0 in | Wt 157.5 lb

## 2023-01-18 DIAGNOSIS — E785 Hyperlipidemia, unspecified: Secondary | ICD-10-CM

## 2023-01-18 DIAGNOSIS — I771 Stricture of artery: Secondary | ICD-10-CM | POA: Diagnosis not present

## 2023-01-18 DIAGNOSIS — I10 Essential (primary) hypertension: Secondary | ICD-10-CM

## 2023-01-18 NOTE — Assessment & Plan Note (Signed)
 At goal.  Continue hydrochlorothiazide.

## 2023-01-18 NOTE — Assessment & Plan Note (Signed)
 Was started on rosuvastatin  5 mg while admitted, was started on low-dose due to patient preference.  Patient has been taking this medication without signs of allergic reaction, I reassured patient that she is unlikely to be allergic to this medication.   - Patient would like to continue at 5 mg dose for now.   - Her LDL while admitted was above goal of 70, plan to recheck lipid panel in about 1 month.  If still elevated, consider increasing Crestor . -Counseled patient on dietary changes to help decrease cholesterol, provided handout

## 2023-01-18 NOTE — Progress Notes (Signed)
    SUBJECTIVE:   CHIEF COMPLAINT / HPI:   Kelly Snyder is a 78yo F w/ hx of HTN, HLD that p/f hospital f/u. - Admitted 1/10-1/12 for L sided weakness, thought to be ocular migraine. Neurology evaluated, CTA and MRI brain w/o evidence of stroke.  - She had initially felt L arm and facial numbness.  - Pt reports feeling back to baseline now. Reports the numbness and weakness only happened for about 15-54minutes. Has not reoccurred since then.   - Pt is worried about new medications because she doesn't want to have an allergic reaction.  She reports having hives after starting a medication in the emergency department in the past so she has been cautious about new medication since then.  OBJECTIVE:   BP 133/63   Pulse 85   Ht 5' 3 (1.6 m)   Wt 157 lb 8 oz (71.4 kg)   SpO2 97%   BMI 27.90 kg/m   General: Alert, pleasant woman speaking in full sentences on room air. NAD. HEENT: NCAT. MMM.  Facial expressions symmetric.  Speech normal. CV: RRR, no murmurs.  Resp: CTAB, no wheezing or crackles. Normal WOB on RA.  Abm: Soft, nontender, nondistended. BS present. Ext: Moves all ext spontaneously Skin: Warm, well perfused   ASSESSMENT/PLAN:   Assessment & Plan Subclavian arterial stenosis (HCC) Was noted in her admission and was recommended vascular surgery follow-up.  Will send referral for vascular surgery. Hyperlipidemia, unspecified hyperlipidemia type Was started on rosuvastatin  5 mg while admitted, was started on low-dose due to patient preference.  Patient has been taking this medication without signs of allergic reaction, I reassured patient that she is unlikely to be allergic to this medication.   - Patient would like to continue at 5 mg dose for now.   - Her LDL while admitted was above goal of 70, plan to recheck lipid panel in about 1 month.  If still elevated, consider increasing Crestor . -Counseled patient on dietary changes to help decrease cholesterol, provided handout Primary  hypertension At goal.  Continue hydrochlorothiazide .   Follow-up in 1 month  Twyla Nearing, MD [facility]

## 2023-01-18 NOTE — Patient Instructions (Signed)
 Good to see you today - Thank you for coming in  Things we discussed today:  1) I want sending a referral for you to see a vascular doctor.  They can help evaluate the subclavian artery stenosis that was noted during her hospitalization.  2) continue taking rosuvastatin  to help control your cholesterol levels. -I am also including a handout with more information on how to lower your cholesterol.  Come back in 1 month

## 2023-01-26 ENCOUNTER — Ambulatory Visit: Payer: 59

## 2023-02-07 ENCOUNTER — Other Ambulatory Visit: Payer: Self-pay | Admitting: Family Medicine

## 2023-02-09 ENCOUNTER — Other Ambulatory Visit: Payer: Self-pay | Admitting: Student

## 2023-02-09 DIAGNOSIS — J439 Emphysema, unspecified: Secondary | ICD-10-CM

## 2023-02-10 ENCOUNTER — Ambulatory Visit
Admission: RE | Admit: 2023-02-10 | Discharge: 2023-02-10 | Disposition: A | Discharge status: Home / Self Care | Payer: 59 | Source: Ambulatory Visit | Attending: Family Medicine | Admitting: Family Medicine

## 2023-02-10 DIAGNOSIS — Z1231 Encounter for screening mammogram for malignant neoplasm of breast: Secondary | ICD-10-CM

## 2023-02-15 ENCOUNTER — Other Ambulatory Visit: Payer: Self-pay | Admitting: Student

## 2023-02-17 ENCOUNTER — Encounter: Payer: Self-pay | Admitting: Student

## 2023-02-17 ENCOUNTER — Ambulatory Visit: Payer: 59 | Admitting: Student

## 2023-02-17 VITALS — BP 142/66 | HR 89 | Ht 63.0 in | Wt 157.0 lb

## 2023-02-17 DIAGNOSIS — L219 Seborrheic dermatitis, unspecified: Secondary | ICD-10-CM | POA: Diagnosis not present

## 2023-02-17 DIAGNOSIS — R21 Rash and other nonspecific skin eruption: Secondary | ICD-10-CM | POA: Diagnosis not present

## 2023-02-17 DIAGNOSIS — I10 Essential (primary) hypertension: Secondary | ICD-10-CM | POA: Diagnosis not present

## 2023-02-17 DIAGNOSIS — E785 Hyperlipidemia, unspecified: Secondary | ICD-10-CM | POA: Diagnosis not present

## 2023-02-17 HISTORY — DX: Rash and other nonspecific skin eruption: R21

## 2023-02-17 MED ORDER — ATORVASTATIN CALCIUM 10 MG PO TABS: 10.0000 mg | ORAL_TABLET | Freq: Every day | ORAL | 0 refills | Status: DC

## 2023-02-17 MED ORDER — HYDROCORTISONE 2.5 % EX OINT
TOPICAL_OINTMENT | Freq: Two times a day (BID) | CUTANEOUS | 3 refills | Status: DC
Start: 2023-02-17 — End: 2023-04-25

## 2023-02-17 NOTE — Assessment & Plan Note (Addendum)
Due to patient's concern that rash was caused by statin medication, will D/C Crestor and start Lipitor 10 mg daily.  If she tolerates this well, can increase dosage.  Patient is very hesitant about starting new medications and very nervous about statins in general.  -Follow-up in 2 to 4 weeks to see if she is tolerating Lipitor -CMP in 3 months

## 2023-02-17 NOTE — Assessment & Plan Note (Signed)
Rash is not a common side effect of Crestor but is possible.  Can also consider bedbugs given patient was hospitalized just prior to developing this rash.  Reassuringly, no systemic symptoms and rash has not spread/worsened over the last month. -D/C Crestor and trial Lipitor -Hydrocortisone 2.5 mg ointment twice daily  -Patient advised to wash all of her bedding m and vacuum mattress well -If rash persists, can trial switching laundry detergents/soaps

## 2023-02-17 NOTE — Assessment & Plan Note (Signed)
Exam consistent with seborrheic dermatitis of scalp which patient states is itchy.  Less likely psoriasis given it is not thick/plaque-like in nature.  No erythema or inflammation of scalp. -Trial OTC Selsun Blue shampoo

## 2023-02-17 NOTE — Assessment & Plan Note (Signed)
>>  ASSESSMENT AND PLAN FOR HYPERTENSION WRITTEN ON 02/17/2023  5:19 PM BY JOSHUA, SARAH, DO  Uncontrolled with systolic slightly elevated at 857.  Patient prefers to hold off on altering blood pressure medications today which is appropriate given its only mildly elevated and she is anxious about this rash and switching statin medications. -check BP at home, record and bring to next appointment in 2 to 4 weeks

## 2023-02-17 NOTE — Assessment & Plan Note (Signed)
Uncontrolled with systolic slightly elevated at 829.  Patient prefers to hold off on altering blood pressure medications today which is appropriate given its only mildly elevated and she is anxious about this rash and switching statin medications. -check BP at home, record and bring to next appointment in 2 to 4 weeks

## 2023-02-17 NOTE — Progress Notes (Signed)
    SUBJECTIVE:   CHIEF COMPLAINT / HPI:    Rash Currently taking Crestor 5 mg daily about a month ago. 2 days after starting it she noticed an itchy rash on her stomach and back of her neck/shoulders.  Prior to this she had been hospitalized for left-sided weakness/stroke workup (negative for CVA).  Denies any fever, feeling unwell.  Rash has not changed over the last month.  HTN Took hydrochlorothiazide today   PERTINENT  PMH / PSH: HLD, HTN  OBJECTIVE:   BP (!) 142/66   Pulse 89   Ht 5\' 3"  (1.6 m)   Wt 157 lb (71.2 kg)   SpO2 99%   BMI 27.81 kg/m    General: NAD, pleasant, able to participate in exam Cardiac: RRR, no murmurs. Respiratory: Normal effort on room air Skin: dry flaking skin from top of posterior neck going into scalp past the occiput, is not thick or plaque-like.  Erythematous raised papules on upper abdomen and posterior neck/shoulders.  See photo Neuro: alert, no obvious focal deficits Psych: Normal affect and mood      ASSESSMENT/PLAN:   Hypertension Uncontrolled with systolic slightly elevated at 161.  Patient prefers to hold off on altering blood pressure medications today which is appropriate given its only mildly elevated and she is anxious about this rash and switching statin medications. -check BP at home, record and bring to next appointment in 2 to 4 weeks  Seborrheic dermatitis of scalp Exam consistent with seborrheic dermatitis of scalp which patient states is itchy.  Less likely psoriasis given it is not thick/plaque-like in nature.  No erythema or inflammation of scalp. -Trial OTC Selsun Blue shampoo  Hyperlipidemia Due to patient's concern that rash was caused by statin medication, will D/C Crestor and start Lipitor 10 mg daily.  If she tolerates this well, can increase dosage.  Patient is very hesitant about starting new medications and very nervous about statins in general.  -Follow-up in 2 to 4 weeks to see if she is tolerating  Lipitor -CMP in 3 months   Rash Rash is not a common side effect of Crestor but is possible.  Can also consider bedbugs given patient was hospitalized just prior to developing this rash.  Reassuringly, no systemic symptoms and rash has not spread/worsened over the last month. -D/C Crestor and trial Lipitor -Hydrocortisone 2.5 mg ointment twice daily  -Patient advised to wash all of her bedding m and vacuum mattress well -If rash persists, can trial switching laundry detergents/soaps   Health maintenance: Due for annual lung cancer screening, will discuss and order at next visit if appropriate  Dr. Erick Alley, DO [facility]

## 2023-02-17 NOTE — Patient Instructions (Addendum)
It was great to see you! Thank you for allowing me to participate in your care!  I recommend that you always bring your medications to each appointment as this makes it easy to ensure you are on the correct medications and helps [place] not miss when refills are needed.  Our plans for today:  -Stop taking Crestor.  Instead, we will try a very low-dose of Lipitor 10 mg daily to treat your high cholesterol.  If you tolerate this well, we will plan to increase the dosage -For rash, prescription strength hydrocortisone ointment sent to pharmacy, can apply small amount to very itchy areas twice daily for no more than 2 weeks -Check your blood pressure at home daily, return in about 2 weeks with blood pressure readings -You can buy over-the-counter Selsun Blue shampoo and use 2-3 times per week to treat dandruff.  Leave on for 5 minutes before rinsing off.  After 4 weeks, can reduce to once weekly for maintenance therapy   Take care and seek immediate care sooner if you develop any concerns.   Dr. Erick Alley, DO [facility]

## 2023-02-25 ENCOUNTER — Other Ambulatory Visit: Payer: Self-pay

## 2023-02-25 DIAGNOSIS — I771 Stricture of artery: Secondary | ICD-10-CM

## 2023-03-09 NOTE — Progress Notes (Unsigned)
 Office Note     CC:  Subclavian artery stenosis Requesting Provider:  Caro Laroche, DO  HPI: Kelly Snyder is a 78 y.o. (Aug 02, 1945) female presenting at the request of .Erick Alley, DO for incidental finding of subclavian artery stenosis.  On exam, Kelly Snyder was doing well.  A native of [place], she has lived there her entire life.  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  Kelly Snyder was recently seen in the emergency department with concerns of stroke with numbness in the left hand.  Stroke workup was negative, however there was left subclavian artery stenosis incidentally found prompting vascular surgery referral.  Kelly Snyder has a history of left wrist fracture, which healed malaligned.  She states that she has weakness in his hand since the fracture. Denies vertebrobasilar symptoms with use of the left hand and arm.  Is include aspirin Has had difficulty with statin tolerance Stopped smoking in 2020.    Past Medical History:  Diagnosis Date   Arthritis    COPD (chronic obstructive pulmonary disease) (HCC)    Pneumonia     Past Surgical History:  Procedure Laterality Date   DILATION AND CURETTAGE OF UTERUS  1970    Social History   Socioeconomic History   Marital status: Divorced    Spouse name: Not on file   Number of children: 1   Years of education: 12   Highest education level: 12th grade  Occupational History   Occupation: retired  Tobacco Use   Smoking status: Former    Current packs/day: 0.50    Average packs/day: 0.5 packs/day for 57.2 years (28.6 ttl pk-yrs)    Types: Cigarettes    Start date: 01/04/1966    Passive exposure: Past   Smokeless tobacco: Never   Tobacco comments:    quit 10/28/18  Vaping Use   Vaping status: Never Used  Substance and Sexual Activity   Alcohol use: Not Currently   Drug use: Never   Sexual activity: Not Currently  Other Topics Concern   Not on file  Social History Narrative   Health Care POA:  Emergency Contact: Jeannett Senior (son) [phone]       Patient lives with her son and grandson. Patient has dogs and cats.    Patient enjoys gardening and walking for exercise.    Patients sister has Parkinson's Disease. Patient enjoys spending time with her.          Social Drivers of Corporate investment banker Strain: Low Risk  (05/21/2022)   Overall Financial Resource Strain (CARDIA)    Difficulty of Paying Living Expenses: Not hard at all  Food Insecurity: No Food Insecurity (01/15/2023)   Hunger Vital Sign    Worried About Running Out of Food in the Last Year: Never true    Ran Out of Food in the Last Year: Never true  Transportation Needs: No Transportation Needs (01/15/2023)   PRAPARE - Administrator, Civil Service (Medical): No    Lack of Transportation (Non-Medical): No  Physical Activity: Sufficiently Active (05/21/2022)   Exercise Vital Sign    Days of Exercise per Week: 7 days    Minutes of Exercise per Session: 30 min  Stress: No Stress Concern Present (05/21/2022)   Kelly Snyder [employer] Occupational Health - Occupational Stress Questionnaire    Feeling of Stress : Not at all  Social Connections: Moderately Isolated (01/15/2023)   Social Connection and Isolation Panel [NHANES]    Frequency of Communication with Friends and  Family: Twice a week    Frequency of Social Gatherings with Friends and Family: Three times a week    Attends Religious Services: More than 4 times per year    Active Member of Clubs or Organizations: No    Attends Banker Meetings: Never    Marital Status: Divorced  Catering manager Violence: Not At Risk (01/15/2023)   Humiliation, Afraid, Rape, and Kick questionnaire    Fear of Current or Ex-Partner: No    Emotionally Abused: No    Physically Abused: No    Sexually Abused: No   Family History  Problem Relation Age of Onset   Diabetes Mother    Heart disease Mother    Heart disease Father    Parkinson's disease Sister     Dementia Sister    Alcohol abuse Son     Current Outpatient Medications  Medication Sig Dispense Refill   acetaminophen (TYLENOL) 500 MG tablet Take 1,000 mg by mouth every 6 (six) hours as needed for moderate pain (pain).      albuterol (VENTOLIN HFA) 108 (90 Base) MCG/ACT inhaler Inhale 2 puffs into the lungs every 6 (six) hours as needed for wheezing or shortness of breath.     aspirin 81 MG chewable tablet Chew 1 tablet (81 mg total) by mouth daily.     atorvastatin (LIPITOR) 10 MG tablet Take 1 tablet (10 mg total) by mouth daily. 30 tablet 0   Calcium Carb-Cholecalciferol (CALCIUM 600-D PO) Take 1 tablet by mouth daily.     hydrochlorothiazide (HYDRODIURIL) 12.5 MG tablet TAKE 1/2 TABLET BY MOUTH DAILY 45 tablet 0   hydrocortisone 2.5 % ointment Apply topically 2 (two) times daily. As needed for itching rash.  Do not use for more than 1-2 weeks at a time. 30 g 3   Lidocaine (ASPERCREME MAX STRENGTH) 4 % AERO Apply 1 Application topically daily as needed (hip arthritis pain).     Multiple Vitamin (MULTIVITAMIN WITH MINERALS) TABS tablet Take 1 tablet by mouth daily.     Tiotropium Bromide Monohydrate (SPIRIVA RESPIMAT) 2.5 MCG/ACT AERS INHALE 2 PUFFS BY MOUTH INTO THE LUNGS DAILY 4 g 3   No current facility-administered medications for this visit.    Allergies  Allergen Reactions   Prednisone Itching and Rash    Patient broke out in a full body rash   Tizanidine Itching and Rash    Patient broke out in a full body rash     REVIEW OF SYSTEMS:  [X]  denotes positive finding, [ ]  denotes negative finding Cardiac  Comments:  Chest pain or chest pressure:    Shortness of breath upon exertion:    Short of breath when lying flat:    Irregular heart rhythm:        Vascular    Pain in calf, thigh, or hip brought on by ambulation:    Pain in feet at night that wakes you up from your sleep:     Blood clot in your veins:    Leg swelling:         Pulmonary    Oxygen at home:     Productive cough:     Wheezing:         Neurologic    Sudden weakness in arms or legs:     Sudden numbness in arms or legs:     Sudden onset of difficulty speaking or slurred speech:    Temporary loss of vision in one eye:     Problems  with dizziness:         Gastrointestinal    Blood in stool:     Vomited blood:         Genitourinary    Burning when urinating:     Blood in urine:        Psychiatric    Major depression:         Hematologic    Bleeding problems:    Problems with blood clotting too easily:        Skin    Rashes or ulcers:        Constitutional    Fever or chills:      PHYSICAL EXAMINATION:  There were no vitals filed for this visit.  General:  WDWN in NAD; vital signs documented above Gait: Not observed HENT: WNL, normocephalic Pulmonary: normal non-labored breathing , without wheezing Cardiac: regular HR Abdomen: soft, NT, no masses Skin: without rashes Vascular Exam/Pulses:  Right Left  Radial 2+ (normal) 2+ (normal)                       Extremities: without ischemic changes, without Gangrene , without cellulitis; without open wounds;  Musculoskeletal: no muscle wasting or atrophy  Neurologic: A&O X 3;  No focal weakness or paresthesias are detected Psychiatric:  The pt has Normal affect.   Non-Invasive Vascular Imaging:    Summary:  Right Carotid: Velocities in the right ICA are consistent with a 1-39%  stenosis.   Left Carotid: Velocities in the left ICA are consistent with a 1-39%  stenosis.   Vertebrals: Bilateral vertebral arteries demonstrate antegrade flow.  Subclavians: Normal flow hemodynamics were seen in bilateral subclavian               arteries.     ASSESSMENT/PLAN: SHERRYE PUGA is a 78 y.o. female presenting with CT findings demonstrating left subclavian artery stenosis.    On physical exam, she had a palpable pulse in the left wrist.  Bilateral blood pressures demonstrated a 15 mmHg difference with the  left arm actually being higher which is interesting.  The CTA was independently reviewed demonstrating mild left subclavian artery stenosis.  I also reviewed bilateral carotid duplex ultrasound studies.  This demonstrated mild atherosclerotic disease bilaterally.  Antegrade vertebral flow -essentially, the study was normal.   I had a long discussion with Anique regarding the above.  I do not think that the stenosis appreciated on CTA is clinically significant, especially being that her blood pressure is actually higher in the left arm.  With mild carotid stenosis, I recommend a repeat carotid duplex study in 2 to 3 years.  I congratulated her on smoking cessation.  Should she be intolerant of all statins, recommend Repatha.  I asked that she continue taking aspirin 81 mg daily, and call should any questions or concerns arise.    Victorino Sparrow, MD [facility] [phone]

## 2023-03-10 ENCOUNTER — Ambulatory Visit (INDEPENDENT_AMBULATORY_CARE_PROVIDER_SITE_OTHER): Payer: 59 | Admitting: Vascular Surgery

## 2023-03-10 ENCOUNTER — Encounter: Payer: Self-pay | Admitting: Vascular Surgery

## 2023-03-10 ENCOUNTER — Ambulatory Visit (HOSPITAL_COMMUNITY)
Admission: RE | Admit: 2023-03-10 | Discharge: 2023-03-10 | Disposition: A | Discharge status: Home / Self Care | Payer: 59 | Source: Ambulatory Visit | Attending: Vascular Surgery | Admitting: Vascular Surgery

## 2023-03-10 VITALS — BP 129/81 | HR 81 | Temp 97.8°F | Resp 20 | Ht 63.0 in | Wt 156.0 lb

## 2023-03-10 DIAGNOSIS — I771 Stricture of artery: Secondary | ICD-10-CM | POA: Insufficient documentation

## 2023-03-10 DIAGNOSIS — I6523 Occlusion and stenosis of bilateral carotid arteries: Secondary | ICD-10-CM

## 2023-03-27 ENCOUNTER — Other Ambulatory Visit: Payer: Self-pay | Admitting: Student

## 2023-03-27 DIAGNOSIS — I10 Essential (primary) hypertension: Secondary | ICD-10-CM

## 2023-04-11 ENCOUNTER — Encounter: Payer: Self-pay | Admitting: Student

## 2023-04-11 ENCOUNTER — Ambulatory Visit (INDEPENDENT_AMBULATORY_CARE_PROVIDER_SITE_OTHER): Payer: 59 | Admitting: Student

## 2023-04-11 VITALS — BP 143/63 | HR 102 | Temp 98.0°F | Ht 63.0 in | Wt 156.2 lb

## 2023-04-11 DIAGNOSIS — Z87891 Personal history of nicotine dependence: Secondary | ICD-10-CM | POA: Diagnosis not present

## 2023-04-11 DIAGNOSIS — I10 Essential (primary) hypertension: Secondary | ICD-10-CM | POA: Diagnosis not present

## 2023-04-11 DIAGNOSIS — J441 Chronic obstructive pulmonary disease with (acute) exacerbation: Secondary | ICD-10-CM | POA: Insufficient documentation

## 2023-04-11 MED ORDER — PREDNISONE 20 MG PO TABS: 40.0000 mg | ORAL_TABLET | Freq: Every day | ORAL | 0 refills | Status: DC

## 2023-04-11 MED ORDER — AZITHROMYCIN 500 MG PO TABS: 500.0000 mg | ORAL_TABLET | Freq: Every day | ORAL | 0 refills | Status: DC

## 2023-04-11 NOTE — Patient Instructions (Addendum)
 It was great to see you! Thank you for allowing me to participate in your care!   Our plans for today:  - Take Azithromycin for 3 days and prednisone for 5 days  - Schedule follow up apt with me for 1 week - If you are not feeling better by Wednesday, call the office to get an appointment before the end of the week - If your shortness of breath worsens significantly, go to the ED    Take care and seek immediate care sooner if you develop any concerns.   Dr. Erick Alley, DO [facility]

## 2023-04-11 NOTE — Assessment & Plan Note (Addendum)
 Slightly uncontrolled in setting of acute illness. - continue hydrochlorothiazide - Will recheck at next visit.

## 2023-04-11 NOTE — Assessment & Plan Note (Signed)
 Increased sputum production, wheezing, and dyspnea suggest exacerbation possibly 2/2 viral infection. SPO2 is appropriate and she is breathing comfortably on RA.  Has prednisone listed as allergy but she has tolerated it well before. She thinks previous allergic reaction may have been to tizanidine and not prednisone and they were given at the same time.  - Prescribe azithromycin 500 mg for 3 days. - Prescribe prednisone 40 mg for 5 days - Continue Spiriva and albuterol inhalers. - Schedule follow-up in one week. - Advise to seek medical attention if symptoms worsen.

## 2023-04-11 NOTE — Progress Notes (Signed)
    SUBJECTIVE:   CHIEF COMPLAINT / HPI:   The patient, with a known history of COPD, presents with a week-long history of increased cough and sputum production. The sputum has changed in color to yellow, which is different from her baseline. The patient also reports increased shortness of breath, particularly when walking long distances. She has been using her albuterol inhaler more frequently due to this. The patient denies any fever but reports body aches and chills that lasted for a couple of days. She also mentions a change in her voice, which was initially lost, then became cracky, and is now returning to normal. The patient has been maintaining hydration and has not experienced any weight loss. She reports chest pain only when coughing excessively.  PERTINENT  PMH / PSH: HTN, COPD  OBJECTIVE:   BP (!) 143/63   Pulse (!) 102   Temp 98 F (36.7 C)   Ht 5\' 3"  (1.6 m)   Wt 156 lb 3.2 oz (70.9 kg)   SpO2 91%   BMI 27.67 kg/m    General: NAD, pleasant, well appearing  Cardiac: slightly tachycardic, regular rhythm, no murmur Respiratory: good air movement, very mild Intermittent expiratory wheezing and very mild faint crackles throughout lungs. Normal effort.  Extremities: no edema BLEs Skin: warm and dry Neuro: alert, no obvious focal deficits Psych: Normal affect and mood  ASSESSMENT/PLAN:   COPD exacerbation (HCC) Increased sputum production, wheezing, and dyspnea suggest exacerbation possibly 2/2 viral infection. SPO2 is appropriate and she is breathing comfortably on RA.  Has prednisone listed as allergy but she has tolerated it well before. She thinks previous allergic reaction may have been to tizanidine and not prednisone and they were given at the same time.  - Prescribe azithromycin 500 mg for 3 days. - Prescribe prednisone 40 mg for 5 days - Continue Spiriva and albuterol inhalers. - Schedule follow-up in one week. - Advise to seek medical attention if symptoms  worsen.   HTN (hypertension) Slightly uncontrolled in setting of acute illness. - continue hydrochlorothiazide - Will recheck at next visit.    Health Maintenance: Annual low dose chest CT scan ordered d/t smoking history    Dr. Erick Alley, DO  [facility]

## 2023-04-14 ENCOUNTER — Ambulatory Visit

## 2023-04-14 VITALS — Ht 63.0 in | Wt 156.0 lb

## 2023-04-14 DIAGNOSIS — Z0000 Encounter for general adult medical examination without abnormal findings: Secondary | ICD-10-CM

## 2023-04-14 NOTE — Progress Notes (Signed)
 Because this visit was a virtual/telehealth visit,  certain criteria was not obtained, such a blood pressure, CBG if applicable, and timed get up and go. Any medications not marked as "taking" were not mentioned during the medication reconciliation part of the visit. Any vitals not documented were not able to be obtained due to this being a telehealth visit or patient was unable to self-report a recent blood pressure reading due to a lack of equipment at home via telehealth. Vitals that have been documented are verbally provided by the patient.   Subjective:   Kelly Snyder is a 78 y.o. who presents for a Medicare Wellness preventive visit.  Visit Complete: Virtual I connected with  Kelly Snyder on 04/14/23 by a audio enabled telemedicine application and verified that I am speaking with the correct person using two identifiers.  Patient Location: Home  Provider Location: Office/Clinic  I discussed the limitations of evaluation and management by telemedicine. The patient expressed understanding and agreed to proceed.  Vital Signs: Because this visit was a virtual/telehealth visit, some criteria may be missing or patient reported. Any vitals not documented were not able to be obtained and vitals that have been documented are patient reported.  VideoDeclined- This patient declined Librarian, academic. Therefore the visit was completed with audio only.  Persons Participating in Visit: Patient.  AWV Questionnaire: No: Patient Medicare AWV questionnaire was not completed prior to this visit.  Cardiac Risk Factors include: advanced age (>71men, >44 women);dyslipidemia;family history of premature cardiovascular disease;hypertension;sedentary lifestyle     Objective:    Today's Vitals   04/14/23 1151  Weight: 156 lb (70.8 kg)  Height: 5\' 3"  (1.6 m)  PainSc: 0-No pain   Body mass index is 27.63 kg/m.     04/14/2023   11:55 AM 04/11/2023    8:41 AM  02/17/2023    4:24 PM 01/18/2023    9:38 AM 01/15/2023    3:31 AM 01/14/2023    8:44 PM 05/21/2022    9:26 AM  Advanced Directives  Does Patient Have a Medical Advance Directive? No No No No No No Yes  Would patient like information on creating a medical advance directive? No - Patient declined No - Patient declined No - Patient declined No - Patient declined No - Patient declined      Current Medications (verified) Outpatient Encounter Medications as of 04/14/2023  Medication Sig   acetaminophen (TYLENOL) 500 MG tablet Take 1,000 mg by mouth every 6 (six) hours as needed for moderate pain (pain).    albuterol (VENTOLIN HFA) 108 (90 Base) MCG/ACT inhaler Inhale 2 puffs into the lungs every 6 (six) hours as needed for wheezing or shortness of breath.   aspirin 81 MG chewable tablet Chew 1 tablet (81 mg total) by mouth daily.   azithromycin (ZITHROMAX) 500 MG tablet Take 1 tablet (500 mg total) by mouth daily.   Calcium Carb-Cholecalciferol (CALCIUM 600-D PO) Take 1 tablet by mouth daily.   hydrochlorothiazide (HYDRODIURIL) 12.5 MG tablet TAKE 1/2 TABLET BY MOUTH DAILY   hydrocortisone 2.5 % ointment Apply topically 2 (two) times daily. As needed for itching rash.  Do not use for more than 1-2 weeks at a time.   Lidocaine (ASPERCREME MAX STRENGTH) 4 % AERO Apply 1 Application topically daily as needed (hip arthritis pain).   Multiple Vitamin (MULTIVITAMIN WITH MINERALS) TABS tablet Take 1 tablet by mouth daily.   predniSONE (DELTASONE) 20 MG tablet Take 2 tablets (40 mg total) by  mouth daily with breakfast.   Tiotropium Bromide Monohydrate (SPIRIVA RESPIMAT) 2.5 MCG/ACT AERS INHALE 2 PUFFS BY MOUTH INTO THE LUNGS DAILY   No facility-administered encounter medications on file as of 04/14/2023.    Allergies (verified) Prednisone, Tizanidine, Lipitor [atorvastatin], and Crestor [rosuvastatin]   History: Past Medical History:  Diagnosis Date   Arthritis    COPD (chronic obstructive pulmonary  disease) (HCC)    Pneumonia    Past Surgical History:  Procedure Laterality Date   DILATION AND CURETTAGE OF UTERUS  1970   Family History  Problem Relation Age of Onset   Diabetes Mother    Heart disease Mother    Heart disease Father    Parkinson's disease Sister    Dementia Sister    Alcohol abuse Son    Social History   Socioeconomic History   Marital status: Divorced    Spouse name: Not on file   Number of children: 1   Years of education: 12   Highest education level: 12th grade  Occupational History   Occupation: retired  Tobacco Use   Smoking status: Former    Current packs/day: 0.50    Average packs/day: 0.5 packs/day for 57.3 years (28.6 ttl pk-yrs)    Types: Cigarettes    Start date: 01/04/1966    Passive exposure: Past   Smokeless tobacco: Never   Tobacco comments:    quit 10/28/2018  Vaping Use   Vaping status: Never Used  Substance and Sexual Activity   Alcohol use: Not Currently   Drug use: Never   Sexual activity: Not Currently  Other Topics Concern   Not on file  Social History Narrative   Health Care POA: Emergency Contact: Jeannett Senior (son) [phone]       Patient lives with her son and grandson. Patient has dogs and cats.    Patient enjoys gardening and walking for exercise.    Patients sister has Parkinson's Disease. Patient enjoys spending time with her.          Social Drivers of Corporate investment banker Strain: Low Risk  (04/14/2023)   Overall Financial Resource Strain (CARDIA)    Difficulty of Paying Living Expenses: Not hard at all  Food Insecurity: No Food Insecurity (04/14/2023)   Hunger Vital Sign    Worried About Running Out of Food in the Last Year: Never true    Ran Out of Food in the Last Year: Never true  Transportation Needs: No Transportation Needs (04/14/2023)   PRAPARE - Administrator, Civil Service (Medical): No    Lack of Transportation (Non-Medical): No  Physical Activity: Sufficiently Active (04/14/2023)    Exercise Vital Sign    Days of Exercise per Week: 7 days    Minutes of Exercise per Session: 30 min  Stress: No Stress Concern Present (04/14/2023)   [employer] of Occupational Health - Occupational Stress Questionnaire    Feeling of Stress : Not at all  Social Connections: Moderately Isolated (04/14/2023)   Social Connection and Isolation Panel [NHANES]    Frequency of Communication with Friends and Family: Twice a week    Frequency of Social Gatherings with Friends and Family: Three times a week    Attends Religious Services: More than 4 times per year    Active Member of Clubs or Organizations: No    Attends Banker Meetings: Never    Marital Status: Divorced    Tobacco Counseling Counseling given: Not Answered Tobacco comments: quit 10/28/2018  Clinical Intake:  Pre-visit preparation completed: Yes  Pain : No/denies pain Pain Score: 0-No pain     BMI - recorded: 27.63 Nutritional Status: BMI 25 -29 Overweight Nutritional Risks: None Diabetes: No  Lab Results  Component Value Date   HGBA1C 5.2 01/15/2023   HGBA1C 4.9 01/15/2019     How often do you need to have someone help you when you read instructions, pamphlets, or other written materials from your doctor or pharmacy?: 1 - Never What is the last grade level you completed in school?: HSG  Interpreter Needed?: No  Information entered by :: Johnette Teigen N. Marston Mccadden, LPN.   Activities of Daily Living     04/14/2023   11:55 AM 01/15/2023    3:31 AM  In your present state of health, do you have any difficulty performing the following activities:  Hearing? 0 1  Vision? 0 0  Difficulty concentrating or making decisions? 0 0  Walking or climbing stairs? 0   Dressing or bathing? 0   Doing errands, shopping? 0 0  Preparing Food and eating ? N   Using the Toilet? N   In the past six months, have you accidently leaked urine? N   Do you have problems with loss of bowel control? N   Managing  your Medications? N   Managing your Finances? N   Housekeeping or managing your Housekeeping? N     Patient Care Team: Erick Alley, DO as PCP - General (Family Medicine) Coletta Memos, MD as Consulting Physician (Neurosurgery) Manning Charity, OD as Referring Physician (Optometry) Vedia Coffer, Anson Fret, AUD as Audiologist (Audiology)  Indicate any recent Medical Services you may have received from other than [facility] providers in the past year (date may be approximate).     Assessment:   This is a routine wellness examination for Kady.  Hearing/Vision screen Hearing Screening - Comments:: Denies hearing difficulties.  Vision Screening - Comments:: Wears reading glasses - up to date with routine eye exams with Manning Charity, OD.    Goals Addressed             This Visit's Progress    04/14/2023: LOSE 30 POUNDS OF WEIGHT AND STAY HEALTHY.         Depression Screen     04/14/2023   11:57 AM 04/11/2023    8:41 AM 02/17/2023    4:23 PM 01/18/2023    9:38 AM 07/06/2022    2:45 PM 06/16/2022    9:16 AM 05/21/2022   12:42 PM  PHQ 2/9 Scores  PHQ - 2 Score 0  0 0 0 0 0  PHQ- 9 Score 0  0 0 0 0   Exception Documentation  Patient refusal         Fall Risk     04/14/2023   11:55 AM 04/11/2023    8:41 AM 02/17/2023    4:23 PM 01/18/2023    9:39 AM 07/06/2022    2:45 PM  Fall Risk   Falls in the past year? 0 0 0 0 0  Number falls in past yr: 0 0 0 0 0  Injury with Fall? 0 0 0 0 0  Risk for fall due to : No Fall Risks  No Fall Risks    Follow up Falls prevention discussed;Falls evaluation completed  Falls evaluation completed      MEDICARE RISK AT HOME:  Medicare Risk at Home Any stairs in or around the home?: No If so, are there any without handrails?: No Home free  of loose throw rugs in walkways, pet beds, electrical cords, etc?: Yes Adequate lighting in your home to reduce risk of falls?: Yes Life alert?: No Use of a cane, walker or w/c?: No Grab bars in the bathroom?: Yes Shower  chair or bench in shower?: No Elevated toilet seat or a handicapped toilet?: No  TIMED UP AND GO:  Was the test performed?  No  Cognitive Function: 6CIT completed    04/14/2023   11:57 AM 06/19/2013    3:00 PM 06/19/2013   11:00 AM  MMSE - Mini Mental State Exam  Not completed: Unable to complete    Orientation to time  4 5  Orientation to Place  5 4  Registration  3 3  Attention/ Calculation  5 5  Recall  2 2  Language- name 2 objects  2 2  Language- repeat  1 1  Language- follow 3 step command  3 3  Language- read & follow direction  1 1  Write a sentence  1 1  Copy design  1 1  Total score  28 28        04/14/2023   11:53 AM 05/21/2022    9:26 AM 04/28/2021    1:24 PM  6CIT Screen  What Year? 0 points 0 points 0 points  What month? 0 points 0 points 0 points  What time? 0 points 0 points 0 points  Count back from 20 0 points 0 points 0 points  Months in reverse 0 points 0 points 0 points  Repeat phrase 0 points 0 points 0 points  Total Score 0 points 0 points 0 points    Immunizations Immunization History  Administered Date(s) Administered   Tdap 10/21/2015    Screening Tests Health Maintenance  Topic Date Due   COVID-19 Vaccine (1) Never done   Lung Cancer Screening  02/03/2023   Zoster Vaccines- Shingrix (1 of 2) 05/17/2023 (Originally 05/12/1964)   Pneumonia Vaccine 21+ Years old (1 of 2 - PCV) 05/21/2023 (Originally 05/12/1964)   INFLUENZA VACCINE  08/05/2023   Medicare Annual Wellness (AWV)  04/13/2024   DTaP/Tdap/Td (2 - Td or Tdap) 10/20/2025   DEXA SCAN  Completed   Hepatitis C Screening  Completed   HPV VACCINES  Aged Out   Meningococcal B Vaccine  Aged Out   Fecal DNA (Cologuard)  Discontinued    Health Maintenance  Health Maintenance Due  Topic Date Due   COVID-19 Vaccine (1) Never done   Lung Cancer Screening  02/03/2023   Health Maintenance Items Addressed: Yes Patient is due for Lung Cancer Screening.  Additional Screening:  Vision  Screening: Recommended annual ophthalmology exams for early detection of glaucoma and other disorders of the eye.  Dental Screening: Recommended annual dental exams for proper oral hygiene  Community Resource Referral / Chronic Care Management: CRR required this visit?  No   CCM required this visit?  No     Plan:     I have personally reviewed and noted the following in the patient's chart:   Medical and social history Use of alcohol, tobacco or illicit drugs  Current medications and supplements including opioid prescriptions. Patient is not currently taking opioid prescriptions. Functional ability and status Nutritional status Physical activity Advanced directives List of other physicians Hospitalizations, surgeries, and ER visits in previous 12 months Vitals Screenings to include cognitive, depression, and falls Referrals and appointments  In addition, I have reviewed and discussed with patient certain preventive protocols, quality metrics, and best  practice recommendations. A written personalized care plan for preventive services as well as general preventive health recommendations were provided to patient.     Mickeal Needy, LPN   04/12/8117   After Visit Summary: (MyChart) Due to this being a telephonic visit, the after visit summary with patients personalized plan was offered to patient via MyChart   Notes: Please refer to Routing Comments.

## 2023-04-14 NOTE — Patient Instructions (Signed)
 Kelly Snyder , Thank you for taking time to come for your Medicare Wellness Visit. I appreciate your ongoing commitment to your health goals. Please review the following plan we discussed and let me know if I can assist you in the future.   Referrals/Orders/Follow-Ups/Clinician Recommendations: Keep maintaining your health by keeping your appointments with Dr. Erick Alley and any specialists that you may see.  Call us if you need anything.  Have a great year!!!!  This is a list of the screening recommended for you and due dates:  Health Maintenance  Topic Date Due   COVID-19 Vaccine (1) Never done   Screening for Lung Cancer  02/03/2023   Zoster (Shingles) Vaccine (1 of 2) 05/17/2023*   Pneumonia Vaccine (1 of 2 - PCV) 05/21/2023*   Flu Shot  08/05/2023   Medicare Annual Wellness Visit  04/13/2024   DTaP/Tdap/Td vaccine (2 - Td or Tdap) 10/20/2025   DEXA scan (bone density measurement)  Completed   Hepatitis C Screening  Completed   HPV Vaccine  Aged Out   Meningitis B Vaccine  Aged Out   Cologuard (Stool DNA test)  Discontinued  *Topic was postponed. The date shown is not the original due date.    Advanced directives: (Declined) Advance directive discussed with you today. Even though you declined this today, please call our office should you change your mind, and we can give you the proper paperwork for you to fill out.  Next Medicare Annual Wellness Visit scheduled for next year: Yes

## 2023-04-25 ENCOUNTER — Ambulatory Visit (INDEPENDENT_AMBULATORY_CARE_PROVIDER_SITE_OTHER): Admitting: Student

## 2023-04-25 ENCOUNTER — Encounter: Payer: Self-pay | Admitting: Student

## 2023-04-25 VITALS — BP 143/70 | HR 88 | Ht 63.0 in | Wt 155.4 lb

## 2023-04-25 DIAGNOSIS — E785 Hyperlipidemia, unspecified: Secondary | ICD-10-CM

## 2023-04-25 DIAGNOSIS — I10 Essential (primary) hypertension: Secondary | ICD-10-CM

## 2023-04-25 NOTE — Assessment & Plan Note (Signed)
 Intolerance to high-intensity statins.  I recommended trialing pravastatin today however patient declines and prefers to focus on lifestyle modification and rechecking a lipid panel in several months.  We also discussed option of Repatha which patient declines.

## 2023-04-25 NOTE — Progress Notes (Signed)
    SUBJECTIVE:   CHIEF COMPLAINT / HPI:   The patient, with a history of COPD, high blood pressure, and aortic atherosclerosis, presents for a follow-up visit. She reports improvement in her COPD symptoms after taking prednisone  and azithromycin , with the medication working within a few days.  She states she is feeling well today.  The patient also reports noticing a few  bruises, one on her arm and one on each leg, but denies any trauma or injury. She expresses concern about these spots and asks about potential causes.   The patient also discusses her efforts to exercise and improve her diet to manage her cholesterol levels. She expresses reluctance to try another statin medication due to previous experiences with muscle weakness when she tried Crestor  and Lipitor.   PERTINENT  PMH / PSH: HTN, aortic atheroclerosis, COPD  OBJECTIVE:   BP (!) 143/70   Pulse 88   Ht 5\' 3"  (1.6 m)   Wt 155 lb 6.4 oz (70.5 kg)   SpO2 93%   BMI 27.53 kg/m    General: NAD, pleasant, able to participate in exam Cardiac: RRR, no murmurs. Respiratory: CTAB, normal effort, No wheezes, rales or rhonchi Skin: Small (~1x1 cm) Blue ecchymoses on left forearm Neuro: alert, no obvious focal deficits Psych: Normal affect and mood  ASSESSMENT/PLAN:   Hyperlipidemia Intolerance to high-intensity statins.  I recommended trialing pravastatin today however patient declines and prefers to focus on lifestyle modification and rechecking a lipid panel in several months.  We also discussed option of Repatha which patient declines.    HTN (hypertension) Blood pressure 143/70, acceptable for age. Current medication is hydrochlorothiazide  12.5 mg daily. - Continue hydrochlorothiazide  12.5 mg daily. - Discussed option of ambulatory blood pressure monitoring however patient declines. -She plans to get home cuff, check readings once daily and bring to her next appointment to ensure its accuracy   Health  maintenance Low-dose CT scan for lung cancer screening ordered at previous visit and was scheduled today  Dr. Glenn Lange, DO  [facility]

## 2023-04-25 NOTE — Patient Instructions (Signed)
 It was great to see you! Thank you for allowing me to participate in your care!    Our plans for today:  - Go to apt for lung cancer screening CT - continue current medications - can check cholesterol after several months of healthy lifestyle changes and discuss trying pravistatin with your next doctor if you want to    Take care and seek immediate care sooner if you develop any concerns.   Dr. Glenn Lange, DO [facility]

## 2023-04-25 NOTE — Assessment & Plan Note (Signed)
 Blood pressure 143/70, acceptable for age. Current medication is hydrochlorothiazide  12.5 mg daily. - Continue hydrochlorothiazide  12.5 mg daily. - Discussed option of ambulatory blood pressure monitoring however patient declines. -She plans to get home cuff, check readings once daily and bring to her next appointment to ensure its accuracy

## 2023-05-04 ENCOUNTER — Ambulatory Visit (HOSPITAL_COMMUNITY)
Admission: RE | Admit: 2023-05-04 | Discharge: 2023-05-04 | Disposition: A | Discharge status: Home / Self Care | Source: Ambulatory Visit | Attending: Family Medicine | Admitting: Family Medicine

## 2023-05-04 DIAGNOSIS — Z87891 Personal history of nicotine dependence: Secondary | ICD-10-CM | POA: Diagnosis not present

## 2023-05-30 ENCOUNTER — Ambulatory Visit: Payer: Self-pay | Admitting: Student

## 2023-06-10 ENCOUNTER — Other Ambulatory Visit: Payer: Self-pay | Admitting: Student

## 2023-06-10 DIAGNOSIS — J439 Emphysema, unspecified: Secondary | ICD-10-CM

## 2023-06-14 ENCOUNTER — Encounter: Payer: Self-pay | Admitting: *Deleted

## 2023-06-29 ENCOUNTER — Other Ambulatory Visit: Payer: Self-pay | Admitting: Student

## 2023-06-29 DIAGNOSIS — I10 Essential (primary) hypertension: Secondary | ICD-10-CM

## 2023-08-30 ENCOUNTER — Ambulatory Visit (INDEPENDENT_AMBULATORY_CARE_PROVIDER_SITE_OTHER): Admitting: Family Medicine

## 2023-08-30 VITALS — BP 139/66 | HR 76 | Wt 144.8 lb

## 2023-08-30 DIAGNOSIS — E663 Overweight: Secondary | ICD-10-CM

## 2023-08-30 DIAGNOSIS — I10 Essential (primary) hypertension: Secondary | ICD-10-CM | POA: Diagnosis not present

## 2023-08-30 DIAGNOSIS — R39198 Other difficulties with micturition: Secondary | ICD-10-CM | POA: Diagnosis not present

## 2023-08-30 DIAGNOSIS — L819 Disorder of pigmentation, unspecified: Secondary | ICD-10-CM

## 2023-08-30 NOTE — Patient Instructions (Signed)
 It was wonderful to see you today.  Please bring ALL of your medications with you to every visit.   Today we talked about:  Moles - I will put in a referral for our dermatology clinic here. They will call you to schedule.   For your urinary issues, I believe this could be due to a weak pelvic floor. I have put in a referral for pelvic floor therapy. We can also try something to insert in the vagina to help with support called a pessary or eventually try medication.   Please continue to do resistance or weight bearing exercises to help with your bones and muscles.   Please follow up in 3 months   Thank you for choosing [facility].   Please call [phone] with any questions about today's appointment.  Please be sure to schedule follow up at the front desk before you leave today.   Areta Saliva, MD  Family Medicine

## 2023-08-30 NOTE — Progress Notes (Signed)
    SUBJECTIVE:   CHIEF COMPLAINT / HPI:   Weight  Patient has been trying to lose weight. She says she has been trying to watch what she eats. She has been wanting to lose weight as she says she used to be 110 a decade ago and feels like she has felt worse since she has gained weight after covid. For exercise patient says that she, goes walking; however, not consistently. She does endorse being counseled previously on weight bearing or resistance exercises; however, is not that interested.   HTN  Patient has been taking her blood pressure medication daily. Denies adverse side effects.   Moles  Patient says that she has had multiple moles on her back for years and is unaware of any change. Says they are occasionally itchy, but not all of them are.   Urinary issues  Patient says that for the past couple weeks she has been having urinary urgency, but when she gets to the bathroom sometimes it takes a while for her to actually be able to void. The urinary hesitancy is only on occasion. Denies dysuria. Denies blood in urine. Denies using any over the counter medications. PERTINENT  PMH / PSH: HTN, Aortic atherosclerosis, Subclavian artery stenosis   OBJECTIVE:   BP 139/66   Pulse 76   Wt 144 lb 12.8 oz (65.7 kg)   SpO2 98%   BMI 25.65 kg/m   General: well appearing, in no acute distress CV: Well perfused  Resp: Normal work of breathing on room air, Abd: Soft, non tender, non distended, no suprapubic tenderness  GU (chaperoned by CMA): anterior wall of vagina weak with mild prolapse. Does not extend out of the vagina.  Skin: 7 hyperpigmented raised macules on back with dark brown to light brown appearance.  Pictures in media tab   ASSESSMENT/PLAN:   Assessment & Plan Overweight (BMI 25.0-29.9) Patient attempting to change lifestyle to lose weight. Has been making some great nutrition changes.  - Counseled patient to emphasize strength based exercises to maintain as much muscle as  possible with weight loss and also help with her osteoporosis  - Discussed not making any drastic cuts in amount she eats to prevent weight loss that is too fast given her age and osteoporosis Hypertension, unspecified type Controlled.  - Continue hydrochlorothiazide  6.25 Pigmented skin lesions Multiple pigmented skin lesions on the back appear to be seborrheic keratoses. However, patient is concerned regarding these and other moles and would like formal visit in [place] [facility] derm clinic.  - Will message to ask if patient can be seen in [facility] derm clinic  Urinary dysfunction Most likely some urge incontinence with urinary hesitancy due to weak pelvic floor causing some urethral dysfunction.  No medications on her list that would cause urinary retention. Unlikely UTI given no dysuria.  - Pelvic floor physical therapy  - Discussed pessary for vaginal wall support. Patient wanted to hold off and wait until after physical therapy.  - Follow up in 3 months      Areta Saliva, MD [facility]

## 2023-08-30 NOTE — Assessment & Plan Note (Signed)
 Controlled.  - Continue hydrochlorothiazide  6.25

## 2023-08-30 NOTE — Assessment & Plan Note (Signed)
>>  ASSESSMENT AND PLAN FOR HYPERTENSION WRITTEN ON 08/30/2023  4:30 PM BY BOYINA, AKHILA, MD  Controlled.  - Continue hydrochlorothiazide  6.25

## 2023-09-12 ENCOUNTER — Other Ambulatory Visit: Payer: Self-pay

## 2023-09-12 ENCOUNTER — Inpatient Hospital Stay (HOSPITAL_COMMUNITY)
Admission: EM | Admit: 2023-09-12 | Discharge: 2023-09-15 | DRG: 287 | Disposition: A | Discharge status: Home / Self Care | Attending: Family Medicine | Admitting: Family Medicine

## 2023-09-12 DIAGNOSIS — I214 Non-ST elevation (NSTEMI) myocardial infarction: Secondary | ICD-10-CM | POA: Diagnosis not present

## 2023-09-12 DIAGNOSIS — I10 Essential (primary) hypertension: Secondary | ICD-10-CM | POA: Diagnosis present

## 2023-09-12 DIAGNOSIS — M1990 Unspecified osteoarthritis, unspecified site: Secondary | ICD-10-CM | POA: Diagnosis not present

## 2023-09-12 DIAGNOSIS — Z833 Family history of diabetes mellitus: Secondary | ICD-10-CM | POA: Diagnosis not present

## 2023-09-12 DIAGNOSIS — Z743 Need for continuous supervision: Secondary | ICD-10-CM | POA: Diagnosis not present

## 2023-09-12 DIAGNOSIS — J449 Chronic obstructive pulmonary disease, unspecified: Secondary | ICD-10-CM | POA: Diagnosis not present

## 2023-09-12 DIAGNOSIS — R0602 Shortness of breath: Secondary | ICD-10-CM | POA: Diagnosis not present

## 2023-09-12 DIAGNOSIS — E785 Hyperlipidemia, unspecified: Secondary | ICD-10-CM | POA: Diagnosis not present

## 2023-09-12 DIAGNOSIS — Z820 Family history of epilepsy and other diseases of the nervous system: Secondary | ICD-10-CM

## 2023-09-12 DIAGNOSIS — Z888 Allergy status to other drugs, medicaments and biological substances status: Secondary | ICD-10-CM | POA: Diagnosis not present

## 2023-09-12 DIAGNOSIS — I5181 Takotsubo syndrome: Secondary | ICD-10-CM | POA: Diagnosis not present

## 2023-09-12 DIAGNOSIS — F17210 Nicotine dependence, cigarettes, uncomplicated: Secondary | ICD-10-CM | POA: Diagnosis present

## 2023-09-12 DIAGNOSIS — I2510 Atherosclerotic heart disease of native coronary artery without angina pectoris: Secondary | ICD-10-CM | POA: Diagnosis not present

## 2023-09-12 DIAGNOSIS — E782 Mixed hyperlipidemia: Secondary | ICD-10-CM

## 2023-09-12 DIAGNOSIS — I2722 Pulmonary hypertension due to left heart disease: Secondary | ICD-10-CM | POA: Diagnosis not present

## 2023-09-12 DIAGNOSIS — Z8249 Family history of ischemic heart disease and other diseases of the circulatory system: Secondary | ICD-10-CM

## 2023-09-12 DIAGNOSIS — R112 Nausea with vomiting, unspecified: Secondary | ICD-10-CM

## 2023-09-12 DIAGNOSIS — R0789 Other chest pain: Secondary | ICD-10-CM | POA: Diagnosis not present

## 2023-09-12 DIAGNOSIS — Z79899 Other long term (current) drug therapy: Secondary | ICD-10-CM | POA: Diagnosis not present

## 2023-09-12 DIAGNOSIS — Z789 Other specified health status: Secondary | ICD-10-CM

## 2023-09-12 DIAGNOSIS — Z7982 Long term (current) use of aspirin: Secondary | ICD-10-CM

## 2023-09-12 DIAGNOSIS — R0600 Dyspnea, unspecified: Secondary | ICD-10-CM

## 2023-09-12 DIAGNOSIS — Z8701 Personal history of pneumonia (recurrent): Secondary | ICD-10-CM

## 2023-09-12 DIAGNOSIS — I708 Atherosclerosis of other arteries: Secondary | ICD-10-CM | POA: Diagnosis not present

## 2023-09-12 DIAGNOSIS — I249 Acute ischemic heart disease, unspecified: Secondary | ICD-10-CM | POA: Insufficient documentation

## 2023-09-12 DIAGNOSIS — R079 Chest pain, unspecified: Secondary | ICD-10-CM | POA: Diagnosis not present

## 2023-09-12 DIAGNOSIS — M810 Age-related osteoporosis without current pathological fracture: Secondary | ICD-10-CM | POA: Diagnosis not present

## 2023-09-12 DIAGNOSIS — I959 Hypotension, unspecified: Secondary | ICD-10-CM | POA: Diagnosis not present

## 2023-09-12 DIAGNOSIS — Z811 Family history of alcohol abuse and dependence: Secondary | ICD-10-CM

## 2023-09-12 DIAGNOSIS — R1111 Vomiting without nausea: Secondary | ICD-10-CM | POA: Diagnosis not present

## 2023-09-12 LAB — TROPONIN I (HIGH SENSITIVITY): Troponin I (High Sensitivity): 594 ng/L (ref <18)

## 2023-09-12 LAB — CBC
HCT: 36.3 % (ref 36.0–46.0)
Hemoglobin: 12.1 g/dL (ref 12.0–15.0)
MCH: 30.6 pg (ref 26.0–34.0)
MCHC: 33.3 g/dL (ref 30.0–36.0)
MCV: 91.7 fL (ref 80.0–100.0)
Platelets: 293 K/uL (ref 150–400)
RBC: 3.96 MIL/uL (ref 3.87–5.11)
RDW: 12.6 % (ref 11.5–15.5)
WBC: 11.1 K/uL — ABNORMAL HIGH (ref 4.0–10.5)
nRBC: 0 % (ref 0.0–0.2)

## 2023-09-12 LAB — COMPREHENSIVE METABOLIC PANEL WITH GFR
ALT: 16 U/L (ref 0–44)
AST: 24 U/L (ref 15–41)
Albumin: 3.9 g/dL (ref 3.5–5.0)
Alkaline Phosphatase: 85 U/L (ref 38–126)
Anion gap: 13 (ref 5–15)
BUN: 8 mg/dL (ref 8–23)
CO2: 27 mmol/L (ref 22–32)
Calcium: 8.9 mg/dL (ref 8.9–10.3)
Chloride: 94 mmol/L — ABNORMAL LOW (ref 98–111)
Creatinine, Ser: 0.72 mg/dL (ref 0.44–1.00)
GFR, Estimated: >60 mL/min (ref >60)
Glucose, Bld: 132 mg/dL — ABNORMAL HIGH (ref 70–99)
Potassium: 3.3 mmol/L — ABNORMAL LOW (ref 3.5–5.1)
Sodium: 134 mmol/L — ABNORMAL LOW (ref 135–145)
Total Bilirubin: 0.6 mg/dL (ref 0.0–1.2)
Total Protein: 6.8 g/dL (ref 6.5–8.1)

## 2023-09-12 MED ORDER — HEPARIN (PORCINE) 25000 UT/250ML-% IV SOLN
800.0000 [IU]/h | INTRAVENOUS | Status: DC
  Administered 2023-09-12: 800 [IU]/h via INTRAVENOUS
  Filled 2023-09-12: qty 250

## 2023-09-12 MED ORDER — LACTATED RINGERS IV BOLUS
500.0000 mL | Freq: Once | INTRAVENOUS | Status: AC
  Administered 2023-09-12: 500 mL via INTRAVENOUS

## 2023-09-12 MED ORDER — ASPIRIN 81 MG PO CHEW
324.0000 mg | CHEWABLE_TABLET | Freq: Once | ORAL | Status: AC
  Administered 2023-09-12: 324 mg via ORAL
  Filled 2023-09-12: qty 4

## 2023-09-12 MED ORDER — HEPARIN BOLUS VIA INFUSION
3800.0000 [IU] | Freq: Once | INTRAVENOUS | Status: AC
  Administered 2023-09-12: 3800 [IU] via INTRAVENOUS
  Filled 2023-09-12: qty 3800

## 2023-09-12 NOTE — Consult Note (Addendum)
 Cardiology Consultation   Patient ID: Kelly Snyder MRN: [id number]; DOB: 1945/03/11  Admit date: 09/12/2023 Date of Consult: 09/12/2023  PCP:  Lonnie Earnest, MD   [facility] Providers Cardiologist:  None    Patient Profile: Kelly Snyder is a 78 y.o. female with a hx of hyperlipidemia, COPD, hypertension who is being seen 09/12/2023 for the evaluation of chest pain at the request of [facility].  History of Present Illness: [name] presented to the ED with mainly nausea and   She states that at 4pm on Monday, 9/8, she suddenly developed shortness of breath as if she were winded. She tried to cough but was unable to do so and subsequently had a difficult time catching her breath. She then developed this sensation as if her skin was incredibly hot as if it were burning up. She ate some ice and felt somewhat better. About an hour later, she again developed this sensation of feeling very hot and had to turn the air conditioning up. She developed some nausea and had an episode of vomiting. It was after this time that she had some chest pain and heartburn which improved with vomiting.  The patient has tried both atorvastatin  and rosuvastatin . She had severe weakness with atorvastatin  and had hives with rosuvastatin . She has been trying to cut down on fried food. She previously was a longtime smoker but quit on 10/28/2018 after smoking about 1ppd for 50 years.  She currently states she feels well with no shortness of breath or chest pain.  Overall, she is not very active as she has shortness of breath with moderate walking and is unable to walk up a flight of stairs due to knee pain . The patient can walk about 50 feet before getting short of breath.  Past Medical History:  Diagnosis Date   Arthritis    COPD (chronic obstructive pulmonary disease) (HCC)    Pneumonia     Past Surgical History:  Procedure Laterality Date   DILATION AND CURETTAGE OF UTERUS  1970      Home Medications:  Prior to Admission medications   Medication Sig Start Date End Date Taking? Authorizing Provider  acetaminophen  (TYLENOL ) 500 MG tablet Take 1,000 mg by mouth every 6 (six) hours as needed for moderate pain (pain).     [provider]  albuterol  (VENTOLIN  HFA) 108 (90 Base) MCG/ACT inhaler Inhale 2 puffs into the lungs every 6 (six) hours as needed for wheezing or shortness of breath.    [provider]  aspirin  81 MG chewable tablet Chew 1 tablet (81 mg total) by mouth daily. 01/16/23   Tharon Lung, MD  Calcium  Carb-Cholecalciferol (CALCIUM  600-D PO) Take 1 tablet by mouth daily.    [provider]  hydrochlorothiazide  (HYDRODIURIL ) 12.5 MG tablet TAKE 1/2 TABLET BY MOUTH DAILY 06/29/23   Joshua Domino, DO  Lidocaine  (ASPERCREME MAX STRENGTH) 4 % AERO Apply 1 Application topically daily as needed (hip arthritis pain).    [provider]  Multiple Vitamin (MULTIVITAMIN WITH MINERALS) TABS tablet Take 1 tablet by mouth daily.    [provider]  Tiotropium Bromide  Monohydrate (SPIRIVA  RESPIMAT) 2.5 MCG/ACT AERS INHALE 2 PUFFS BY MOUTH INTO THE LUNGS DAILY 06/10/23   Joshua Domino, DO    Scheduled Meds:  Continuous Infusions:  heparin  800 Units/hr (09/12/23 2318)   PRN Meds:   Allergies:    Allergies  Allergen Reactions   Prednisone  Itching and Rash    Patient broke out in  a full body rash   Tizanidine  Itching and Rash    Patient broke out in a full body rash   Lipitor [Atorvastatin ] Other (See Comments)    Muscle/joint pain   Crestor  [Rosuvastatin ] Rash    Social History:   Social History   Socioeconomic History   Marital status: Divorced    Spouse name: Not on file   Number of children: 1   Years of education: 12   Highest education level: 12th grade  Occupational History   Occupation: retired  Tobacco Use   Smoking status: Former    Current packs/day: 0.50    Average packs/day: 0.5 packs/day for 57.7 years  (28.8 ttl pk-yrs)    Types: Cigarettes    Start date: 01/04/1966    Passive exposure: Past   Smokeless tobacco: Never   Tobacco comments:    quit 10/28/2018  Vaping Use   Vaping status: Never Used  Substance and Sexual Activity   Alcohol use: Not Currently   Drug use: Never   Sexual activity: Not Currently  Other Topics Concern   Not on file  Social History Narrative   Health Care POA: Emergency Contact: Garnette (son) [phone]       Patient lives with her son and grandson. Patient has dogs and cats.    Patient enjoys gardening and walking for exercise.    Patients sister has Parkinson's Disease. Patient enjoys spending time with her.          Social Drivers of Corporate investment banker Strain: Low Risk  (04/14/2023)   Overall Financial Resource Strain (CARDIA)    Difficulty of Paying Living Expenses: Not hard at all  Food Insecurity: No Food Insecurity (04/14/2023)   Hunger Vital Sign    Worried About Running Out of Food in the Last Year: Never true    Ran Out of Food in the Last Year: Never true  Transportation Needs: No Transportation Needs (04/14/2023)   PRAPARE - Administrator, Civil Service (Medical): No    Lack of Transportation (Non-Medical): No  Physical Activity: Sufficiently Active (04/14/2023)   Exercise Vital Sign    Days of Exercise per Week: 7 days    Minutes of Exercise per Session: 30 min  Stress: No Stress Concern Present (04/14/2023)   [employer] of Occupational Health - Occupational Stress Questionnaire    Feeling of Stress : Not at all  Social Connections: Moderately Isolated (04/14/2023)   Social Connection and Isolation Panel    Frequency of Communication with Friends and Family: Twice a week    Frequency of Social Gatherings with Friends and Family: Three times a week    Attends Religious Services: More than 4 times per year    Active Member of Clubs or Organizations: No    Attends Banker Meetings: Never    Marital  Status: Divorced  Catering manager Violence: Not At Risk (04/14/2023)   Humiliation, Afraid, Rape, and Kick questionnaire    Fear of Current or Ex-Partner: No    Emotionally Abused: No    Physically Abused: No    Sexually Abused: No    Family History:   Family History  Problem Relation Age of Onset   Diabetes Mother    Heart disease Mother    Heart disease Father    Parkinson's disease Sister    Dementia Sister    Alcohol abuse Son      ROS:  Please see the history of present illness.  All other  ROS reviewed and negative.     Physical Exam/Data: Vitals:   09/12/23 2130 09/12/23 2200 09/12/23 2245 09/12/23 2300  BP: 113/72 112/63 125/76 117/74  Pulse: 95 93 92 95  Resp: 14 12 12 16   Temp:      TempSrc:      SpO2: 97% 97% 98% 97%  Weight:      Height:       No intake or output data in the 24 hours ending 09/12/23 2359    09/12/2023    9:10 PM 08/30/2023   10:26 AM 04/25/2023    8:30 AM  Last 3 Weights  Weight (lbs) 144 lb 144 lb 12.8 oz 155 lb 6.4 oz  Weight (kg) 65.318 kg 65.681 kg 70.489 kg     Body mass index is 26.34 kg/m.  General:  Well nourished, well developed, in no acute distress HEENT: normal Neck: JVD mildly elevated Vascular: No carotid bruits; Distal pulses 2+ bilaterally Cardiac:  normal S1, S2; RRR; no murmur  Lungs:  clear to auscultation bilaterally, no wheezing, rhonchi or rales  Abd: soft, nontender Ext: no edema Musculoskeletal:  No deformities, BUE and BLE strength normal and equal Skin: warm and dry  Neuro:  no focal abnormalities noted Psych:  Normal affect   EKG:  The EKG was personally reviewed and demonstrates:  SR with low voltage in limb leads, nonspecific STT changes, does not meet STEMI criteria Telemetry:  Telemetry was personally reviewed and demonstrates:  SR, occasional PVC  Relevant CV Studies: TTE pending  Laboratory Data: High Sensitivity Troponin:   Recent Labs  Lab 09/12/23 2110 09/12/23 2345  TROPONINIHS 594*  1,098*     Chemistry Recent Labs  Lab 09/12/23 2110  NA 134*  K 3.3*  CL 94*  CO2 27  GLUCOSE 132*  BUN 8  CREATININE 0.72  CALCIUM  8.9  GFRNONAA >60  ANIONGAP 13    Recent Labs  Lab 09/12/23 2110  PROT 6.8  ALBUMIN 3.9  AST 24  ALT 16  ALKPHOS 85  BILITOT 0.6   Hematology Recent Labs  Lab 09/12/23 2110  WBC 11.1*  RBC 3.96  HGB 12.1  HCT 36.3  MCV 91.7  MCH 30.6  MCHC 33.3  RDW 12.6  PLT 293   Assessment and Plan: NSTEMI type 1 Hyperlipidemia Hypertension COPD Former smoker The patient presented with atypical symptoms but did have some chest pain and has a significant troponin rise. Concern is for a type 1 NSTEMI. She is asymptomatic currently, and hemodynamically stable. No concerning arrhythmias on telemetry. She has multiple risk factors for CAD/NSTEMI. Heparin  gtt was started in the ED, and she has been aspirin  loaded. She is intolerant to statins in the past so we can try ezetimibe . She will likely need either bempedoic acid or PCSK9i on the outpatient setting. She has a mildly elevated JVD so if she develops an oxygen requirement or tachypnea then I would trial a low dose of IV Lasix . No STEMI on ECG but does not some nonspecific STT changes - NPO at midnight for coronary angiogram  - TTE in AM - Aspirin  81mg  daily  - Continue heparin  gtt - Start ezetimibe  10 mg daily - Can trial sublingual nitroglycerin  for chest pain - Lipid panel and hgb A1c in AM - Trend troponin to peak  Risk Assessment/Risk Scores:   TIMI Risk Score for Unstable Angina or Non-ST Elevation MI:   The patient's TIMI risk score is 4, which indicates a 20% risk of all cause  mortality, new or recurrent myocardial infarction or need for urgent revascularization in the next 14 days. GRACE score 147; 18% probability of detah from admission to 6 months     For questions or updates, please contact [facility] Please consult [email or web address] for contact info under     Signed, Jerrell DELENA Orchard, MD  09/12/2023 11:59 PM  -------------------------------------------------------------------------------------------------------------------  Addendum:  After conducting a review of all available clinical information with the care team, interviewing the patient, and performing a physical exam, I agree with the findings and plan described in this note.   78 year old female with hypertension, hyperlipidemia, COPD, former 50 PY smoker, admitted for chest pain  Episode of diaphoresis with chest pain and vomiting lasting for about 2 hours on 09/12/2023 evening.  Currently chest pain-free.  EKG 09/12/2023: Sinus rhythm 95 bpm Minimal ST elevation lateral leads, does not meet STEMI criteria  NSTEMI: High sensitive troponin 1000 Sodium 130, raising suspicion of heart failure, although appears euvolemic on exam without any clear symptoms of heart failure. Will obtain echocardiogram, followed by left +-right heart catheterization, coronary angiography and possible intervention. Continue aspirin , heparin . Has not tolerated Crestor  on Lipitor in the past due to myalgias and rash. Currently on Zetia  10 mg daily.  Will try pravastatin  20 mg daily in addition.  Very likely, she will need to be on injectable agents going forward.  Newman Lawrence, MD

## 2023-09-12 NOTE — ED Triage Notes (Signed)
 Pt BIB [facility] for nausea and vomiting, and c/o chest pain from vomiting 2 times in the last 1.5 hrs.   Ems: Zofran 

## 2023-09-12 NOTE — Progress Notes (Signed)
 PHARMACY - ANTICOAGULATION CONSULT NOTE  Pharmacy Consult for heparin  Indication: chest pain/ACS  Allergies  Allergen Reactions   Prednisone  Itching and Rash    Patient broke out in a full body rash   Tizanidine  Itching and Rash    Patient broke out in a full body rash   Lipitor [Atorvastatin ] Other (See Comments)    Muscle/joint pain   Crestor  [Rosuvastatin ] Rash    Patient Measurements: Height: 5' 2 (157.5 cm) Weight: 65.3 kg (144 lb) IBW/kg (Calculated) : 50.1 HEPARIN  DW (KG): 63.4  Vital Signs: Temp: 97.5 F (36.4 C) (09/08 2114) Temp Source: Oral (09/08 2114) BP: 125/75 (09/08 2114) Pulse Rate: 93 (09/08 2114)  Labs: Recent Labs    09/12/23 2110  HGB 12.1  HCT 36.3  PLT 293  CREATININE 0.72  TROPONINIHS 594*    Estimated Creatinine Clearance: 52.2 mL/min (by C-G formula based on SCr of 0.72 mg/dL).   Medical History: Past Medical History:  Diagnosis Date   Arthritis    COPD (chronic obstructive pulmonary disease) (HCC)    Pneumonia      Assessment: 35 YOF presenting with CP, N/V, elevated troponin, she is not on anticoagulation PTA, CBC wnl  Goal of Therapy:  Heparin  level 0.3-0.7 units/ml Monitor platelets by anticoagulation protocol: Yes   Plan:  Heparin  3800 units IV x 1, and gtt at 800 units/hr F/u 8 hour heparin  level F/u cards eval and recs  Dorn Poot, PharmD, [facility] Clinical Pharmacist ED Pharmacist Phone # [phone] 09/12/2023 11:04 PM

## 2023-09-12 NOTE — H&P (View-Only) (Signed)
 Cardiology Consultation   Patient ID: Kelly Snyder MRN: [id number]; DOB: 1945/03/11  Admit date: 09/12/2023 Date of Consult: 09/12/2023  PCP:  Kelly Earnest, MD   Kelly Snyder Providers Cardiologist:  None    Patient Profile: Kelly Snyder is a 78 y.o. female with a hx of hyperlipidemia, COPD, hypertension who is being seen 09/12/2023 for the evaluation of chest pain at the request of Kelly Snyder.  History of Present Illness: Kelly Snyder presented to the ED with mainly nausea and   She states that at 4pm on Monday, 9/8, she suddenly developed shortness of breath as if she were winded. She tried to cough but was unable to do so and subsequently had a difficult time catching her breath. She then developed this sensation as if her skin was incredibly hot as if it were burning up. She ate some ice and felt somewhat better. About an hour later, she again developed this sensation of feeling very hot and had to turn the air conditioning up. She developed some nausea and had an episode of vomiting. It was after this time that she had some chest pain and heartburn which improved with vomiting.  The patient has tried both atorvastatin  and rosuvastatin . She had severe weakness with atorvastatin  and had hives with rosuvastatin . She has been trying to cut down on fried food. She previously was a longtime smoker but quit on 10/28/2018 after smoking about 1ppd for 50 years.  She currently states she feels well with no shortness of breath or chest pain.  Overall, she is not very active as she has shortness of breath with moderate walking and is unable to walk up a flight of stairs due to knee pain . The patient can walk about 50 feet before getting short of breath.  Past Medical History:  Diagnosis Date   Arthritis    COPD (chronic obstructive pulmonary disease) (HCC)    Pneumonia     Past Surgical History:  Procedure Laterality Date   DILATION AND CURETTAGE OF UTERUS  1970      Home Medications:  Prior to Admission medications   Medication Sig Start Date End Date Taking? Authorizing Provider  acetaminophen  (TYLENOL ) 500 MG tablet Take 1,000 mg by mouth every 6 (six) hours as needed for moderate pain (pain).     [provider]  albuterol  (VENTOLIN  HFA) 108 (90 Base) MCG/ACT inhaler Inhale 2 puffs into the lungs every 6 (six) hours as needed for wheezing or shortness of breath.    [provider]  aspirin  81 MG chewable tablet Chew 1 tablet (81 mg total) by mouth daily. 01/16/23   Tharon Lung, MD  Calcium  Carb-Cholecalciferol (CALCIUM  600-D PO) Take 1 tablet by mouth daily.    [provider]  hydrochlorothiazide  (HYDRODIURIL ) 12.5 MG tablet TAKE 1/2 TABLET BY MOUTH DAILY 06/29/23   Joshua Domino, DO  Lidocaine  (ASPERCREME MAX STRENGTH) 4 % AERO Apply 1 Application topically daily as needed (hip arthritis pain).    [provider]  Multiple Vitamin (MULTIVITAMIN WITH MINERALS) TABS tablet Take 1 tablet by mouth daily.    [provider]  Tiotropium Bromide  Monohydrate (SPIRIVA  RESPIMAT) 2.5 MCG/ACT AERS INHALE 2 PUFFS BY MOUTH INTO THE LUNGS DAILY 06/10/23   Joshua Domino, DO    Scheduled Meds:  Continuous Infusions:  heparin  800 Units/hr (09/12/23 2318)   PRN Meds:   Allergies:    Allergies  Allergen Reactions   Prednisone  Itching and Rash    Patient broke out in  a full body rash   Tizanidine  Itching and Rash    Patient broke out in a full body rash   Lipitor [Atorvastatin ] Other (See Comments)    Muscle/joint pain   Crestor  [Rosuvastatin ] Rash    Social History:   Social History   Socioeconomic History   Marital status: Divorced    Spouse name: Not on file   Number of children: 1   Years of education: 12   Highest education level: 12th grade  Occupational History   Occupation: retired  Tobacco Use   Smoking status: Former    Current packs/day: 0.50    Average packs/day: 0.5 packs/day for 57.7 years  (28.8 ttl pk-yrs)    Types: Cigarettes    Start date: 01/04/1966    Passive exposure: Past   Smokeless tobacco: Never   Tobacco comments:    quit 10/28/2018  Vaping Use   Vaping status: Never Used  Substance and Sexual Activity   Alcohol use: Not Currently   Drug use: Never   Sexual activity: Not Currently  Other Topics Concern   Not on file  Social History Narrative   Health Care POA: Emergency Contact: Garnette (son) [phone]       Patient lives with her son and grandson. Patient has dogs and cats.    Patient enjoys gardening and walking for exercise.    Patients sister has Parkinson's Disease. Patient enjoys spending time with her.          Social Drivers of Corporate investment banker Strain: Low Risk  (04/14/2023)   Overall Financial Resource Strain (CARDIA)    Difficulty of Paying Living Expenses: Not hard at all  Food Insecurity: No Food Insecurity (04/14/2023)   Hunger Vital Sign    Worried About Running Out of Food in the Last Year: Never true    Ran Out of Food in the Last Year: Never true  Transportation Needs: No Transportation Needs (04/14/2023)   PRAPARE - Administrator, Civil Service (Medical): No    Lack of Transportation (Non-Medical): No  Physical Activity: Sufficiently Active (04/14/2023)   Exercise Vital Sign    Days of Exercise per Week: 7 days    Minutes of Exercise per Session: 30 min  Stress: No Stress Concern Present (04/14/2023)   [employer] of Occupational Health - Occupational Stress Questionnaire    Feeling of Stress : Not at all  Social Connections: Moderately Isolated (04/14/2023)   Social Connection and Isolation Panel    Frequency of Communication with Friends and Family: Twice a week    Frequency of Social Gatherings with Friends and Family: Three times a week    Attends Religious Services: More than 4 times per year    Active Member of Clubs or Organizations: No    Attends Banker Meetings: Never    Marital  Status: Divorced  Catering manager Violence: Not At Risk (04/14/2023)   Humiliation, Afraid, Rape, and Kick questionnaire    Fear of Current or Ex-Partner: No    Emotionally Abused: No    Physically Abused: No    Sexually Abused: No    Family History:   Family History  Problem Relation Age of Onset   Diabetes Mother    Heart disease Mother    Heart disease Father    Parkinson's disease Sister    Dementia Sister    Alcohol abuse Son      ROS:  Please see the history of present illness.  All other  ROS reviewed and negative.     Physical Exam/Data: Vitals:   09/12/23 2130 09/12/23 2200 09/12/23 2245 09/12/23 2300  BP: 113/72 112/63 125/76 117/74  Pulse: 95 93 92 95  Resp: 14 12 12 16   Temp:      TempSrc:      SpO2: 97% 97% 98% 97%  Weight:      Height:       No intake or output data in the 24 hours ending 09/12/23 2359    09/12/2023    9:10 PM 08/30/2023   10:26 AM 04/25/2023    8:30 AM  Last 3 Weights  Weight (lbs) 144 lb 144 lb 12.8 oz 155 lb 6.4 oz  Weight (kg) 65.318 kg 65.681 kg 70.489 kg     Body mass index is 26.34 kg/m.  General:  Well nourished, well developed, in no acute distress HEENT: normal Neck: JVD mildly elevated Vascular: No carotid bruits; Distal pulses 2+ bilaterally Cardiac:  normal S1, S2; RRR; no murmur  Lungs:  clear to auscultation bilaterally, no wheezing, rhonchi or rales  Abd: soft, nontender Ext: no edema Musculoskeletal:  No deformities, BUE and BLE strength normal and equal Skin: warm and dry  Neuro:  no focal abnormalities noted Psych:  Normal affect   EKG:  The EKG was personally reviewed and demonstrates:  SR with low voltage in limb leads, nonspecific STT changes, does not meet STEMI criteria Telemetry:  Telemetry was personally reviewed and demonstrates:  SR, occasional PVC  Relevant CV Studies: TTE pending  Laboratory Data: High Sensitivity Troponin:   Recent Labs  Lab 09/12/23 2110 09/12/23 2345  TROPONINIHS 594*  1,098*     Chemistry Recent Labs  Lab 09/12/23 2110  NA 134*  K 3.3*  CL 94*  CO2 27  GLUCOSE 132*  BUN 8  CREATININE 0.72  CALCIUM  8.9  GFRNONAA >60  ANIONGAP 13    Recent Labs  Lab 09/12/23 2110  PROT 6.8  ALBUMIN 3.9  AST 24  ALT 16  ALKPHOS 85  BILITOT 0.6   Hematology Recent Labs  Lab 09/12/23 2110  WBC 11.1*  RBC 3.96  HGB 12.1  HCT 36.3  MCV 91.7  MCH 30.6  MCHC 33.3  RDW 12.6  PLT 293   Assessment and Plan: NSTEMI type 1 Hyperlipidemia Hypertension COPD Former smoker The patient presented with atypical symptoms but did have some chest pain and has a significant troponin rise. Concern is for a type 1 NSTEMI. She is asymptomatic currently, and hemodynamically stable. No concerning arrhythmias on telemetry. She has multiple risk factors for CAD/NSTEMI. Heparin  gtt was started in the ED, and she has been aspirin  loaded. She is intolerant to statins in the past so we can try ezetimibe . She will likely need either bempedoic acid or PCSK9i on the outpatient setting. She has a mildly elevated JVD so if she develops an oxygen requirement or tachypnea then I would trial a low dose of IV Lasix . No STEMI on ECG but does not some nonspecific STT changes - NPO at midnight for coronary angiogram  - TTE in AM - Aspirin  81mg  daily  - Continue heparin  gtt - Start ezetimibe  10 mg daily - Can trial sublingual nitroglycerin  for chest pain - Lipid panel and hgb A1c in AM - Trend troponin to peak  Risk Assessment/Risk Scores:   TIMI Risk Score for Unstable Angina or Non-ST Elevation MI:   The patient's TIMI risk score is 4, which indicates a 20% risk of all cause  mortality, new or recurrent myocardial infarction or need for urgent revascularization in the next 14 days. GRACE score 147; 18% probability of detah from admission to 6 months     For questions or updates, please contact [facility] Please consult [email or web address] for contact info under     Signed, Jerrell DELENA Orchard, MD  09/12/2023 11:59 PM  -------------------------------------------------------------------------------------------------------------------  Addendum:  After conducting a review of all available clinical information with the care team, interviewing the patient, and performing a physical exam, I agree with the findings and plan described in this note.   78 year old female with hypertension, hyperlipidemia, COPD, former 50 PY smoker, admitted for chest pain  Episode of diaphoresis with chest pain and vomiting lasting for about 2 hours on 09/12/2023 evening.  Currently chest pain-free.  EKG 09/12/2023: Sinus rhythm 95 bpm Minimal ST elevation lateral leads, does not meet STEMI criteria  NSTEMI: High sensitive troponin 1000 Sodium 130, raising suspicion of heart failure, although appears euvolemic on exam without any clear symptoms of heart failure. Will obtain echocardiogram, followed by left +-right heart catheterization, coronary angiography and possible intervention. Continue aspirin , heparin . Has not tolerated Crestor  on Lipitor in the past due to myalgias and rash. Currently on Zetia  10 mg daily.  Will try pravastatin  20 mg daily in addition.  Very likely, she will need to be on injectable agents going forward.  Newman Lawrence, MD

## 2023-09-12 NOTE — ED Provider Notes (Signed)
 Pt with some choking tonight - had some phelgm in throat - had nausea and vomiting - then CP with the vomiting - continued and then resolved a short time later - now back to normal - no sx and normal EKG and VS.  Labs pending, no hx to consider cardiac cause or neuro cause -that being said even though the EKG did not reveal any obvious ST elevation the patient has a significant elevation in the troponin up over 500 consistent with non-ST elevation myocardial infarction.  She has had a prior echocardiogram earlier this year which had a normal ejection fraction, no prior history of heart catheterization that is in the chart, additionally there is no history of stress test in the chart either, the patient did have chest pain in 2020 and underwent a CT angiogram which showed pneumonia but no signs of pulmonary embolism  Will discuss with cardiology, heparin  started, aspirin  given, chest pain-free at this time  .Critical Care  Performed by: Cleotilde Rogue, MD Authorized by: Cleotilde Rogue, MD   Critical care provider statement:    Critical care time (minutes):  45   Critical care time was exclusive of:  Separately billable procedures and treating other patients and teaching time   Critical care was necessary to treat or prevent imminent or life-threatening deterioration of the following conditions:  Cardiac failure   Critical care was time spent personally by me on the following activities:  Development of treatment plan with patient or surrogate, discussions with consultants, evaluation of patient's response to treatment, examination of patient, obtaining history from patient or surrogate, review of old charts, re-evaluation of patient's condition, pulse oximetry, ordering and review of radiographic studies, ordering and review of laboratory studies and ordering and performing treatments and interventions   I assumed direction of critical care for this patient from another provider in my specialty: no      Care discussed with: admitting provider   Comments:        Final diagnoses:  NSTEMI (non-ST elevated myocardial infarction) (HCC)      Cleotilde Rogue, MD 09/12/23 2302

## 2023-09-12 NOTE — ED Notes (Signed)
Lab called to add on trop 

## 2023-09-12 NOTE — Consult Note (Incomplete)
 Cardiology Consultation   Patient ID: Kelly Snyder MRN: [id number]; DOB: 1945/02/19  Admit date: 09/12/2023 Date of Consult: 09/12/2023  PCP:  Lonnie Earnest, MD   [facility] Providers Cardiologist:  None   { Click here to update MD or APP on Care Team, Refresh:1}     Patient Profile: Kelly Snyder is a 78 y.o. female with a hx of hyperlipidemia, COPD, htn who is being seen 09/12/2023 for the evaluation of *** at the request of ***.  History of Present Illness: [name] ***  Short winded  4 pm got winded, tired to cough and unable to cough, hard time catching her breath   Skin felt hot nad felt like it was burning up, ate some ice and felt a little better  An hour later, got very hot,wanted to turn the air conditioning up   Vomited once; felt some acid reflux/heartburn type sensation and had some relief with vomiting     She has been trying to cut down on fried foods and trying to eat better  Had taken a statin but felt so weak that she was unable to stand off the couch (rosuvastatin )  Had hives with atorvastatin     Past Medical History:  Diagnosis Date  . Arthritis   . COPD (chronic obstructive pulmonary disease) (HCC)   . Pneumonia     Past Surgical History:  Procedure Laterality Date  . DILATION AND CURETTAGE OF UTERUS  1970     {Home Medications (Optional):21181}  Scheduled Meds:  Continuous Infusions: . heparin  800 Units/hr (09/12/23 2318)   PRN Meds:   Allergies:    Allergies  Allergen Reactions  . Prednisone  Itching and Rash    Patient broke out in a full body rash  . Tizanidine  Itching and Rash    Patient broke out in a full body rash  . Lipitor [Atorvastatin ] Other (See Comments)    Muscle/joint pain  . Crestor  [Rosuvastatin ] Rash    Social History:   Social History   Socioeconomic History  . Marital status: Divorced    Spouse name: Not on file  . Number of children: 1  . Years of education: 108  . Highest  education level: 12th grade  Occupational History  . Occupation: retired  Tobacco Use  . Smoking status: Former    Current packs/day: 0.50    Average packs/day: 0.5 packs/day for 57.7 years (28.8 ttl pk-yrs)    Types: Cigarettes    Start date: 01/04/1966    Passive exposure: Past  . Smokeless tobacco: Never  . Tobacco comments:    quit 10/28/2018  Vaping Use  . Vaping status: Never Used  Substance and Sexual Activity  . Alcohol use: Not Currently  . Drug use: Never  . Sexual activity: Not Currently  Other Topics Concern  . Not on file  Social History Narrative   Health Care POA: Emergency Contact: Garnette (son) [phone]       Patient lives with her son and grandson. Patient has dogs and cats.    Patient enjoys gardening and walking for exercise.    Patients sister has Parkinson's Disease. Patient enjoys spending time with her.          Social Drivers of Corporate investment banker Strain: Low Risk  (04/14/2023)   Overall Financial Resource Strain (CARDIA)   . Difficulty of Paying Living Expenses: Not hard at all  Food Insecurity: No Food Insecurity (04/14/2023)   Hunger Vital Sign   . Worried About  Running Out of Food in the Last Year: Never true   . Ran Out of Food in the Last Year: Never true  Transportation Needs: No Transportation Needs (04/14/2023)   PRAPARE - Transportation   . Lack of Transportation (Medical): No   . Lack of Transportation (Non-Medical): No  Physical Activity: Sufficiently Active (04/14/2023)   Exercise Vital Sign   . Days of Exercise per Week: 7 days   . Minutes of Exercise per Session: 30 min  Stress: No Stress Concern Present (04/14/2023)   [employer] of Occupational Health - Occupational Stress Questionnaire   . Feeling of Stress : Not at all  Social Connections: Moderately Isolated (04/14/2023)   Social Connection and Isolation Panel   . Frequency of Communication with Friends and Family: Twice a week   . Frequency of Social Gatherings  with Friends and Family: Three times a week   . Attends Religious Services: More than 4 times per year   . Active Member of Clubs or Organizations: No   . Attends Banker Meetings: Never   . Marital Status: Divorced  Catering manager Violence: Not At Risk (04/14/2023)   Humiliation, Afraid, Rape, and Kick questionnaire   . Fear of Current or Ex-Partner: No   . Emotionally Abused: No   . Physically Abused: No   . Sexually Abused: No    Family History:   *** Family History  Problem Relation Age of Onset  . Diabetes Mother   . Heart disease Mother   . Heart disease Father   . Parkinson's disease Sister   . Dementia Sister   . Alcohol abuse Son      ROS:  Please see the history of present illness.  *** All other ROS reviewed and negative.     Physical Exam/Data: Vitals:   09/12/23 2130 09/12/23 2200 09/12/23 2245 09/12/23 2300  BP: 113/72 112/63 125/76 117/74  Pulse: 95 93 92 95  Resp: 14 12 12 16   Temp:      TempSrc:      SpO2: 97% 97% 98% 97%  Weight:      Height:       No intake or output data in the 24 hours ending 09/12/23 2359    09/12/2023    9:10 PM 08/30/2023   10:26 AM 04/25/2023    8:30 AM  Last 3 Weights  Weight (lbs) 144 lb 144 lb 12.8 oz 155 lb 6.4 oz  Weight (kg) 65.318 kg 65.681 kg 70.489 kg     Body mass index is 26.34 kg/m.  General:  Well nourished, well developed, in no acute distress*** HEENT: normal Neck: no JVD Vascular: No carotid bruits; Distal pulses 2+ bilaterally Cardiac:  normal S1, S2; RRR; no murmur *** Lungs:  clear to auscultation bilaterally, no wheezing, rhonchi or rales  Abd: soft, nontender, no hepatomegaly  Ext: no edema Musculoskeletal:  No deformities, BUE and BLE strength normal and equal Skin: warm and dry  Neuro:  CNs 2-12 intact, no focal abnormalities noted Psych:  Normal affect   EKG:  The EKG was personally reviewed and demonstrates:  *** Telemetry:  Telemetry was personally reviewed and  demonstrates:  ***  Relevant CV Studies: ***  Laboratory Data: High Sensitivity Troponin:   Recent Labs  Lab 09/12/23 2110  TROPONINIHS 594*     Chemistry Recent Labs  Lab 09/12/23 2110  NA 134*  K 3.3*  CL 94*  CO2 27  GLUCOSE 132*  BUN 8  CREATININE 0.72  CALCIUM  8.9  GFRNONAA >60  ANIONGAP 13    Recent Labs  Lab 09/12/23 2110  PROT 6.8  ALBUMIN 3.9  AST 24  ALT 16  ALKPHOS 85  BILITOT 0.6   Lipids No results for input(s): CHOL, TRIG, HDL, LABVLDL, LDLCALC, CHOLHDL in the last 168 hours.  Hematology Recent Labs  Lab 09/12/23 2110  WBC 11.1*  RBC 3.96  HGB 12.1  HCT 36.3  MCV 91.7  MCH 30.6  MCHC 33.3  RDW 12.6  PLT 293   Thyroid  No results for input(s): TSH, FREET4 in the last 168 hours.  BNPNo results for input(s): BNP, PROBNP in the last 168 hours.  DDimer No results for input(s): DDIMER in the last 168 hours.  Radiology/Studies:  No results found.   Assessment and Plan: ***   Risk Assessment/Risk Scores: {Complete the following score calculators/questions to meet required metrics.  Press F2         :[id number]}   {Is the patient being seen for unstable angina, ACS, NSTEMI or STEMI?:[id number]} {Does this patient have CHF or CHF symptoms?      :[id number]} {Does this patient have ATRIAL FIBRILLATION?:[phone]}  {Are we signing off today?:[id number]}  For questions or updates, please contact [facility] Please consult [email or web address] for contact info under    Signed, Jerrell DELENA Orchard, MD  09/12/2023 11:59 PM

## 2023-09-13 ENCOUNTER — Encounter (HOSPITAL_COMMUNITY): Payer: Self-pay | Admitting: Family Medicine

## 2023-09-13 ENCOUNTER — Inpatient Hospital Stay (HOSPITAL_COMMUNITY)

## 2023-09-13 ENCOUNTER — Encounter (HOSPITAL_COMMUNITY)
Admission: EM | Disposition: A | Discharge status: Home / Self Care | Payer: Self-pay | Source: Home / Self Care | Attending: Family Medicine

## 2023-09-13 ENCOUNTER — Other Ambulatory Visit: Payer: Self-pay

## 2023-09-13 DIAGNOSIS — Z833 Family history of diabetes mellitus: Secondary | ICD-10-CM | POA: Diagnosis not present

## 2023-09-13 DIAGNOSIS — I214 Non-ST elevation (NSTEMI) myocardial infarction: Secondary | ICD-10-CM | POA: Diagnosis not present

## 2023-09-13 DIAGNOSIS — F17210 Nicotine dependence, cigarettes, uncomplicated: Secondary | ICD-10-CM | POA: Diagnosis present

## 2023-09-13 DIAGNOSIS — Z79899 Other long term (current) drug therapy: Secondary | ICD-10-CM | POA: Diagnosis not present

## 2023-09-13 DIAGNOSIS — Z811 Family history of alcohol abuse and dependence: Secondary | ICD-10-CM | POA: Diagnosis not present

## 2023-09-13 DIAGNOSIS — I249 Acute ischemic heart disease, unspecified: Secondary | ICD-10-CM | POA: Insufficient documentation

## 2023-09-13 DIAGNOSIS — Z7982 Long term (current) use of aspirin: Secondary | ICD-10-CM | POA: Diagnosis not present

## 2023-09-13 DIAGNOSIS — Z820 Family history of epilepsy and other diseases of the nervous system: Secondary | ICD-10-CM | POA: Diagnosis not present

## 2023-09-13 DIAGNOSIS — I5181 Takotsubo syndrome: Secondary | ICD-10-CM | POA: Diagnosis not present

## 2023-09-13 DIAGNOSIS — I10 Essential (primary) hypertension: Secondary | ICD-10-CM | POA: Diagnosis present

## 2023-09-13 DIAGNOSIS — J449 Chronic obstructive pulmonary disease, unspecified: Secondary | ICD-10-CM | POA: Diagnosis present

## 2023-09-13 DIAGNOSIS — I2510 Atherosclerotic heart disease of native coronary artery without angina pectoris: Secondary | ICD-10-CM

## 2023-09-13 DIAGNOSIS — M810 Age-related osteoporosis without current pathological fracture: Secondary | ICD-10-CM | POA: Diagnosis present

## 2023-09-13 DIAGNOSIS — I708 Atherosclerosis of other arteries: Secondary | ICD-10-CM | POA: Diagnosis present

## 2023-09-13 DIAGNOSIS — Z789 Other specified health status: Secondary | ICD-10-CM

## 2023-09-13 DIAGNOSIS — I2722 Pulmonary hypertension due to left heart disease: Secondary | ICD-10-CM | POA: Diagnosis present

## 2023-09-13 DIAGNOSIS — M1990 Unspecified osteoarthritis, unspecified site: Secondary | ICD-10-CM | POA: Diagnosis present

## 2023-09-13 DIAGNOSIS — I959 Hypotension, unspecified: Secondary | ICD-10-CM | POA: Diagnosis not present

## 2023-09-13 DIAGNOSIS — Z888 Allergy status to other drugs, medicaments and biological substances status: Secondary | ICD-10-CM | POA: Diagnosis not present

## 2023-09-13 DIAGNOSIS — Z8249 Family history of ischemic heart disease and other diseases of the circulatory system: Secondary | ICD-10-CM | POA: Diagnosis not present

## 2023-09-13 DIAGNOSIS — Z8701 Personal history of pneumonia (recurrent): Secondary | ICD-10-CM | POA: Diagnosis not present

## 2023-09-13 DIAGNOSIS — E785 Hyperlipidemia, unspecified: Secondary | ICD-10-CM | POA: Diagnosis present

## 2023-09-13 HISTORY — PX: RIGHT/LEFT HEART CATH AND CORONARY ANGIOGRAPHY: CATH118266

## 2023-09-13 LAB — POCT I-STAT 7, (LYTES, BLD GAS, ICA,H+H)
Acid-Base Excess: 1 mmol/L (ref 0.0–2.0)
Bicarbonate: 26.6 mmol/L (ref 20.0–28.0)
Calcium, Ion: 1.19 mmol/L (ref 1.15–1.40)
HCT: 33 % — ABNORMAL LOW (ref 36.0–46.0)
Hemoglobin: 11.2 g/dL — ABNORMAL LOW (ref 12.0–15.0)
O2 Saturation: 87 %
Potassium: 4 mmol/L (ref 3.5–5.1)
Sodium: 131 mmol/L — ABNORMAL LOW (ref 135–145)
TCO2: 28 mmol/L (ref 22–32)
pCO2 arterial: 43.5 mmHg (ref 32–48)
pH, Arterial: 7.395 (ref 7.35–7.45)
pO2, Arterial: 53 mmHg — ABNORMAL LOW (ref 83–108)

## 2023-09-13 LAB — URINALYSIS, ROUTINE W REFLEX MICROSCOPIC
Bilirubin Urine: NEGATIVE
Glucose, UA: NEGATIVE mg/dL
Hgb urine dipstick: NEGATIVE
Ketones, ur: 20 mg/dL — AB
Leukocytes,Ua: NEGATIVE
Nitrite: NEGATIVE
Protein, ur: NEGATIVE mg/dL
Specific Gravity, Urine: 1.01 (ref 1.005–1.030)
pH: 7 (ref 5.0–8.0)

## 2023-09-13 LAB — CBC
HCT: 35.4 % — ABNORMAL LOW (ref 36.0–46.0)
Hemoglobin: 12.2 g/dL (ref 12.0–15.0)
MCH: 31.1 pg (ref 26.0–34.0)
MCHC: 34.5 g/dL (ref 30.0–36.0)
MCV: 90.3 fL (ref 80.0–100.0)
Platelets: 308 K/uL (ref 150–400)
RBC: 3.92 MIL/uL (ref 3.87–5.11)
RDW: 12.6 % (ref 11.5–15.5)
WBC: 9.4 K/uL (ref 4.0–10.5)
nRBC: 0 % (ref 0.0–0.2)

## 2023-09-13 LAB — POCT I-STAT EG7
Acid-Base Excess: 2 mmol/L (ref 0.0–2.0)
Bicarbonate: 27.4 mmol/L (ref 20.0–28.0)
Calcium, Ion: 1.19 mmol/L (ref 1.15–1.40)
HCT: 32 % — ABNORMAL LOW (ref 36.0–46.0)
Hemoglobin: 10.9 g/dL — ABNORMAL LOW (ref 12.0–15.0)
O2 Saturation: 54 %
Potassium: 4 mmol/L (ref 3.5–5.1)
Sodium: 131 mmol/L — ABNORMAL LOW (ref 135–145)
TCO2: 29 mmol/L (ref 22–32)
pCO2, Ven: 46.7 mmHg (ref 44–60)
pH, Ven: 7.377 (ref 7.25–7.43)
pO2, Ven: 30 mmHg — CL (ref 32–45)

## 2023-09-13 LAB — ECHOCARDIOGRAM COMPLETE
Area-P 1/2: 5.75 cm2
Height: 62 in
S' Lateral: 2.8 cm
Weight: 2304 [oz_av]

## 2023-09-13 LAB — TROPONIN I (HIGH SENSITIVITY)
Troponin I (High Sensitivity): 1093 ng/L (ref <18)
Troponin I (High Sensitivity): 1098 ng/L (ref <18)
Troponin I (High Sensitivity): 975 ng/L (ref <18)

## 2023-09-13 LAB — BASIC METABOLIC PANEL WITH GFR
Anion gap: 9 (ref 5–15)
BUN: 7 mg/dL — ABNORMAL LOW (ref 8–23)
CO2: 25 mmol/L (ref 22–32)
Calcium: 8.9 mg/dL (ref 8.9–10.3)
Chloride: 96 mmol/L — ABNORMAL LOW (ref 98–111)
Creatinine, Ser: 0.76 mg/dL (ref 0.44–1.00)
GFR, Estimated: >60 mL/min (ref >60)
Glucose, Bld: 116 mg/dL — ABNORMAL HIGH (ref 70–99)
Potassium: 4.1 mmol/L (ref 3.5–5.1)
Sodium: 130 mmol/L — ABNORMAL LOW (ref 135–145)

## 2023-09-13 LAB — LIPID PANEL
Cholesterol: 206 mg/dL — ABNORMAL HIGH (ref 0–200)
HDL: 84 mg/dL (ref >40)
LDL Cholesterol: 115 mg/dL — ABNORMAL HIGH (ref 0–99)
Total CHOL/HDL Ratio: 2.5 ratio
Triglycerides: 34 mg/dL (ref <150)
VLDL: 7 mg/dL (ref 0–40)

## 2023-09-13 LAB — LIPASE, BLOOD: Lipase: 26 U/L (ref 11–51)

## 2023-09-13 LAB — HEPARIN LEVEL (UNFRACTIONATED): Heparin Unfractionated: 0.35 [IU]/mL (ref 0.30–0.70)

## 2023-09-13 SURGERY — RIGHT/LEFT HEART CATH AND CORONARY ANGIOGRAPHY
Anesthesia: LOCAL

## 2023-09-13 MED ORDER — LOSARTAN POTASSIUM 25 MG PO TABS
25.0000 mg | ORAL_TABLET | Freq: Every day | ORAL | Status: DC
  Administered 2023-09-13: 25 mg via ORAL
  Filled 2023-09-13 (×2): qty 1

## 2023-09-13 MED ORDER — IOHEXOL 350 MG/ML SOLN
INTRAVENOUS | Status: DC | PRN
  Administered 2023-09-13: 45 mL

## 2023-09-13 MED ORDER — HEPARIN SODIUM (PORCINE) 1000 UNIT/ML IJ SOLN
INTRAMUSCULAR | Status: AC
  Filled 2023-09-13: qty 10

## 2023-09-13 MED ORDER — PERFLUTREN LIPID MICROSPHERE
1.0000 mL | INTRAVENOUS | Status: AC | PRN
  Administered 2023-09-13: 3 mL via INTRAVENOUS

## 2023-09-13 MED ORDER — NITROGLYCERIN 0.4 MG SL SUBL: 0.4000 mg | SUBLINGUAL_TABLET | SUBLINGUAL | Status: DC | PRN

## 2023-09-13 MED ORDER — TIOTROPIUM BROMIDE MONOHYDRATE 2.5 MCG/ACT IN AERS: 2.0000 | INHALATION_SPRAY | Freq: Every day | RESPIRATORY_TRACT | Status: DC

## 2023-09-13 MED ORDER — ASPIRIN 81 MG PO CHEW
81.0000 mg | CHEWABLE_TABLET | ORAL | Status: AC
  Administered 2023-09-13: 81 mg via ORAL
  Filled 2023-09-13: qty 1

## 2023-09-13 MED ORDER — MIDAZOLAM HCL 2 MG/2ML IJ SOLN
INTRAMUSCULAR | Status: AC
  Filled 2023-09-13: qty 2

## 2023-09-13 MED ORDER — LIDOCAINE HCL (PF) 1 % IJ SOLN
INTRAMUSCULAR | Status: AC | PRN
  Administered 2023-09-13: 2 mL via INTRADERMAL

## 2023-09-13 MED ORDER — LIDOCAINE HCL (PF) 1 % IJ SOLN
INTRAMUSCULAR | Status: AC
  Filled 2023-09-13: qty 30

## 2023-09-13 MED ORDER — SODIUM CHLORIDE 0.9 % IV SOLN: 250.0000 mL | INTRAVENOUS | Status: AC | PRN

## 2023-09-13 MED ORDER — HEPARIN SODIUM (PORCINE) 1000 UNIT/ML IJ SOLN
INTRAMUSCULAR | Status: DC | PRN
  Administered 2023-09-13: 3000 [IU] via INTRAVENOUS

## 2023-09-13 MED ORDER — FENTANYL CITRATE (PF) 100 MCG/2ML IJ SOLN
INTRAMUSCULAR | Status: DC | PRN
  Administered 2023-09-13: 50 ug via INTRAVENOUS

## 2023-09-13 MED ORDER — VERAPAMIL HCL 2.5 MG/ML IV SOLN
INTRAVENOUS | Status: AC
  Filled 2023-09-13: qty 2

## 2023-09-13 MED ORDER — MIDAZOLAM HCL 2 MG/2ML IJ SOLN
INTRAMUSCULAR | Status: DC | PRN
  Administered 2023-09-13: 1 mg via INTRAVENOUS

## 2023-09-13 MED ORDER — HEPARIN SODIUM (PORCINE) 5000 UNIT/ML IJ SOLN
5000.0000 [IU] | Freq: Three times a day (TID) | INTRAMUSCULAR | Status: DC
  Administered 2023-09-13 – 2023-09-15 (×6): 5000 [IU] via SUBCUTANEOUS
  Filled 2023-09-13 (×6): qty 1

## 2023-09-13 MED ORDER — CARVEDILOL 3.125 MG PO TABS
3.1250 mg | ORAL_TABLET | Freq: Two times a day (BID) | ORAL | Status: DC
Start: 2023-09-13 — End: 2023-09-14
  Administered 2023-09-13: 3.125 mg via ORAL
  Filled 2023-09-13 (×2): qty 1

## 2023-09-13 MED ORDER — SODIUM CHLORIDE 0.9% FLUSH: 3.0000 mL | INTRAVENOUS | Status: DC | PRN

## 2023-09-13 MED ORDER — ACETAMINOPHEN 325 MG PO TABS: 650.0000 mg | ORAL_TABLET | Freq: Four times a day (QID) | ORAL | Status: DC | PRN

## 2023-09-13 MED ORDER — ASPIRIN 81 MG PO TBEC: 81.0000 mg | DELAYED_RELEASE_TABLET | Freq: Every day | ORAL | Status: DC

## 2023-09-13 MED ORDER — LIDOCAINE HCL (PF) 1 % IJ SOLN
INTRAMUSCULAR | Status: DC | PRN
  Administered 2023-09-13: 2 mL

## 2023-09-13 MED ORDER — ACETAMINOPHEN 650 MG RE SUPP: 650.0000 mg | Freq: Four times a day (QID) | RECTAL | Status: DC | PRN

## 2023-09-13 MED ORDER — ACETAMINOPHEN 325 MG PO TABS
ORAL_TABLET | ORAL | Status: AC
  Administered 2023-09-13: 325 mg
  Filled 2023-09-13: qty 2

## 2023-09-13 MED ORDER — POTASSIUM CHLORIDE CRYS ER 20 MEQ PO TBCR
40.0000 meq | EXTENDED_RELEASE_TABLET | Freq: Once | ORAL | Status: AC
  Administered 2023-09-13: 40 meq via ORAL
  Filled 2023-09-13: qty 2

## 2023-09-13 MED ORDER — UMECLIDINIUM BROMIDE 62.5 MCG/ACT IN AEPB
1.0000 | INHALATION_SPRAY | Freq: Every day | RESPIRATORY_TRACT | Status: DC
  Administered 2023-09-13 – 2023-09-15 (×3): 1 via RESPIRATORY_TRACT
  Filled 2023-09-13: qty 7

## 2023-09-13 MED ORDER — PRAVASTATIN SODIUM 10 MG PO TABS
20.0000 mg | ORAL_TABLET | Freq: Every day | ORAL | Status: DC
  Administered 2023-09-13 – 2023-09-14 (×2): 20 mg via ORAL
  Filled 2023-09-13 (×2): qty 2

## 2023-09-13 MED ORDER — FREE WATER: 500.0000 mL | Freq: Once | Status: DC

## 2023-09-13 MED ORDER — EZETIMIBE 10 MG PO TABS
10.0000 mg | ORAL_TABLET | Freq: Every day | ORAL | Status: DC
Start: 2023-09-13 — End: 2023-09-15
  Administered 2023-09-14 – 2023-09-15 (×2): 10 mg via ORAL
  Filled 2023-09-13 (×2): qty 1

## 2023-09-13 MED ORDER — VERAPAMIL HCL 2.5 MG/ML IV SOLN
INTRAVENOUS | Status: DC | PRN
  Administered 2023-09-13: 10 mL via INTRA_ARTERIAL

## 2023-09-13 MED ORDER — FENTANYL CITRATE (PF) 100 MCG/2ML IJ SOLN
INTRAMUSCULAR | Status: AC
  Filled 2023-09-13: qty 2

## 2023-09-13 SURGICAL SUPPLY — 9 items
CATH BALLN WEDGE 5F 110CM (CATHETERS) IMPLANT
CATH INFINITI 5FR ANG PIGTAIL (CATHETERS) IMPLANT
CATH INFINITI AMBI 5FR TG (CATHETERS) IMPLANT
DEVICE RAD COMP TR BAND LRG (VASCULAR PRODUCTS) IMPLANT
GLIDESHEATH SLEND SS 6F .021 (SHEATH) IMPLANT
GUIDEWIRE INQWIRE 1.5J.035X260 (WIRE) IMPLANT
PACK CARDIAC CATHETERIZATION (CUSTOM PROCEDURE TRAY) ×2 IMPLANT
SET ATX-X65L (MISCELLANEOUS) IMPLANT
SHEATH GLIDE SLENDER 4/5FR (SHEATH) IMPLANT

## 2023-09-13 NOTE — Assessment & Plan Note (Signed)
 Risk factors of tobacco use and subclavian artery stenosis. In ED EKG with T wave abnormalities w/ significantly elevated Troponin 1098 this morning.  - Cardiology consulted, planning PCI and TTE today - Trend Troponin Q2H - Continue Heparin  GTT - NPO- pre-procedure - Nitroglycerin  prn chest pain - ASA 81mg  daily - Fall precautions

## 2023-09-13 NOTE — Hospital Course (Signed)
 Kelly Snyder is a 78 y.o.female with a history of HTN, HLD who was admitted to the [facility] Medicine Teaching Service at [facility] for ACS. Her hospital course is detailed below:    Other chronic conditions were medically managed with home medications and formulary alternatives as necessary (***)  PCP Follow-up Recommendations:

## 2023-09-13 NOTE — ED Notes (Signed)
Light blue top sent to lab

## 2023-09-13 NOTE — Progress Notes (Signed)
 Admission questions completed; RN physical assessments left to complete.

## 2023-09-13 NOTE — TOC CM/SW Note (Signed)
 Transition of Care [facility]) - Inpatient Brief Assessment   Patient Details  Name: Kelly Snyder MRN: [id number] Date of Birth: 1945-09-15  Transition of Care [facility]) CM/SW Contact:    Waddell Barnie Rama, RN Phone Number: 09/13/2023, 3:37 PM   Clinical Narrative: From home with son, has PCP and insurance on file, states has no HH services in place at this time or DME at home.  States family member (son)  will transport them home at [employer] and family is support system, states gets medications from [facility] on [address] In [place].  Pta self ambulatory.   There are no ICM (Inpatient Case Management)  needs identified  at this time.  Please place consult for ICM (inpatient Case Management)  needs.     Transition of Care Asessment: Insurance and Status: Insurance coverage has been reviewed Patient has primary care physician: Yes Home environment has been reviewed: home with son Prior level of function:: indep Prior/Current Home Services: No current home services Social Drivers of Health Review: SDOH reviewed no interventions necessary Readmission risk has been reviewed: Yes Transition of care needs: no transition of care needs at this time

## 2023-09-13 NOTE — ED Notes (Signed)
 Patient provided some toiletries to freshen up.

## 2023-09-13 NOTE — Assessment & Plan Note (Addendum)
 Risk factors of tobacco use and subclavian artery stenosis but does not appear to have prior cardiac workup. EKG with T wave abnormalities in precordial lead but otherwise asymptomatic upon admission. Cards following and planning to cath in the AM. - Admit to FMTS, attending Dr. Donah - Cardiology consulted, planning PCI today - Continue Heparin  GTT - NPO - Nitroglycerin  prn chest pain - Echo - Ezetimibe  per Cards given statin intolerance, obtain AM lipid panel - ASA 81mg  daily - Progressive unit, Vital signs per floor, continuous cardiac monitoring - Fall precautions

## 2023-09-13 NOTE — Progress Notes (Signed)
  Echocardiogram 2D Echocardiogram has been performed.  Tinnie FORBES Gosling RDCS 09/13/2023, 9:20 AM

## 2023-09-13 NOTE — ED Notes (Signed)
 Pt ambulated to the restroom with a steady gait. Denies CP,  SOB, or dizziness,

## 2023-09-13 NOTE — ED Notes (Signed)
 Pt on the monitor. Denies chest pain

## 2023-09-13 NOTE — Progress Notes (Signed)
 Daily Progress Note Intern Pager: [phone]  Patient name: Kelly Snyder Medical record number: [id number] Date of birth: 09/07/1945 Age: 78 y.o. Gender: female  Primary Care Provider: Lonnie Earnest, MD Consultants: Cardiology Code Status: Full  Pt Overview and Major Events to Date:  09/13/23 : Admitted to FMTS service    Pertinent PMH/PSH includes COPD HLD HTN Tobacco use Osteoporosis Subclavian artery stenosis   Assessment and Plan Kelly Snyder is a 78 y.o. female with past medical history of COPD,HTN, HLD, and Subclavian artery stenosis BIB EMS w/ c/o nausea/vomiting ED worked up c/o NSTEMI Assessment & Plan NSTEMI (non-ST elevated myocardial infarction) (HCC) ACS (acute coronary syndrome) (HCC) Risk factors of tobacco use and subclavian artery stenosis. In ED EKG with T wave abnormalities w/ significantly elevated Troponin 1098 this morning.  - Cardiology consulted, planning PCI and TTE today - Trend Troponin Q2H - Continue Heparin  GTT - NPO- pre-procedure - Nitroglycerin  prn chest pain - ASA 81mg  daily - Fall precautions Chronic health problem COPD: Home Albuterol  prn, Spiriva  2 puffs daily. Continue INCRUSE ELLIPTA  62.5 MCG/ACT 1 puff daily HLD  HTN : Continue home Hydrochlorothiazide  12.5mg  daily (1/2 tablet)  Osteoporosis Subclavian artery stenosis Tobacco use  FEN/GI: NPO pre-procedure, Last BM 09/12/23 PPx: Heparin  gtt Dispo:pending   Subjective:  Kelly Snyder is a 78 y.o. female admitted for NSTEMI. Patient denies chest pain, N/V since admission.   Objective: Temp:  [97.5 F (36.4 C)-98 F (36.7 C)] 97.6 F (36.4 C) (09/09 0543) Pulse Rate:  [92-109] 104 (09/09 0515) Resp:  [12-21] 19 (09/09 0515) BP: (112-127)/(63-76) 112/67 (09/09 0515) SpO2:  [94 %-98 %] 94 % (09/09 0515) Weight:  [65.3 kg] 65.3 kg (09/08 2110)  Physical Exam Cardiovascular:     Rate and Rhythm: Normal rate.     Pulses: Normal pulses.  Pulmonary:     Effort:  Pulmonary effort is normal.     Breath sounds: Normal breath sounds.  Abdominal:     Palpations: Abdomen is soft.  Neurological:     Mental Status: She is oriented to person, place, and time. Mental status is at baseline.  Psychiatric:        Mood and Affect: Mood normal.        Behavior: Behavior normal.      Laboratory: Most recent CBC Lab Results  Component Value Date   WBC 9.4 09/13/2023   HGB 12.2 09/13/2023   HCT 35.4 (L) 09/13/2023   MCV 90.3 09/13/2023   PLT 308 09/13/2023   Most recent BMP    Latest Ref Rng & Units 09/13/2023    5:18 AM  BMP  Glucose 70 - 99 mg/dL 883   BUN 8 - 23 mg/dL 7   Creatinine 9.55 - 8.99 mg/dL 9.23   Sodium 864 - 854 mmol/L 130   Potassium 3.5 - 5.1 mmol/L 4.1   Chloride 98 - 111 mmol/L 96   CO2 22 - 32 mmol/L 25   Calcium  8.9 - 10.3 mg/dL 8.9     Troponin : 8901  Imaging/Diagnostic Tests: EXAM: CT CHEST WITHOUT CONTRAST LOW-DOSE FOR LUNG CANCER SCREENING  IMPRESSION: 1. Lung-RADS 2, benign appearance or behavior. Continue annual screening with low-dose chest CT without contrast in 12 months. 2. Aortic atherosclerosis (ICD10-I70.0). Coronary artery calcification. 3.  Emphysema (ICD10-J43.9)    Suzen Elder B, DO 09/13/2023, 8:22 AM  PGY-1, [facility] FPTS Intern pager: [phone], text pages welcome Secure chat group [facility] Teaching  Service

## 2023-09-13 NOTE — Progress Notes (Signed)
 PHARMACY - ANTICOAGULATION CONSULT NOTE  Pharmacy Consult for heparin  Indication: chest pain/ACS  Allergies  Allergen Reactions   Prednisone  Itching and Rash    Patient broke out in a full body rash   Tizanidine  Itching and Rash    Patient broke out in a full body rash   Lipitor [Atorvastatin ] Other (See Comments)    Muscle/joint pain   Crestor  [Rosuvastatin ] Rash    Patient Measurements: Height: 5' 2 (157.5 cm) Weight: 65.3 kg (144 lb) IBW/kg (Calculated) : 50.1 HEPARIN  DW (KG): 63.4  Vital Signs: Temp: 97.6 F (36.4 C) (09/09 0929) Temp Source: Oral (09/09 0929) BP: 121/68 (09/09 0925) Pulse Rate: 98 (09/09 0925)  Labs: Recent Labs    09/12/23 2110 09/12/23 2345 09/13/23 0518 09/13/23 0752  HGB 12.1  --  12.2  --   HCT 36.3  --  35.4*  --   PLT 293  --  308  --   HEPARINUNFRC  --   --   --  0.35  CREATININE 0.72  --  0.76  --   TROPONINIHS 594* 1,098*  --  1,093*    Estimated Creatinine Clearance: 52.2 mL/min (by C-G formula based on SCr of 0.76 mg/dL).  Assessment: 34 YOF presenting with CP, N/V, elevated troponin, she is not on anticoagulation PTA. Pharmacy consulted to manage IV heparin  for NSTEMI.  Heparin  level is therapeutic at 0.35 on 800 units/hr. No bleeding noted, CBC stable. Cath planned  Goal of Therapy:  Heparin  level 0.3-0.7 units/ml Monitor platelets by anticoagulation protocol: Yes   Plan:  Continue heparin  drip at 800 units/hr F/u 6 hr confirm heparin  level Daily heparin  level, CBC Monitor for s/sx of bleeding F/u after cath  Thank you for involving pharmacy in this patient's care.  Delon Sax, PharmD, BCPS Clinical Pharmacist Clinical phone for 09/13/2023 is [phone] 09/13/2023 9:44 AM

## 2023-09-13 NOTE — Assessment & Plan Note (Signed)
 COPD: Home Albuterol  prn, Spiriva  2 puffs daily. Continue INCRUSE ELLIPTA  62.5 MCG/ACT 1 puff daily HLD  HTN : Continue home Hydrochlorothiazide  12.5mg  daily (1/2 tablet)  Osteoporosis Subclavian artery stenosis Tobacco use

## 2023-09-13 NOTE — Interval H&P Note (Signed)
 History and Physical Interval Note:  09/13/2023 11:01 AM  Kelly Snyder  has presented today for surgery, with the diagnosis of nstemi.  The various methods of treatment have been discussed with the patient and family. After consideration of risks, benefits and other options for treatment, the patient has consented to  Procedure(s): RIGHT/LEFT HEART CATH AND CORONARY ANGIOGRAPHY (N/A) and possible coronary angioplasty as a surgical intervention for NSTEMI.  The patient's history has been reviewed, patient examined, no change in status, stable for surgery.  I have reviewed the patient's chart and labs.  Questions were answered to the patient's satisfaction.     Gordy Bergamo

## 2023-09-13 NOTE — H&P (Addendum)
 Hospital Admission History and Physical Service Pager: [phone]  Patient name: Kelly Snyder Medical record number: [id number] Date of Birth: 1945-08-01 Age: 78 y.o. Gender: female  Primary Care Provider: Lonnie Earnest, MD Consultants: Cardiology Code Status: Full code Preferred Emergency Contact:  Contact Information     Name Relation Home Work Hansen Son [phone]  [phone]      Other Contacts   None on File      Chief Complaint: Nausea/vomiting  Assessment and Plan: Kelly Snyder is a 78 y.o. female presenting with vomiting. Differential for presentation of this includes NSTEMI (uptrending trops, EKG, tobacco use, N/V with diaphoresis), pulmonary embolism (chest pain, cough, tachycardia, less likely given uptrending trops and N/V), or GERD (nausea/vomiting, less likely given acute onset with diaphoresis, uptrending tropes and EKG findings).  Assessment & Plan NSTEMI (non-ST elevated myocardial infarction) (HCC) ACS (acute coronary syndrome) (HCC) Risk factors of tobacco use and subclavian artery stenosis but does not appear to have prior cardiac workup. EKG with T wave abnormalities in precordial lead but otherwise asymptomatic upon admission. Cards following and planning to cath in the AM. - Admit to FMTS, attending Dr. Donah - Cardiology consulted, planning PCI today - Continue Heparin  GTT - NPO - Nitroglycerin  prn chest pain - Echo - Ezetimibe  per Cards given statin intolerance, obtain AM lipid panel - ASA 81mg  daily - Progressive unit, Vital signs per floor, continuous cardiac monitoring - Fall precautions  Chronic and Stable Problems:  HTN: Hold home hydrochlorothiazide  6.25mg  daily COPD: Continue home Spiriva   FEN/GI: N.p.o. VTE Prophylaxis: on Heparin  GTT  Disposition: Progressive  History of Present Illness:  Kelly Snyder is a 78 y.o. female with past medical history of COPD presenting with nausea/vomiting with chest  pain  About 4PM, felt shortness of breath suddenly. Felt like it may have been her COPD acting up. Broke out in full body sweat and felt like skin was crawling. About half hour later felt very hot and had some N/V. Episode of emesis but could not get much up, mostly liquid. Called EMS, had another episode of emesis around 6PM. Chest pain started and felt like someone pushing on her chest. Did not radiate. Denies fever or recent sick symptoms or contacts.  Since that time has not had any further SOB or chest pain. Has not had any further N/V either.   In the ED, initial troponin was 594 EKG was reassuring, CBC and CMP were largely unremarkable lipase 28, repeat EKG and minutes later did find some T wave abnormalities in V1/V2 and borderline RAD repeat tropes were 1098.  Cardiology was consulted and plan to do PCI later today.  Review Of Systems: Per HPI  Pertinent Past Medical History: COPD HLD HTN Tobacco use Osteoporosis Subclavian artery stenosis Remainder reviewed in history tab.   Pertinent Past Surgical History: None pertinent Remainder reviewed in history tab.   Pertinent Social History: Tobacco use: Former, quit in 10/2018. Had smoked 50+ years, 1 pack/day Alcohol use: None Other Substance use: None Lives with son and his girlfriend  Pertinent Family History: Mother heart disease, diabetes Father heart disease Remainder reviewed in history tab.   Important Outpatient Medications: Albuterol  prn ASA 81mg  Multivitamin Hydrochlorothiazide  12.5mg  daily (1/2 tablet) Spiriva  2 puffs daily Remainder reviewed in medication history.   Objective: BP 127/75   Pulse (!) 109   Temp (!) 97.5 F (36.4 C) (Oral)   Resp 13   Ht 5' 2 (1.575 m)  Wt 65.3 kg   SpO2 98%   BMI 26.34 kg/m  Exam: General: Well appearing, resting comfortably Cardiovascular: RRR, no M/R/G Respiratory: CTA bilaterally, no wheezes or crackles Gastrointestinal: active bowel sounds, nontender, no  rebound or guarding MSK: calves non tender and non edematous Derm: dry, no rashes Psych: pleasant demeanor, reactive affect  Labs:  CBC BMET  Recent Labs  Lab 09/12/23 2110  WBC 11.1*  HGB 12.1  HCT 36.3  PLT 293   Recent Labs  Lab 09/12/23 2110  NA 134*  K 3.3*  CL 94*  CO2 27  BUN 8  CREATININE 0.72  GLUCOSE 132*  CALCIUM  8.9    Pertinent additional labs: Troponin: 594 > 1098 Lipase: 26 Glucose: 132 UA: 20 ketone  EKG: T wave inversions  Imaging Studies Performed: None obtained   Lorrane Pac, MD 09/13/2023, 1:34 AM PGY-1, [facility]  FPTS Intern pager: [phone], text pages welcome Secure chat group [facility] Teaching Service   Upper Level Addendum:   I have seen and evaluated this patient along with Dr. Lorrane and reviewed the above note, making necessary revisions as appropriate.  I agree with the medical decision making and physical exam as noted above.   Izetta Nap, DO PGY-2, [facility] Family Medicine Residency

## 2023-09-13 NOTE — ED Provider Notes (Signed)
 1:29 AM Spoke with Family Medicine service who will assess patient in the ED for admission.  The patient has been seen in consultation by Cardiology who plan to complete heart catheterization tomorrow.   Keith Sor, PA-C 09/13/23 0130    Raford Lenis, MD 09/13/23 [phone]

## 2023-09-13 NOTE — ED Notes (Signed)
 Pt is in no acute distress at this time. Denies any shob, chest pain, headache, or dizziness at this time. Pt calm, alert, and cooperative. She is sitting in the bed, scrolling on her phone at this time.

## 2023-09-13 NOTE — ED Provider Notes (Signed)
 [facility] EMERGENCY DEPARTMENT AT [facility] Provider Note   CSN: [id number] Arrival date & time: 09/12/23  2105     Patient presents with: Nausea and Emesis   Kelly Snyder is a 78 y.o. female who presents to the emergency department via [facility] with a chief complaint of nausea and vomiting. Patient states that at approximately 4 PM today she had a coughing spell and after that broke out in a sweat.  She also states that she became a little short of breath and had some chest pain.  She states that following this she had 1 episode of vomiting with another episode approximately an hour to an hour and a half later.  She denies hematemesis. Denies previous cardiac history.  Denies abdominal pain, diarrhea.  Patient states that she is compliant with her aspirin  daily, denies other blood thinning medication.  She describes the chest pain as nonexertional, and at time of my assessment denies any chest pain.  Past medical history significant for tobacco abuse, COPD, hyperlipidemia, osteoporosis, aortic atherosclerosis, subclavian artery stenosis, spinal stenosis of cervical region, hypertension, acid reflux, etc.    Emesis      Prior to Admission medications   Medication Sig Start Date End Date Taking? Authorizing Provider  acetaminophen  (TYLENOL ) 500 MG tablet Take 1,000 mg by mouth every 6 (six) hours as needed for moderate pain (pain).     [provider]  albuterol  (VENTOLIN  HFA) 108 (90 Base) MCG/ACT inhaler Inhale 2 puffs into the lungs every 6 (six) hours as needed for wheezing or shortness of breath.    [provider]  aspirin  81 MG chewable tablet Chew 1 tablet (81 mg total) by mouth daily. 01/16/23   Tharon Lung, MD  Calcium  Carb-Cholecalciferol (CALCIUM  600-D PO) Take 1 tablet by mouth daily.    [provider]  hydrochlorothiazide  (HYDRODIURIL ) 12.5 MG tablet TAKE 1/2 TABLET BY MOUTH DAILY 06/29/23   Joshua Domino, DO  Lidocaine  (ASPERCREME  MAX STRENGTH) 4 % AERO Apply 1 Application topically daily as needed (hip arthritis pain).    [provider]  Multiple Vitamin (MULTIVITAMIN WITH MINERALS) TABS tablet Take 1 tablet by mouth daily.    [provider]  Tiotropium Bromide  Monohydrate (SPIRIVA  RESPIMAT) 2.5 MCG/ACT AERS INHALE 2 PUFFS BY MOUTH INTO THE LUNGS DAILY 06/10/23   Joshua Domino, DO    Allergies: Prednisone , Tizanidine , Lipitor [atorvastatin ], and Crestor  [rosuvastatin ]    Review of Systems  Gastrointestinal:  Positive for vomiting.    Updated Vital Signs BP 127/75   Pulse (!) 109   Temp (!) 97.5 F (36.4 C) (Oral)   Resp 13   Ht 5' 2 (1.575 m)   Wt 65.3 kg   SpO2 98%   BMI 26.34 kg/m   Physical Exam Vitals and nursing note reviewed.  Constitutional:      General: She is awake. She is not in acute distress.    Appearance: Normal appearance. She is not ill-appearing, toxic-appearing or diaphoretic.  HENT:     Head: Normocephalic and atraumatic.  Eyes:     General: No scleral icterus. Cardiovascular:     Rate and Rhythm: Normal rate and regular rhythm.  Pulmonary:     Effort: Pulmonary effort is normal. No respiratory distress.     Breath sounds: Normal breath sounds. No wheezing, rhonchi or rales.  Abdominal:     General: Abdomen is flat.     Palpations: Abdomen is soft.     Tenderness: There is no  abdominal tenderness. There is no right CVA tenderness, left CVA tenderness, guarding or rebound.  Musculoskeletal:        General: Normal range of motion.     Right lower leg: No edema.     Left lower leg: No edema.  Skin:    General: Skin is warm.     Capillary Refill: Capillary refill takes less than 2 seconds.  Neurological:     General: No focal deficit present.     Mental Status: She is alert and oriented to person, place, and time.  Psychiatric:        Mood and Affect: Mood normal.        Behavior: Behavior normal. Behavior is cooperative.     (all labs ordered are  listed, but only abnormal results are displayed) Labs Reviewed  COMPREHENSIVE METABOLIC PANEL WITH GFR - Abnormal; Notable for the following components:      Result Value   Sodium 134 (*)    Potassium 3.3 (*)    Chloride 94 (*)    Glucose, Bld 132 (*)    All other components within normal limits  CBC - Abnormal; Notable for the following components:   WBC 11.1 (*)    All other components within normal limits  URINALYSIS, ROUTINE W REFLEX MICROSCOPIC - Abnormal; Notable for the following components:   Ketones, ur 20 (*)    All other components within normal limits  TROPONIN I (HIGH SENSITIVITY) - Abnormal; Notable for the following components:   Troponin I (High Sensitivity) 594 (*)    All other components within normal limits  TROPONIN I (HIGH SENSITIVITY) - Abnormal; Notable for the following components:   Troponin I (High Sensitivity) 1,098 (*)    All other components within normal limits  LIPASE, BLOOD  HEPARIN  LEVEL (UNFRACTIONATED)  CBC    EKG: EKG Interpretation Date/Time:  Monday September 12 2023 22:58:39 EDT Ventricular Rate:  95 PR Interval:  149 QRS Duration:  85 QT Interval:  376 QTC Calculation: 473 R Axis:   88  Text Interpretation: Sinus rhythm Borderline right axis deviation Borderline ST elevation, lateral leads no changes from last EKG Confirmed by Cleotilde Rogue ([id number]) on 09/12/2023 11:07:18 PM  Radiology: No results found.   .Critical Care  Performed by: Janetta Terrall FALCON, PA-C Authorized by: Janetta Terrall FALCON, PA-C   Critical care provider statement:    Critical care time (minutes):  30   Critical care was necessary to treat or prevent imminent or life-threatening deterioration of the following conditions:  Cardiac failure   Critical care was time spent personally by me on the following activities:  Development of treatment plan with patient or surrogate, discussions with consultants, evaluation of patient's response to treatment, examination of  patient, obtaining history from patient or surrogate, ordering and performing treatments and interventions, ordering and review of laboratory studies, re-evaluation of patient's condition and review of old charts   Care discussed with: admitting provider      Medications Ordered in the ED  heparin  ADULT infusion 100 units/mL (25000 units/250mL) (800 Units/hr Intravenous New Bag/Given 09/12/23 2318)  lactated ringers  bolus 500 mL (0 mLs Intravenous Stopped 09/13/23 0001)  aspirin  chewable tablet 324 mg (324 mg Oral Given 09/12/23 2307)  heparin  bolus via infusion 3,800 Units (3,800 Units Intravenous Bolus from Bag 09/12/23 2318)  Medical Decision Making Amount and/or Complexity of Data Reviewed Labs: ordered.  Risk OTC drugs. Prescription drug management. Decision regarding hospitalization.   Patient presents to the ED for concern of nausea, vomiting, chest pain, shortness of breath, this involves an extensive number of treatment options, and is a complaint that carries with it a high risk of complications and morbidity.  The differential diagnosis includes GERD, acute coronary syndrome, pneumonia, pancreatitis, cholecystitis, gastritis, COPD exacerbation, etc.   Co morbidities that complicate the patient evaluation  tobacco abuse, COPD, hyperlipidemia, osteoporosis, aortic atherosclerosis, subclavian artery stenosis, spinal stenosis of cervical region, hypertension, acid reflux   Additional history obtained:  Additional history obtained from EMS who gave Zofran    Lab Tests:  I Ordered, and personally interpreted labs.  The pertinent results include: CBC shows slightly elevated white blood cell count of 11.1, CMP shows slightly decreased sodium, potassium, chloride, initial troponin noted to be 594 with a repeat of 1098, urinalysis not consistent with infection   Cardiac Monitoring:  The patient was maintained on a cardiac monitor.  I personally  viewed and interpreted the cardiac monitored which showed an underlying rhythm of: Sinus rhythm Initial EKG confirmed by attending shows no acute ST segment elevation or acute ischemic changes by my interpretation Repeat EKG after troponin result shows no significant change from previous EKG, also confirmed by my attending   Medicines ordered and prescription drug management:  I ordered medication including fluids for possible dehydration after nausea and vomiting, aspirin , heparin  for acutely elevated troponin in the setting of chest pain Reevaluation of the patient after these medicines showed that the patient stayed the same I have reviewed the patients home medicines and have made adjustments as needed   Test Considered:  None   Critical Interventions:  Initial troponin noted to be elevated as well as repeat, immediately reached out to cardiology and patient started on heparin  infusion as well as aspirin    Consultations Obtained:  I requested consultation with the cardiology team,  and discussed lab and imaging findings as well as pertinent plan - they recommend: Admission for ongoing diagnosis and treatment   Problem List / ED Course:  78 year old female, vital signs stable, presents to the emergency department with a chief complaint of nausea and vomiting as well as an episode of shortness of breath and chest pain earlier today, patient now asymptomatic after receiving 4 mg of Zofran  via EMS On physical exam patient overall well-appearing currently asymptomatic, no obvious abnormality with auscultation of heart or lungs, patient denies chest pain Initial lab work reassuring other than elevated troponin of 594, repeat troponin 1098.  Repeat EKGs showed no acute ST segment elevation or acute ischemic changes by my interpretation, confirmed by my attending. After initial troponin elevation resulted patient immediately started on aspirin  as well as heparin , consulted cardiology Dr.  Orlando who recommends admission for ongoing diagnosis and treatment Most likely diagnosis at this time is possible NSTEMI as there were no acute ischemic changes or ST segment elevation on repeat EKGs today however uptrending troponin patient given aspirin  and started on heparin , cardiology consulted and plan for admission for ongoing diagnosis and treatment Patient ultimately admitted by Dr. Izetta Nap for ongoing diagnosis and treatment    Reevaluation:  After the interventions noted above, I reevaluated the patient and found that they have :stayed the same   Social Determinants of Health:  None   Dispostion:  After consideration of the diagnostic results and the patients response to treatment, I feel  that the patient would benefit from admission to the hospital for ongoing diagnosis and treatment     Final diagnoses:  NSTEMI (non-ST elevated myocardial infarction) (HCC)  Chest pain, unspecified type  Nausea and vomiting, unspecified vomiting type  Dyspnea, unspecified type    ED Discharge Orders     None          Janetta Terrall FALCON, PA-C 09/13/23 0125    Cleotilde Rogue, MD 09/16/23 [phone]

## 2023-09-14 DIAGNOSIS — I214 Non-ST elevation (NSTEMI) myocardial infarction: Secondary | ICD-10-CM | POA: Diagnosis not present

## 2023-09-14 LAB — BASIC METABOLIC PANEL WITH GFR
Anion gap: 12 (ref 5–15)
BUN: 7 mg/dL — ABNORMAL LOW (ref 8–23)
CO2: 24 mmol/L (ref 22–32)
Calcium: 8.6 mg/dL — ABNORMAL LOW (ref 8.9–10.3)
Chloride: 98 mmol/L (ref 98–111)
Creatinine, Ser: 0.78 mg/dL (ref 0.44–1.00)
GFR, Estimated: >60 mL/min (ref >60)
Glucose, Bld: 100 mg/dL — ABNORMAL HIGH (ref 70–99)
Potassium: 4 mmol/L (ref 3.5–5.1)
Sodium: 134 mmol/L — ABNORMAL LOW (ref 135–145)

## 2023-09-14 LAB — LACTIC ACID, PLASMA
Lactic Acid, Venous: 1.2 mmol/L (ref 0.5–1.9)
Lactic Acid, Venous: 2.3 mmol/L (ref 0.5–1.9)
Lactic Acid, Venous: 1.1 mmol/L (ref 0.5–1.9)

## 2023-09-14 LAB — MAGNESIUM: Magnesium: 2.1 mg/dL (ref 1.7–2.4)

## 2023-09-14 MED ORDER — FUROSEMIDE 20 MG PO TABS
20.0000 mg | ORAL_TABLET | Freq: Once | ORAL | Status: AC
  Administered 2023-09-14: 20 mg via ORAL
  Filled 2023-09-14: qty 1

## 2023-09-14 MED ORDER — LACTATED RINGERS IV BOLUS
500.0000 mL | Freq: Once | INTRAVENOUS | Status: AC
  Administered 2023-09-14: 500 mL via INTRAVENOUS

## 2023-09-14 NOTE — Progress Notes (Signed)
 Mobility Specialist Progress Note:    09/14/23 1700  Mobility  Activity Ambulated with assistance  Level of Assistance Standby assist, set-up cues, supervision of patient - no hands on  Assistive Device None  Distance Ambulated (ft) 100 ft  Activity Response Tolerated well  Mobility Referral Yes  Mobility visit 1 Mobility  Mobility Specialist Start Time (ACUTE ONLY) 1700  Mobility Specialist Stop Time (ACUTE ONLY) 1706  Mobility Specialist Time Calculation (min) (ACUTE ONLY) 6 min   Received pt in recliner pleasant and agreeable to session. No c/o any symptoms. Pt able to move and ambulate well. Returned pt to recliner w/ all needs met.   Venetia Keel Mobility Specialist Please Neurosurgeon or Rehab Office at [phone]

## 2023-09-14 NOTE — Progress Notes (Signed)
 Heart Failure Nurse Navigator Progress Note  PCP: Lonnie Earnest, MD PCP-Cardiologist: Patwardhan Admission Diagnosis: NSTEMI Admitted from: [facility] via [facility]  Presentation:   Kelly Snyder presented with shortness of breath, chest pain, Nausea and vomiting twice in the last 1.5 hours. Patient reported to feeling as if she was very hot. Patient reports that she is not very active as she has shortness of breath with moderate walking and is unable to walk up a flight of stairs due to knee pain . The patient can walk about 50 feet before getting short of breath. Per MD. Echocardiogram 9/9 with LVEF of 35 to 40%, grade 2 diastolic dysfunction, hypokinesis of LV, apical anterior, anterior septal wall, normal RV.Cardiac catheterization showed normal coronary arteries, 81 mmHg echocardiogram showing apical ballooning suggestive of stress-induced cardiomyopathy.   Patient was educated on the sign and symptoms of heart failure, daily weights, when ot call her doctor or go to the ED. Diet/ fluid restrictions, taking all her medications as prescribed and attending all medical appointments. Patient verbalized her understanding of all education. A HF TOC appointment was scheduled for 09/21/2023 @ 9 am.   ECHO/ LVEF: 35-40%  Clinical Course:  Past Medical History:  Diagnosis Date   Arthritis    COPD (chronic obstructive pulmonary disease) (HCC)    Pneumonia      Social History   Socioeconomic History   Marital status: Divorced    Spouse name: Not on file   Number of children: 1   Years of education: 12   Highest education level: 12th grade  Occupational History   Occupation: retired  Tobacco Use   Smoking status: Former    Current packs/day: 1.00    Average packs/day: 1 pack/day for 57.7 years (57.7 ttl pk-yrs)    Types: Cigarettes    Start date: 01/04/1966    Passive exposure: Past   Smokeless tobacco: Never   Tobacco comments:    quit 10/28/2018  Vaping Use   Vaping status: Never Used   Substance and Sexual Activity   Alcohol use: Not Currently   Drug use: Never   Sexual activity: Not Currently  Other Topics Concern   Not on file  Social History Narrative   Health Care POA: Emergency Contact: Garnette (son) [phone]       Patient lives with her son and grandson. Patient has dogs and cats.    Patient enjoys gardening and walking for exercise.    Patients sister has Parkinson's Disease. Patient enjoys spending time with her.          Social Drivers of Corporate investment banker Strain: Low Risk  (04/14/2023)   Overall Financial Resource Strain (CARDIA)    Difficulty of Paying Living Expenses: Not hard at all  Food Insecurity: No Food Insecurity (09/13/2023)   Hunger Vital Sign    Worried About Running Out of Food in the Last Year: Never true    Ran Out of Food in the Last Year: Never true  Transportation Needs: No Transportation Needs (09/13/2023)   PRAPARE - Administrator, Civil Service (Medical): No    Lack of Transportation (Non-Medical): No  Physical Activity: Sufficiently Active (04/14/2023)   Exercise Vital Sign    Days of Exercise per Week: 7 days    Minutes of Exercise per Session: 30 min  Stress: No Stress Concern Present (04/14/2023)   [employer] of Occupational Health - Occupational Stress Questionnaire    Feeling of Stress : Not at all  Social  Connections: Moderately Isolated (09/13/2023)   Social Connection and Isolation Panel    Frequency of Communication with Friends and Family: Twice a week    Frequency of Social Gatherings with Friends and Family: Once a week    Attends Religious Services: 1 to 4 times per year    Active Member of [employer] or Organizations: No    Attends Engineer, structural: Never    Marital Status: Divorced   Water engineer and Provision:  Detailed education and instructions provided on heart failure disease management including the following:  Signs and symptoms of Heart Failure When to call  the physician Importance of daily weights Low sodium diet Fluid restriction Medication management Anticipated future follow-up appointments  Patient education given on each of the above topics.  Patient acknowledges understanding via teach back method and acceptance of all instructions.  Education Materials:  Living Better With Heart Failure Booklet, HF zone tool, & Daily Weight Tracker Tool.  Patient has scale at home: Yes Patient has pill box at home: Yes    High Risk Criteria for Readmission and/or Poor Patient Outcomes: Heart failure hospital admissions (last 6 months): 0  No Show rate: 3% Difficult social situation: No Demonstrates medication adherence: Yes Primary Language: English Literacy level: Reading, writing, and comprehension.   Barriers of Care:   Diet/ fluid restrictions Daily weights  Considerations/Referrals:   Referral made to Heart Failure Pharmacist Stewardship: NA Referral made to Heart Failure CSW/NCM TOC: NA Referral made to Heart & Vascular TOC clinic: Yes, 09/21/2023 @ 10:30 am   Items for Follow-up on DC/TOC: Continued HF education Diet/ fluid restrictions/ daily weights   Stephane Haddock, BSN, RN Heart Failure Teacher, adult education Only

## 2023-09-14 NOTE — Progress Notes (Signed)
 CARDIAC REHAB PHASE I   PRE:  Rate/Rhythm: 83 SR    BP: sitting 105/56    SpO2: 97 RA  MODE:  Ambulation: 60 ft, 180 ft   POST:  Rate/Rhythm: 100 ST    BP: sitting 107/60     SpO2: 94 RA   Pt slowly ambulated. Did feel slightly lightheaded initially in hall. Returned to room after 60 ft, BP 107/60. She then was able to walk 180 more feet, slow pace. To recliner.  Discussed with pt takotsubo MI, restrictions, daily wts, low sodium, exercise, and CRPII. Pt receptive. We also discussed Pulmonary Rehab. Gave her HF booklet. Will refer to [facility] CRPII.  [phone]  Aliene Aris BS, ACSM-CEP 09/14/2023 11:34 AM

## 2023-09-14 NOTE — Plan of Care (Signed)
  Problem: Education: Goal: Knowledge of General Education information will improve Description: Including pain rating scale, medication(s)/side effects and non-pharmacologic comfort measures Outcome: Progressing   Problem: Health Behavior/Discharge Planning: Goal: Ability to manage health-related needs will improve Outcome: Progressing   Problem: Clinical Measurements: Goal: Ability to maintain clinical measurements within normal limits will improve Outcome: Progressing Goal: Will remain free from infection Outcome: Progressing Goal: Diagnostic test results will improve Outcome: Progressing Goal: Respiratory complications will improve Outcome: Progressing Goal: Cardiovascular complication will be avoided Outcome: Progressing   Problem: Activity: Goal: Risk for activity intolerance will decrease Outcome: Progressing   Problem: Nutrition: Goal: Adequate nutrition will be maintained Outcome: Progressing   Problem: Coping: Goal: Level of anxiety will decrease Outcome: Progressing   Problem: Elimination: Goal: Will not experience complications related to bowel motility Outcome: Progressing Goal: Will not experience complications related to urinary retention Outcome: Progressing   Problem: Pain Managment: Goal: General experience of comfort will improve and/or be controlled Outcome: Progressing   Problem: Safety: Goal: Ability to remain free from injury will improve Outcome: Progressing   Problem: Skin Integrity: Goal: Risk for impaired skin integrity will decrease Outcome: Progressing   Problem: Education: Goal: Understanding of CV disease, CV risk reduction, and recovery process will improve Outcome: Progressing   Problem: Activity: Goal: Ability to return to baseline activity level will improve Outcome: Progressing   Problem: Cardiovascular: Goal: Ability to achieve and maintain adequate cardiovascular perfusion will improve Outcome: Progressing

## 2023-09-14 NOTE — Discharge Summary (Shared)
 Family Medicine Teaching [facility] Discharge Summary  Patient name: Kelly Snyder Medical record number: [id number] Date of birth: April 01, 1945 Age: 78 y.o. Gender: female Date of Admission: 09/12/2023  Date of Discharge: 09/14/23 Admitting Physician: Donald CHRISTELLA Lai, DO  Primary Care Provider: Lonnie Earnest, MD Consultants: Cardiology  Indication for Hospitalization: NSTEMI  Discharge Diagnoses/Problem List:  Principal Problem for Admission:  Other Problems addressed during stay:  Principal Problem:   Stress induced Cardiomyopathy  Active Problems:   ACS (acute coronary syndrome) [facility])   Stress-induced cardiomyopathy  Brief Hospital Course:  Kelly Snyder is a 78 y.o.female with a history of  who was admitted to the [facility] Teaching Service at [facility] for NSTEMI. Her hospital course is detailed below:  Stress-induced cardiomyopathy In ED EKG with T wave abnormalities and significantly elevated troponin (max 1099). Cardiology was consulted with Cardiac catheterization performed on 9/9 which demonstrated normal coronary arteries w/  LV findings consistent with Takotsubo cardiomyopathy.  RHC revealing WHO group 2 mild PAH with preserved cardiac output and index and PAPi Normal RV fx.  TTE w/o LV thrombus w/ EF 35-40%.  Cardiology team recommended Metoprolol  succinate 12.5mg  daily, Losartan  12.5mg  daily, and PRN Lasix  20mg  daily at discharge and f/u w/ cardiology outpatient and repeat echo in 4 weeks.  Patient was stable and able to tolerate medications prior to discharge.  Hypotension Patient with sBP in 80-90, remained asymptomatic during hospitalized her heart rate has been in 70s-100s. She was able to ambulate w/o issues. Due to her hypotension, cardiology stopped Coreg  3.125 mg BID and losartan  25 mg daily. As her BP improved, cardiology restarted Metoprolol  succinate 12.5mg  daily, Losartan  12.5mg  daily, and PRN Lasix  20mg  daily at discharge.  Other chronic conditions were  medically managed with home medications and formulary alternatives as necessary   PCP Follow-up Recommendations: Follow up w/ HTN medication regarding hypotensive episode during hospitalization Needs repeat echocardiogram in 4 weeks (around 10/14/2023)   Results/Tests Pending at Time of Discharge:  Unresulted Labs (From admission, onward)     Start     Ordered   09/14/23 0500  Basic metabolic panel  Daily,   R      09/13/23 0915           Disposition: Home  Discharge Condition: Good  Discharge Exam:  Vitals:   09/15/23 0437 09/15/23 0832  BP: 119/61   Pulse: 83 90  Resp: 18 16  Temp: 97.7 F (36.5 C)   SpO2: 95% 95%   Cardiovascular:     Rate and Rhythm: Normal rate.     Pulses: Normal pulses.  Pulmonary:     Effort: Pulmonary effort is normal.     Breath sounds: Normal breath sounds.  Abdominal:     Palpations: Abdomen is soft.  Neurological:     Mental Status: She is oriented to person, place, and time. Mental status is at baseline.  Psychiatric:        Mood and Affect: Mood normal.        Behavior: Behavior normal.   Significant Procedures:  Cardiac Catheterization 09/13/23: Hemodynamic data: RA 11/0, mean 9 mmHg RV 41/7, EDP 9 mmHg PA 44/23, mean 32 mmHg PW 21/23, mean 20 mmHg. CO 4.58 and CI 2.43 by Fick.  QP/QS 1.0.  Papi 2.3, normal. Mild pulm hypertension with elevated EDP.  WHO group 2.   LV 117/7, EDP 31 mmHg.  Ao 103/25, mean 89 mmHg.  Markedly elevated EDP.   Angiographic data: LM: Calcified but widely  patent and normal lumen. LAD: Large D1, large caliber vessel, again diffuse periarterial calcification is evident without luminal irregularity. LCx: Large OM 2 and 3.  Again proximal coronary calcification without luminal obstruction is evident. RCA: Very large caliber vessel and a dominant vessel.  Proximal periarterial calcification without luminal obstruction again is evident.   LV: Mid to distal anterior, apical, apical inferior akinesis  suggestive of Takotsubo cardiomyopathy.      Impression and recommendations: Normal coronary arteries with periarterial coronary calcification, right dominant circulation.  LV findings consistent with Takotsubo cardiomyopathy.  Right heart catheterization revealing WHO group 2 mild PAH with preserved cardiac output and index and PAPi suggesting normal RV function.   Significant Labs and Imaging:  No results for input(s): WBC, HGB, HCT, PLT in the last 48 hours.  Recent Labs  Lab 09/14/23 0244 09/15/23 0304  NA 134* 135  K 4.0 3.9  CL 98 99  CO2 24 27  GLUCOSE 100* 95  BUN 7* 8  CREATININE 0.78 0.71  CALCIUM  8.6* 8.4*  MG 2.1  --    Discharge Medications:  Allergies as of 09/15/2023       Reactions   Prednisone  Itching, Rash   Patient broke out in a full body rash   Tizanidine  Itching, Rash   Patient broke out in a full body rash   Lipitor [atorvastatin ] Other (See Comments)   Muscle/joint pain   Crestor  [rosuvastatin ] Rash        Medication List     STOP taking these medications    hydrochlorothiazide  12.5 MG tablet Commonly known as: HYDRODIURIL        TAKE these medications    acetaminophen  500 MG tablet Commonly known as: TYLENOL  Take 1,000 mg by mouth every 6 (six) hours as needed for moderate pain (pain).   albuterol  108 (90 Base) MCG/ACT inhaler Commonly known as: VENTOLIN  HFA Inhale 2 puffs into the lungs every 6 (six) hours as needed for wheezing or shortness of breath.   Aspercreme Max Strength 4 % Aero Generic drug: Lidocaine  Apply 1 Application topically daily as needed (hip arthritis pain).   aspirin  81 MG chewable tablet Chew 1 tablet (81 mg total) by mouth daily.   CALCIUM  600-D PO Take 1 tablet by mouth daily.   ezetimibe  10 MG tablet Commonly known as: ZETIA  Take 1 tablet (10 mg total) by mouth daily.   furosemide  20 MG tablet Commonly known as: LASIX  Take 1 tablet (20 mg total) by mouth daily.   losartan  25 MG  tablet Commonly known as: COZAAR  Take 0.5 tablets (12.5 mg total) by mouth daily.   metoprolol  succinate 25 MG 24 hr tablet Commonly known as: TOPROL -XL Take 0.5 tablets (12.5 mg total) by mouth daily.   multivitamin with minerals Tabs tablet Take 1 tablet by mouth daily.   nitroGLYCERIN  0.4 MG SL tablet Commonly known as: NITROSTAT  Place 1 tablet (0.4 mg total) under the tongue every 5 (five) minutes as needed for chest pain.   pravastatin  20 MG tablet Commonly known as: PRAVACHOL  Take 1 tablet (20 mg total) by mouth daily at 6 PM.   Spiriva  Respimat 2.5 MCG/ACT Aers Generic drug: Tiotropium Bromide  Monohydrate INHALE 2 PUFFS BY MOUTH INTO THE LUNGS DAILY        Discharge Instructions: Please refer to Patient Instructions section of EMR for full details.  Patient was counseled important signs and symptoms that should prompt return to medical care, changes in medications, dietary instructions, activity restrictions, and follow up appointments.  Follow-Up Appointments:  Follow-up Information     [facility]. Go in 6 day(s).   Specialty: Cardiology Why: Hospital follow up 09/21/2023 @ 9 am PLEASE bring a current medication list to appointment FREE valet parking, Entrance C, off [employer] [address] and [employer] entrance. Contact information: [address] [place]  [phone]        [facility] Dept Of [place]. [facility]. Go on 09/19/2023.   Specialty: Family Medicine Why: at 9:30 AM Contact information: [address] [place]  [phone] Additional information: [address]  [place], [place] [address]                Suzen Houston NOVAK, DO 09/15/2023, 11:59 AM PGY-1, [facility] Family Medicine   I have discussed the above with Dr. Coralee and agree with the documented plan. My edits for  correction/addition/clarification are included above. Please see any attending notes.   Kathrine Melena, DO PGY-2,  Family Medicine 09/15/2023 1:35 PM

## 2023-09-14 NOTE — Assessment & Plan Note (Addendum)
 Risk factors of tobacco use and subclavian artery stenosis. In ED EKG with T wave abnormalities and significantly elevated Troponin. Cardiac catheterization on 9/9 show Normal coronary arteries w/  LV findings consistent with Takotsubo cardiomyopathy.  RHC revealing WHO group 2 mild PAH with preserved cardiac output and index and PAPi Normal RV fx. TTE w/o LV thrombus w/ EF 35-40%  - Cardiology consulted and follow appreciated recommendation                     - Initially started on Coreg  3.125 mg twice daily,                        losartan  25 mg daily but stopped by cardiology d/t                      low BP in AM                   -  Reassess BP, will consider adding losartan  12.5 mg                       daily later today versus tomorrow                    - Check lactic acid given soft BP pending result - Regular diet  - Nitroglycerin  prn chest pain - ASA 81mg  daily - Fall precautions

## 2023-09-14 NOTE — Progress Notes (Signed)
  Progress Note  Patient Name: Kelly Snyder Date of Encounter: 09/14/2023 [facility] Cardiologist: Newman JINNY Lawrence, MD   Interval Summary    No complaints this morning. Sitting up in bed.   Vital Signs Vitals:   09/14/23 0215 09/14/23 0400 09/14/23 0730 09/14/23 0931  BP: 90/60 (!) 94/47 106/64 (!) 92/50  Pulse:  82 78   Resp: 20 18 17    Temp:  98.1 F (36.7 C) 97.7 F (36.5 C)   TempSrc:  Oral Oral   SpO2: 98% 95% 100%   Weight:      Height:       No intake or output data in the 24 hours ending 09/14/23 0957    09/13/2023    3:08 PM 09/12/2023    9:10 PM 08/30/2023   10:26 AM  Last 3 Weights  Weight (lbs) 153 lb 3.5 oz 144 lb 144 lb 12.8 oz  Weight (kg) 69.5 kg 65.318 kg 65.681 kg      Telemetry/ECG  Sinus Rhythm, brief NSVT  - Personally Reviewed  Physical Exam  GEN: No acute distress.   Neck: No JVD Cardiac: RRR, no murmurs, rubs, or gallops.  Respiratory: mild crackles in bases  GI: Soft, nontender, non-distended  MS: No edema  Assessment & Plan   78 y.o. female with a hx of hyperlipidemia, COPD, hypertension who was seen 09/12/2023 for the evaluation of chest pain at the request of [facility].   NSTEMI Takotsubo cardiomyopathy -- presented with chest pain/shortness breath.  High-sensitivity troponin peaked at 1098.  Underwent cardiac catheterization 9/9 with no coronary disease. -- Echocardiogram 9/9 with LVEF of 35 to 40%, grade 2 diastolic dysfunction, hypokinesis of LV, apical anterior, anterior septal wall, normal RV -- Initially started on Coreg  3.125 mg twice daily, losartan  25 mg daily, pressures are low today.  Therefore we will stop -- CO 4.58, CI 2.43, LVEDP 31 mmHg, Lasix  20 mg p.o. x 1 today -- check lactic acid given soft BP, Na + actually improved  -- Reassess BP, will consider adding losartan  12.5 mg daily later today versus tomorrow -- needs to ambulate today  Hyperlipidemia -- On pravastatin  20 mg daily, Zetia  10 mg  daily  COPD -- on Incruse PTA  For questions or updates, please contact [facility] Please consult [email or web address] for contact info under       Signed, Manuelita Rummer, NP

## 2023-09-14 NOTE — Plan of Care (Signed)
  Problem: Education: Goal: Knowledge of General Education information will improve Description: Including pain rating scale, medication(s)/side effects and non-pharmacologic comfort measures Outcome: Progressing   Problem: Health Behavior/Discharge Planning: Goal: Ability to manage health-related needs will improve Outcome: Progressing   Problem: Clinical Measurements: Goal: Will remain free from infection Outcome: Progressing Goal: Respiratory complications will improve Outcome: Progressing Goal: Cardiovascular complication will be avoided Outcome: Progressing   Problem: Activity: Goal: Risk for activity intolerance will decrease Outcome: Progressing   Problem: Coping: Goal: Level of anxiety will decrease Outcome: Progressing   Problem: Elimination: Goal: Will not experience complications related to bowel motility Outcome: Progressing Goal: Will not experience complications related to urinary retention Outcome: Progressing   Problem: Safety: Goal: Ability to remain free from injury will improve Outcome: Progressing   Problem: Cardiovascular: Goal: Ability to achieve and maintain adequate cardiovascular perfusion will improve Outcome: Progressing Goal: Vascular access site(s) Level 0-1 will be maintained Outcome: Progressing

## 2023-09-14 NOTE — Progress Notes (Addendum)
 Daily Progress Note Intern Pager: [phone]  Patient name: Kelly Snyder Medical record number: [id number] Date of birth: April 13, 1945 Age: 78 y.o. Gender: female  Primary Care Provider: Lonnie Earnest, MD Consultants: Cardiology Code Status: Full  Pt Overview and Major Events to Date:  09/13/23 : Admitted to FMTS service  09/14/23 : Cardiac catheterization and TTE   Pertinent PMH/PSH includes COPD HLD HTN Tobacco use Osteoporosis Subclavian artery stenosis   Assessment and Plan Kelly Snyder is a 78 y.o. female with past medical history of COPD,HTN, HLD, and Subclavian artery stenosis BIB EMS w/ c/o nausea/vomiting ED worked up c/o NSTEMI Assessment & Plan NSTEMI (non-ST elevated myocardial infarction) (HCC) ACS (acute coronary syndrome) (HCC) Risk factors of tobacco use and subclavian artery stenosis. In ED EKG with T wave abnormalities and significantly elevated Troponin. Cardiac catheterization on 9/9 show Normal coronary arteries w/  LV findings consistent with Takotsubo cardiomyopathy.  RHC revealing WHO group 2 mild PAH with preserved cardiac output and index and PAPi Normal RV fx. TTE w/o LV thrombus w/ EF 35-40%  - Cardiology consulted and follow appreciated recommendation                     - Initially started on Coreg  3.125 mg twice daily,                        losartan  25 mg daily but stopped by cardiology d/t                      low BP in AM                   -  Reassess BP, will consider adding losartan  12.5 mg                       daily later today versus tomorrow                    - Check lactic acid given soft BP pending result - Regular diet  - Nitroglycerin  prn chest pain - ASA 81mg  daily - Fall precautions Chronic health problem COPD: Home Albuterol  prn, Spiriva  2 puffs daily. Continue INCRUSE ELLIPTA  62.5 MCG/ACT 1 puff daily HLD  HTN : Continue home Hydrochlorothiazide  12.5mg  daily (1/2 tablet)  Osteoporosis Subclavian artery  stenosis Tobacco use  FEN/GI: Regular diet, Last BM 09/12/23 PPx: Heparin  gtt Dispo:pending   Subjective:  Kelly Snyder is a 78 y.o. female admitted for NSTEMI. Hypotensive OVN - asymptomatic otherwise doing well this morning. Patient sat at bedside for breakfast and stating she is very hungry.   Objective: Temp:  [97.5 F (36.4 C)-98.4 F (36.9 C)] 97.7 F (36.5 C) (09/10 0730) Pulse Rate:  [0-109] 78 (09/10 0730) Resp:  [10-24] 17 (09/10 0730) BP: (79-144)/(47-99) 106/64 (09/10 0730) SpO2:  [91 %-100 %] 100 % (09/10 0730) Weight:  [69.5 kg] 69.5 kg (09/09 1508)  Physical Exam Cardiovascular:     Rate and Rhythm: Normal rate.     Pulses: Normal pulses.  Pulmonary:     Effort: Pulmonary effort is normal.     Breath sounds: Normal breath sounds.  Abdominal:     Palpations: Abdomen is soft.  Neurological:     Mental Status: She is oriented to person, place, and time. Mental status is at baseline.  Psychiatric:        Mood and Affect:  Mood normal.        Behavior: Behavior normal.      Laboratory: Most recent CBC Lab Results  Component Value Date   WBC 9.4 09/13/2023   HGB 10.9 (L) 09/13/2023   HCT 32.0 (L) 09/13/2023   MCV 90.3 09/13/2023   PLT 308 09/13/2023   Most recent BMP    Latest Ref Rng & Units 09/14/2023    2:44 AM  BMP  Glucose 70 - 99 mg/dL 899   BUN 8 - 23 mg/dL 7   Creatinine 9.55 - 8.99 mg/dL 9.21   Sodium 864 - 854 mmol/L 134   Potassium 3.5 - 5.1 mmol/L 4.0   Chloride 98 - 111 mmol/L 98   CO2 22 - 32 mmol/L 24   Calcium  8.9 - 10.3 mg/dL 8.6     Troponin : 8901  Imaging/Diagnostic Tests: RIGHT/LEFT HEART CATH AND CORONARY ANGIOGRAPHY  Impression and recommendations: Normal coronary arteries with periarterial coronary calcification, right dominant circulation.  LV findings consistent with Takotsubo cardiomyopathy.  Right heart catheterization revealing WHO group 2 mild PAH with preserved cardiac output and index and PAPi suggesting  normal RV function.  ECHOCARDIOGRAM REPORT FINDINGS  Left Ventricle: No left ventricular thrombus is seen (Definity  contrast  was used). Left ventricular ejection fraction, by estimation, is 35 to  40%. The left ventricle has moderately decreased function. The left  ventricle demonstrates regional wall  motion abnormalities. Severe hypokinesis of the left ventricular,  mid-apical anterior wall, apical segment and anteroseptal wall. The left  ventricular internal cavity size was normal in size. There is no left  ventricular hypertrophy. Left ventricular  diastolic parameters are consistent with Grade II diastolic dysfunction  (pseudonormalization). Elevated left atrial pressure.   Suzen Houston NOVAK, DO 09/14/2023, 8:40 AM  PGY-1, [facility] Family Medicine FPTS Intern pager: [phone], text pages welcome Secure chat group [facility] Teaching Service

## 2023-09-14 NOTE — Assessment & Plan Note (Signed)
 COPD: Home Albuterol  prn, Spiriva  2 puffs daily. Continue INCRUSE ELLIPTA  62.5 MCG/ACT 1 puff daily HLD  HTN : Continue home Hydrochlorothiazide  12.5mg  daily (1/2 tablet)  Osteoporosis Subclavian artery stenosis Tobacco use

## 2023-09-15 ENCOUNTER — Encounter (HOSPITAL_COMMUNITY): Payer: Self-pay | Admitting: Family Medicine

## 2023-09-15 ENCOUNTER — Other Ambulatory Visit (HOSPITAL_COMMUNITY): Payer: Self-pay

## 2023-09-15 DIAGNOSIS — I5181 Takotsubo syndrome: Secondary | ICD-10-CM

## 2023-09-15 LAB — BASIC METABOLIC PANEL WITH GFR
Anion gap: 9 (ref 5–15)
BUN: 8 mg/dL (ref 8–23)
CO2: 27 mmol/L (ref 22–32)
Calcium: 8.4 mg/dL — ABNORMAL LOW (ref 8.9–10.3)
Chloride: 99 mmol/L (ref 98–111)
Creatinine, Ser: 0.71 mg/dL (ref 0.44–1.00)
GFR, Estimated: >60 mL/min (ref >60)
Glucose, Bld: 95 mg/dL (ref 70–99)
Potassium: 3.9 mmol/L (ref 3.5–5.1)
Sodium: 135 mmol/L (ref 135–145)

## 2023-09-15 LAB — LIPOPROTEIN A (LPA): Lipoprotein (a): 34.2 nmol/L — ABNORMAL HIGH (ref <75.0)

## 2023-09-15 MED ORDER — LOSARTAN POTASSIUM 25 MG PO TABS
12.5000 mg | ORAL_TABLET | Freq: Every day | ORAL | 0 refills | Status: DC
  Filled 2023-09-15: qty 30, 60d supply, fill #0

## 2023-09-15 MED ORDER — FUROSEMIDE 20 MG PO TABS
20.0000 mg | ORAL_TABLET | Freq: Every day | ORAL | Status: DC
  Administered 2023-09-15: 20 mg via ORAL
  Filled 2023-09-15: qty 1

## 2023-09-15 MED ORDER — METOPROLOL SUCCINATE ER 25 MG PO TB24
12.5000 mg | ORAL_TABLET | Freq: Every day | ORAL | 0 refills | Status: DC
  Filled 2023-09-15: qty 30, 60d supply, fill #0

## 2023-09-15 MED ORDER — NITROGLYCERIN 0.4 MG SL SUBL
0.4000 mg | SUBLINGUAL_TABLET | SUBLINGUAL | 0 refills | Status: AC | PRN
  Filled 2023-09-15: qty 25, 1d supply, fill #0

## 2023-09-15 MED ORDER — LOSARTAN POTASSIUM 25 MG PO TABS
12.5000 mg | ORAL_TABLET | Freq: Every day | ORAL | Status: DC
  Administered 2023-09-15: 12.5 mg via ORAL
  Filled 2023-09-15: qty 1

## 2023-09-15 MED ORDER — FUROSEMIDE 20 MG PO TABS
20.0000 mg | ORAL_TABLET | Freq: Every day | ORAL | 0 refills | Status: DC
  Filled 2023-09-15: qty 30, 30d supply, fill #0

## 2023-09-15 MED ORDER — ASPIRIN 81 MG PO CHEW
81.0000 mg | CHEWABLE_TABLET | Freq: Every day | ORAL | Status: DC
  Administered 2023-09-15: 81 mg via ORAL
  Filled 2023-09-15: qty 1

## 2023-09-15 MED ORDER — PRAVASTATIN SODIUM 20 MG PO TABS
20.0000 mg | ORAL_TABLET | Freq: Every day | ORAL | 0 refills | Status: DC
  Filled 2023-09-15: qty 30, 30d supply, fill #0

## 2023-09-15 MED ORDER — EZETIMIBE 10 MG PO TABS
10.0000 mg | ORAL_TABLET | Freq: Every day | ORAL | 0 refills | Status: DC
  Filled 2023-09-15: qty 30, 30d supply, fill #0

## 2023-09-15 MED ORDER — METOPROLOL SUCCINATE ER 25 MG PO TB24
12.5000 mg | ORAL_TABLET | Freq: Every day | ORAL | Status: DC
  Administered 2023-09-15: 12.5 mg via ORAL
  Filled 2023-09-15: qty 1

## 2023-09-15 NOTE — TOC Transition Note (Signed)
 Transition of Care [facility]) - Discharge Note   Patient Details  Name: Kelly Snyder MRN: [id number] Date of Birth: [birth year]  Transition of [facility]) CM/SW Contact:  Waddell Barnie Rama, RN Phone Number: 09/15/2023, 10:46 AM   Clinical Narrative:    For dc, has no needs.         Patient Goals and CMS Choice            Discharge Placement                       Discharge Plan and Services Additional resources added to the After Visit Summary for                                       Social Drivers of Health (SDOH) Interventions SDOH Screenings   Food Insecurity: No Food Insecurity (09/13/2023)  Housing: Low Risk  (09/13/2023)  Transportation Needs: No Transportation Needs (09/13/2023)  Utilities: Not At Risk (09/13/2023)  Alcohol Screen: Low Risk  (04/14/2023)  Depression (PHQ2-9): Low Risk  (04/25/2023)  Financial Resource Strain: Low Risk  (04/14/2023)  Physical Activity: Sufficiently Active (04/14/2023)  Social Connections: Moderately Isolated (09/13/2023)  Stress: No Stress Concern Present (04/14/2023)  Tobacco Use: Medium Risk (04/25/2023)  Health Literacy: Adequate Health Literacy (04/14/2023)     Readmission Risk Interventions    09/13/2023    3:36 PM  Readmission Risk Prevention Plan  Post Dischage Appt Complete  Medication Screening Complete  Transportation Screening Complete

## 2023-09-15 NOTE — Progress Notes (Addendum)
 Daily Progress Note Intern Pager: [phone]  Patient name: Kelly Snyder Medical record number: [id number] Date of birth: 22-May-1945 Age: 78 y.o. Gender: female  Primary Care Provider: Lonnie Earnest, MD Consultants: Cardiology Code Status: Full  Pt Overview and Major Events to Date:  09/13/23 : Admitted to FMTS service  09/14/23 : Cardiac catheterization and TTE showin likely takotsubo   Assessment and Plan Kelly Snyder is a 78 y.o. female with past medical history of COPD,HTN, HLD, and Subclavian artery stenosis BIB EMS w/ c/o nausea/vomiting ED worked up c/o NSTEMI Assessment & Plan Stress-induced cardiomyopathy Risk factors of tobacco use and subclavian artery stenosis. In ED EKG with T wave abnormalities and significantly elevated Troponin. Cardiac catheterization on 9/9 show Normal coronary arteries w/  LV findings consistent with Takotsubo cardiomyopathy.  RHC revealing WHO group 2 mild PAH with preserved cardiac output and index and PAPi Normal RV fx. TTE w/o LV thrombus w/ EF 35-40%  - Cardiology consulted and follow appreciated recommendation                     - Initially started on Coreg  3.125 mg twice daily,                        losartan  25 mg daily but stopped d/t low BP in AM                   -  Recommend oral Lasix  20 mg daily given mild renal send elevated LVEDP.                   -  Recommend Losartan  12.5 mg daily,+/-  metoprolol  succinate 12.5 mg daily starting today                   -  Anticipate discharge home today                   -  Outpatient follow-up and repeat echocardiogram in 4 weeks - Regular diet  - Nitroglycerin  prn chest pain - Continue ASA 81mg  daily - Fall precautions  Chronic health problem COPD: Home Albuterol  prn, Spiriva  2 puffs daily. Continue INCRUSE ELLIPTA  62.5 MCG/ACT 1 puff daily HLD : Provastatin 20 mg Daily  HTN : Continue home Hydrochlorothiazide  12.5mg  daily (1/2 tablet)  Osteoporosis Subclavian artery  stenosis Tobacco use  FEN/GI: Heart diet, Last BM 09/12/23 PPx: SQH Dispo:pending   Subjective:  Kelly Snyder is a 78 y.o. female admitted for NSTEMI s/p heart cath on 09/13/23 which found to have stress induced cardiomyopathy. Patient has been doing well. Hypotensive sBP in 80s yesterday. However her BP has been improved. OOB to bedside having breakfast. State she feels much better this morning  Objective: Temp:  [97.7 F (36.5 C)-98.4 F (36.9 C)] 97.7 F (36.5 C) (09/11 0437) Pulse Rate:  [83-95] 90 (09/11 0832) Resp:  [16-20] 16 (09/11 0832) BP: (92-134)/(34-70) 119/61 (09/11 0437) SpO2:  [91 %-97 %] 95 % (09/11 9167)  Physical Exam Cardiovascular:     Rate and Rhythm: Normal rate.     Pulses: Normal pulses.  Pulmonary:     Effort: Pulmonary effort is normal.     Breath sounds: Normal breath sounds.  Abdominal:     Palpations: Abdomen is soft.  Neurological:     Mental Status: She is oriented to person, place, and time. Mental status is at baseline.  Psychiatric:  Mood and Affect: Mood normal.        Behavior: Behavior normal.      Laboratory: Most recent CBC Lab Results  Component Value Date   WBC 9.4 09/13/2023   HGB 10.9 (L) 09/13/2023   HCT 32.0 (L) 09/13/2023   MCV 90.3 09/13/2023   PLT 308 09/13/2023   Most recent BMP    Latest Ref Rng & Units 09/15/2023    3:04 AM  BMP  Glucose 70 - 99 mg/dL 95   BUN 8 - 23 mg/dL 8   Creatinine 9.55 - 8.99 mg/dL 9.28   Sodium 864 - 854 mmol/L 135   Potassium 3.5 - 5.1 mmol/L 3.9   Chloride 98 - 111 mmol/L 99   CO2 22 - 32 mmol/L 27   Calcium  8.9 - 10.3 mg/dL 8.4     Troponin : 8901  Imaging/Diagnostic Tests: RIGHT/LEFT HEART CATH AND CORONARY ANGIOGRAPHY  Impression and recommendations: Normal coronary arteries with periarterial coronary calcification, right dominant circulation.  LV findings consistent with Takotsubo cardiomyopathy.  Right heart catheterization revealing WHO group 2 mild PAH with  preserved cardiac output and index and PAPi suggesting normal RV function.  ECHOCARDIOGRAM REPORT FINDINGS  Left Ventricle: No left ventricular thrombus is seen (Definity  contrast  was used). Left ventricular ejection fraction, by estimation, is 35 to  40%. The left ventricle has moderately decreased function. The left  ventricle demonstrates regional wall  motion abnormalities. Severe hypokinesis of the left ventricular,  mid-apical anterior wall, apical segment and anteroseptal wall. The left  ventricular internal cavity size was normal in size. There is no left  ventricular hypertrophy. Left ventricular  diastolic parameters are consistent with Grade II diastolic dysfunction  (pseudonormalization). Elevated left atrial pressure.   Suzen Houston NOVAK, DO 09/15/2023, 9:16 AM  PGY-1, [facility] Family Medicine FPTS Intern pager: [phone], text pages welcome Secure chat group [facility] Teaching Service

## 2023-09-15 NOTE — Assessment & Plan Note (Addendum)
 COPD: Home Albuterol  prn, Spiriva  2 puffs daily. Continue INCRUSE ELLIPTA  62.5 MCG/ACT 1 puff daily HLD : Provastatin 20 mg Daily  HTN : Continue home Hydrochlorothiazide  12.5mg  daily (1/2 tablet)  Osteoporosis Subclavian artery stenosis Tobacco use

## 2023-09-15 NOTE — Progress Notes (Signed)
  Progress Note  Patient Name: Kelly Snyder Date of Encounter: 09/15/2023 [facility] Cardiologist: Newman JINNY Lawrence, MD   Interval Summary    No issues overnight, hopeful for DC today. No chest pain  Vital Signs Vitals:   09/14/23 2009 09/15/23 0005 09/15/23 0437 09/15/23 0832  BP: 134/68 123/65 119/61   Pulse: 95 90 83 90  Resp: 18 20 18 16   Temp: 98.4 F (36.9 C) 98.1 F (36.7 C) 97.7 F (36.5 C)   TempSrc: Oral Oral Oral   SpO2: 97% 96% 95% 95%  Weight:      Height:        Intake/Output Summary (Last 24 hours) at 09/15/2023 0952 Last data filed at 09/15/2023 9077 Gross per 24 hour  Intake 480 ml  Output --  Net 480 ml      09/13/2023    3:08 PM 09/12/2023    9:10 PM 08/30/2023   10:26 AM  Last 3 Weights  Weight (lbs) 153 lb 3.5 oz 144 lb 144 lb 12.8 oz  Weight (kg) 69.5 kg 65.318 kg 65.681 kg      Telemetry/ECG   Sinus Rhythm with some intermittent tachycardia - Personally Reviewed  Physical Exam  GEN: No acute distress.   Neck: No JVD Cardiac: RRR, no murmurs, rubs, or gallops.  Respiratory: Clear to auscultation bilaterally. GI: Soft, nontender, non-distended  MS: No edema  Assessment & Plan   78 y.o. female with a hx of hyperlipidemia, COPD, hypertension who was seen 09/12/2023 for the evaluation of chest pain at the request of [facility].    NSTEMI Takotsubo cardiomyopathy -- presented with chest pain/shortness breath.  High-sensitivity troponin peaked at 1098.  Underwent cardiac catheterization 9/9 with no coronary disease. -- Echocardiogram 9/9 with LVEF of 35 to 40%, grade 2 diastolic dysfunction, hypokinesis of LV, apical anterior, anterior septal wall, normal RV -- CO 4.58, CI 2.43, LVEDP 31 mmHg -- Initially started on Coreg  3.125 mg twice daily, losartan  25 mg daily 9/9, pressures dropped. Stable today therefore will add Toprol  12.5mg  daily, losartan  12.5mg  daily. Follow BP this morning, if stable ok to dc later today. PRN  lasix  20mg  daily at discharge    Hyperlipidemia -- On pravastatin  20 mg daily, Zetia  10 mg daily -- reports issues with atorvastatin  and crestor  in the past    COPD -- on Incruse PTA  Will arrange outpatient follow up in the office  For questions or updates, please contact [facility] Please consult [email or web address] for contact info under       Signed, Manuelita Rummer, NP

## 2023-09-15 NOTE — Assessment & Plan Note (Addendum)
 Risk factors of tobacco use and subclavian artery stenosis. In ED EKG with T wave abnormalities and significantly elevated Troponin. Cardiac catheterization on 9/9 show Normal coronary arteries w/  LV findings consistent with Takotsubo cardiomyopathy.  RHC revealing WHO group 2 mild PAH with preserved cardiac output and index and PAPi Normal RV fx. TTE w/o LV thrombus w/ EF 35-40%  - Cardiology consulted and follow appreciated recommendation                     - Initially started on Coreg  3.125 mg twice daily,                        losartan  25 mg daily but stopped d/t low BP in AM                   -  Recommend oral Lasix  20 mg daily given mild renal send elevated LVEDP.                   -  Recommend Losartan  12.5 mg daily,+/-  metoprolol  succinate 12.5 mg daily starting today                   -  Anticipate discharge home today                   -  Outpatient follow-up and repeat echocardiogram in 4 weeks - Regular diet  - Nitroglycerin  prn chest pain - Continue ASA 81mg  daily - Fall precautions

## 2023-09-15 NOTE — Discharge Instructions (Addendum)
 Dear Kelly Snyder,  Thank you for letting us  participate in your care. You were hospitalized for acute chest pain and diagnosed with NSTEMI (non-ST elevated myocardial infarction) (HCC). You were treated with cardiac catheterization which showed cardiomyopathy.  Cardiology was consulted and recommended medication changes for you as follows: **  POST-HOSPITAL & CARE INSTRUCTIONS Follow-up with cardiology Take medications as prescribed Go to your follow up appointments (listed below)   DOCTOR'S APPOINTMENT   Future Appointments  Date Time Provider Department Center  09/19/2023  9:30 AM Jerrie Gathers, DO [facility]  09/21/2023  9:00 AM [facility] HEART IMPACT CLINIC [facility] None  04/16/2024 11:50 AM [facility] ANNUAL WELLNESS VISIT [facility]    Follow-up Information     [facility]. Go in 6 day(s).   Specialty: Cardiology Why: Hospital follow up 09/21/2023 @ 9 am PLEASE bring a current medication list to appointment FREE valet parking, Entrance C, off [employer] [address] [employer] entrance. Contact information: [address] [place]  [phone]        [facility] [place]. [facility]. Go on 09/19/2023.   Specialty: Family Medicine Why: at 9:30 AM Contact information: [address] [place]  [address] [phone] Additional information: [address] [place], [place] [address]                Take care and be well!  Family Medicine Teaching Service Inpatient Team [place]  [facility]  [address] [place], [place] [address] [phone]

## 2023-09-16 ENCOUNTER — Other Ambulatory Visit (HOSPITAL_COMMUNITY): Payer: Self-pay

## 2023-09-16 ENCOUNTER — Telehealth (HOSPITAL_COMMUNITY): Payer: Self-pay

## 2023-09-16 ENCOUNTER — Emergency Department (HOSPITAL_COMMUNITY)
Admission: EM | Admit: 2023-09-16 | Discharge: 2023-09-16 | Disposition: A | Discharge status: Home / Self Care | Attending: Emergency Medicine | Admitting: Emergency Medicine

## 2023-09-16 DIAGNOSIS — L03114 Cellulitis of left upper limb: Secondary | ICD-10-CM | POA: Diagnosis not present

## 2023-09-16 DIAGNOSIS — Z7982 Long term (current) use of aspirin: Secondary | ICD-10-CM | POA: Insufficient documentation

## 2023-09-16 DIAGNOSIS — T827XXA Infection and inflammatory reaction due to other cardiac and vascular devices, implants and grafts, initial encounter: Secondary | ICD-10-CM

## 2023-09-16 DIAGNOSIS — T8029XA Infection following other infusion, transfusion and therapeutic injection, initial encounter: Secondary | ICD-10-CM | POA: Diagnosis not present

## 2023-09-16 DIAGNOSIS — Z79899 Other long term (current) drug therapy: Secondary | ICD-10-CM | POA: Diagnosis not present

## 2023-09-16 DIAGNOSIS — A419 Sepsis, unspecified organism: Secondary | ICD-10-CM | POA: Insufficient documentation

## 2023-09-16 DIAGNOSIS — M7989 Other specified soft tissue disorders: Secondary | ICD-10-CM | POA: Diagnosis present

## 2023-09-16 LAB — CBC WITH DIFFERENTIAL/PLATELET
Abs Immature Granulocytes: 0.02 K/uL (ref 0.00–0.07)
Basophils Absolute: 0 K/uL (ref 0.0–0.1)
Basophils Relative: 0 %
Eosinophils Absolute: 0.1 K/uL (ref 0.0–0.5)
Eosinophils Relative: 1 %
HCT: 36.3 % (ref 36.0–46.0)
Hemoglobin: 11.9 g/dL — ABNORMAL LOW (ref 12.0–15.0)
Immature Granulocytes: 0 %
Lymphocytes Relative: 5 %
Lymphs Abs: 0.6 K/uL — ABNORMAL LOW (ref 0.7–4.0)
MCH: 30.5 pg (ref 26.0–34.0)
MCHC: 32.8 g/dL (ref 30.0–36.0)
MCV: 93.1 fL (ref 80.0–100.0)
Monocytes Absolute: 0.5 K/uL (ref 0.1–1.0)
Monocytes Relative: 4 %
Neutro Abs: 9.8 K/uL — ABNORMAL HIGH (ref 1.7–7.7)
Neutrophils Relative %: 90 %
Platelets: 267 K/uL (ref 150–400)
RBC: 3.9 MIL/uL (ref 3.87–5.11)
RDW: 13.1 % (ref 11.5–15.5)
WBC: 10.9 K/uL — ABNORMAL HIGH (ref 4.0–10.5)
nRBC: 0 % (ref 0.0–0.2)

## 2023-09-16 LAB — BASIC METABOLIC PANEL WITH GFR
Anion gap: 11 (ref 5–15)
BUN: 13 mg/dL (ref 8–23)
CO2: 28 mmol/L (ref 22–32)
Calcium: 9.1 mg/dL (ref 8.9–10.3)
Chloride: 93 mmol/L — ABNORMAL LOW (ref 98–111)
Creatinine, Ser: 0.82 mg/dL (ref 0.44–1.00)
GFR, Estimated: >60 mL/min (ref >60)
Glucose, Bld: 109 mg/dL — ABNORMAL HIGH (ref 70–99)
Potassium: 3.6 mmol/L (ref 3.5–5.1)
Sodium: 132 mmol/L — ABNORMAL LOW (ref 135–145)

## 2023-09-16 LAB — I-STAT CHEM 8, ED
BUN: 15 mg/dL (ref 8–23)
Calcium, Ion: 1.09 mmol/L — ABNORMAL LOW (ref 1.15–1.40)
Chloride: 92 mmol/L — ABNORMAL LOW (ref 98–111)
Creatinine, Ser: 0.8 mg/dL (ref 0.44–1.00)
Glucose, Bld: 105 mg/dL — ABNORMAL HIGH (ref 70–99)
HCT: 34 % — ABNORMAL LOW (ref 36.0–46.0)
Hemoglobin: 11.6 g/dL — ABNORMAL LOW (ref 12.0–15.0)
Potassium: 3.8 mmol/L (ref 3.5–5.1)
Sodium: 130 mmol/L — ABNORMAL LOW (ref 135–145)
TCO2: 29 mmol/L (ref 22–32)

## 2023-09-16 MED ORDER — CEPHALEXIN 500 MG PO CAPS
500.0000 mg | ORAL_CAPSULE | Freq: Four times a day (QID) | ORAL | 0 refills | Status: AC
  Filled 2023-09-16: qty 28, 7d supply, fill #0

## 2023-09-16 NOTE — ED Triage Notes (Signed)
 Pt here from home with c/o redness to her left arm from iv , pt was just discharged from hospital yesterday

## 2023-09-16 NOTE — ED Provider Notes (Signed)
 [place] EMERGENCY DEPARTMENT AT [facility] Provider Note   CSN: [id number] Arrival date & time: 09/16/23  1016     Patient presents with: Arm Problem   Kelly Snyder is a 78 y.o. female.   HPI Patient reports she got discharged from the hospital yesterday.  She started developing a red and very tender swelling around a prior IV site on the left medial arm.  She reports this morning it was painful, somewhat stiff and red and warm.  Patient has not had any fever or generalized constitutional symptoms.    Prior to Admission medications   Medication Sig Start Date End Date Taking? Authorizing Provider  cephALEXin  (KEFLEX ) 500 MG capsule Take 1 capsule (500 mg total) by mouth 4 (four) times daily. 09/16/23  Yes Armenta Canning, MD  acetaminophen  (TYLENOL ) 500 MG tablet Take 1,000 mg by mouth every 6 (six) hours as needed for moderate pain (pain).     [provider]  albuterol  (VENTOLIN  HFA) 108 (90 Base) MCG/ACT inhaler Inhale 2 puffs into the lungs every 6 (six) hours as needed for wheezing or shortness of breath.    [provider]  aspirin  81 MG chewable tablet Chew 1 tablet (81 mg total) by mouth daily. 01/16/23   Tharon Lung, MD  Calcium  Carb-Cholecalciferol (CALCIUM  600-D PO) Take 1 tablet by mouth daily.    [provider]  ezetimibe  (ZETIA ) 10 MG tablet Take 1 tablet (10 mg total) by mouth daily. 09/15/23   Tharon Lung, MD  furosemide  (LASIX ) 20 MG tablet Take 1 tablet (20 mg total) by mouth daily. 09/15/23   Tharon Lung, MD  Lidocaine  (ASPERCREME MAX STRENGTH) 4 % AERO Apply 1 Application topically daily as needed (hip arthritis pain).    [provider]  losartan  (COZAAR ) 25 MG tablet Take 0.5 tablets (12.5 mg total) by mouth daily. 09/15/23   Tharon Lung, MD  metoprolol  succinate (TOPROL -XL) 25 MG 24 hr tablet Take 0.5 tablets (12.5 mg total) by mouth daily. 09/15/23   Tharon Lung, MD  Multiple Vitamin (MULTIVITAMIN WITH  MINERALS) TABS tablet Take 1 tablet by mouth daily.    [provider]  nitroGLYCERIN  (NITROSTAT ) 0.4 MG SL tablet Place 1 tablet (0.4 mg total) under the tongue every 5 (five) minutes as needed for chest pain. 09/15/23   Tharon Lung, MD  pravastatin  (PRAVACHOL ) 20 MG tablet Take 1 tablet (20 mg total) by mouth daily at 6 PM. 09/15/23   Tharon Lung, MD  Tiotropium Bromide  Monohydrate (SPIRIVA  RESPIMAT) 2.5 MCG/ACT AERS INHALE 2 PUFFS BY MOUTH INTO THE LUNGS DAILY 06/10/23   Joshua Domino, DO    Allergies: Prednisone , Tizanidine , Lipitor [atorvastatin ], and Crestor  [rosuvastatin ]    Review of Systems  Updated Vital Signs BP (!) 109/48 (BP Location: Right Arm)   Pulse 93   Temp 98.5 F (36.9 C)   Resp 16   SpO2 95%   Physical Exam Constitutional:      Comments: Alert nontoxic clear mental status  HENT:     Mouth/Throat:     Pharynx: Oropharynx is clear.  Eyes:     Extraocular Movements: Extraocular movements intact.  Cardiovascular:     Rate and Rhythm: Normal rate and regular rhythm.  Pulmonary:     Effort: Pulmonary effort is normal.     Breath sounds: Normal breath sounds.  Musculoskeletal:     Comments: See attached images.  Patient has diffuse tender erythema overlying prior IV site at [facility] fossa on the left.  Hand is otherwise warm and dry with normal function.  Skin:    General: Skin is warm and dry.  Neurological:     General: No focal deficit present.     Mental Status: She is oriented to person, place, and time.     Motor: No weakness.     Coordination: Coordination normal.      (all labs ordered are listed, but only abnormal results are displayed) Labs Reviewed  CBC WITH DIFFERENTIAL/PLATELET - Abnormal; Notable for the following components:      Result Value   WBC 10.9 (*)    Hemoglobin 11.9 (*)    Neutro Abs 9.8 (*)    Lymphs Abs 0.6 (*)    All other components within normal limits  BASIC METABOLIC PANEL WITH GFR - Abnormal; Notable for the  following components:   Sodium 132 (*)    Chloride 93 (*)    Glucose, Bld 109 (*)    All other components within normal limits  I-STAT CHEM 8, ED - Abnormal; Notable for the following components:   Sodium 130 (*)    Chloride 92 (*)    Glucose, Bld 105 (*)    Calcium , Ion 1.09 (*)    Hemoglobin 11.6 (*)    HCT 34.0 (*)    All other components within normal limits    EKG: None  Radiology: No results found.   Procedures   Medications Ordered in the ED - No data to display                                  Medical Decision Making Amount and/or Complexity of Data Reviewed Labs: ordered.   Patient presents as outlined.  She has erythema and swelling with tenderness at IV site.  Findings consistent with probable cellulitis.  Patient does not recall ever being made aware in the event of extravasation and I do not find this noted on the chart.  By appearance, possible extravasation but no c necrotic appearance or ecchymosis.  Diffuse tender swelling without any streaking to suggest specifically thrombophlebitis.  At this time we will plan to start Keflex  and patient is counseled on elevating and cool compresses.  Return precautions reviewed.     Final diagnoses:  Cellulitis of left upper extremity  IV site infection, initial encounter [facility])    ED Discharge Orders          Ordered    cephALEXin  (KEFLEX ) 500 MG capsule  4 times daily        09/16/23 1552               Armenta Canning, MD 09/16/23 1600

## 2023-09-16 NOTE — Telephone Encounter (Signed)
 Pt is currently admitted in the hospital, will f/u with pt at a later date to see if she is interested in the cardiac rehab program.

## 2023-09-16 NOTE — ED Triage Notes (Signed)
 Pt. Stated, I was discharged yesterday from [address] [place] and took my IV out of my left arm. And this morning My left arm is Red, hot, and my rt. Arm is hard to move.

## 2023-09-16 NOTE — Discharge Instructions (Signed)
 1.  Start taking Keflex  today as prescribed. 2.  You may take extra strength Tylenol  for pain as needed. 3.  Elevate your arm is much as possible to help with swelling.  You may apply a cool pack to help with swelling and pain. 4.  Return to the emergency department if you develop fevers, generally feeling sick, rapidly worsening redness or other concerning changes.

## 2023-09-19 ENCOUNTER — Ambulatory Visit (INDEPENDENT_AMBULATORY_CARE_PROVIDER_SITE_OTHER): Payer: Self-pay

## 2023-09-19 VITALS — BP 121/53 | HR 75 | Ht 62.0 in | Wt 143.4 lb

## 2023-09-19 DIAGNOSIS — I5181 Takotsubo syndrome: Secondary | ICD-10-CM | POA: Diagnosis not present

## 2023-09-19 DIAGNOSIS — L03114 Cellulitis of left upper limb: Secondary | ICD-10-CM | POA: Diagnosis not present

## 2023-09-19 DIAGNOSIS — I10 Essential (primary) hypertension: Secondary | ICD-10-CM | POA: Diagnosis not present

## 2023-09-19 DIAGNOSIS — I959 Hypotension, unspecified: Secondary | ICD-10-CM | POA: Diagnosis not present

## 2023-09-19 NOTE — Patient Instructions (Signed)
 Thank you for visiting the clinic today, it was good to see you!  Today we talked about: - Please take Furosemide  (Lasix ) as needed. Your prescription states daily but this medicine should be taken as needed. If you experience swelling in your legs, difficulty breathing or increased weight gain, then take this medicine. Please confirm this with your heart doctor when you see them on Wednesday - Make sure to bring your blood pressure log to your heart doctor visit on Wednesday.  - Please return for labs on Wednesday as our lab is closed today - Please continue taking all other medications  For any questions, please call the office at [phone] or send me a message in MyChart.  It was a pleasure to take care of you today. Have a great day!  Nana Vastine, DO [facility] Resident, PGY-1  ---------------------------------------------------------------------------------------------------------------------  Do you need your medications delivered to your home?   We'll send your prescription to the [facility] for delivery.          Address: [address] [place], [place], [place] [address]          Phone: [phone]  Please call the [facility] to speak with a pharmacist and set up your home medication delivery. If you have any questions, feel free to contact us  -- we're happy to help!  Other [facility] that offer affordable prices on both prescriptions and over-the-counter items, as well as convenient services like vaccinations, are  [facility], at [facility]         Address:  [address], [place], [place] [address]         Phone: [phone]  [facility], located in the Heart & Vascular Center        Address: [address], [place], [place] [address]        Phone: [phone]  [facility], at [facility]       Address: [address],  [place], [place] [address]       Phone: [phone]  [facility], at [facility]       Address: [address] Floor, [place], [place] [address]       Phone: [phone]

## 2023-09-20 ENCOUNTER — Telehealth (HOSPITAL_COMMUNITY): Payer: Self-pay

## 2023-09-20 NOTE — Telephone Encounter (Signed)
 Called to confirm/remind patient of their appointment at the [facility] on 09/21/23 9:00.   Appointment:   [x] Confirmed  [] Left mess   [] No answer/No voice mail  [] VM Full/unable to leave message  [] Phone not in service  Patient reminded to bring all medications and/or complete list.  Confirmed patient has transportation. Gave directions, instructed to utilize valet parking.

## 2023-09-20 NOTE — Progress Notes (Signed)
 HEART & VASCULAR TRANSITION OF CARE CONSULT NOTE   Referring Physician: Dr. Delores PCP: Lonnie Earnest, MD  Cardiologist: Newman JINNY Lawrence, MD  HPI: Referred to clinic by Dr. Delores for heart failure consultation.   Kelly Snyder is a 78 y.o. female with history of subclavian stenosis, COPD, and HTN.   Recently admitted to [facility] for NSTEMI, hstrops up to 1099. Echo performed (see below) and Cardiology was consulted. She underwent RHC. She was started on GDMT with toprol , losartan  and PRN Lasix .  She then presented to the ED the following day for redness and tenderness at an old IV site, felt to be cellulitis. Was prescribed cephalexin .    Today she presents for transition of care visit. Overall feeling ***. NYHA ***. Reports {Symptoms; cardiac:12860::dyspnea,fatigue}. Denies {Symptoms; cardiac:12860::chest pain,dyspnea,fatigue,near-syncope,orthopnea,palpitations,dizziness,abnormal bleeding}. Able to perform ADLs. Appetite okay. Weight at home ***. BP at home***. Compliant with all medications. Denies ETOH, tobacco, or drug use.     Cardiac Testing:  [facility] 9/25: nor cors. LV findings consistent with Takosubos CM with WHO group 2 PAH.  TTE 9/25: EF 35-40%, RWMA, G2DD, RV nl, RVSP 35 TTE 1/25: 60-65%, nl RV, nl RVSP  Past Medical History:  Diagnosis Date   Arthritis    COPD (chronic obstructive pulmonary disease) (HCC)    Pneumonia     Current Outpatient Medications  Medication Sig Dispense Refill   acetaminophen  (TYLENOL ) 500 MG tablet Take 1,000 mg by mouth every 6 (six) hours as needed for moderate pain (pain).      albuterol  (VENTOLIN  HFA) 108 (90 Base) MCG/ACT inhaler Inhale 2 puffs into the lungs every 6 (six) hours as needed for wheezing or shortness of breath.     aspirin  81 MG chewable tablet Chew 1 tablet (81 mg total) by mouth daily.     Calcium  Carb-Cholecalciferol (CALCIUM  600-D PO) Take 1 tablet by mouth daily.     cephALEXin  (KEFLEX ) 500  MG capsule Take 1 capsule (500 mg total) by mouth 4 (four) times daily. 28 capsule 0   ezetimibe  (ZETIA ) 10 MG tablet Take 1 tablet (10 mg total) by mouth daily. 30 tablet 0   furosemide  (LASIX ) 20 MG tablet Take 1 tablet (20 mg total) by mouth daily. 30 tablet 0   Lidocaine  (ASPERCREME MAX STRENGTH) 4 % AERO Apply 1 Application topically daily as needed (hip arthritis pain).     losartan  (COZAAR ) 25 MG tablet Take 0.5 tablets (12.5 mg total) by mouth daily. 30 tablet 0   metoprolol  succinate (TOPROL -XL) 25 MG 24 hr tablet Take 0.5 tablets (12.5 mg total) by mouth daily. 30 tablet 0   Multiple Vitamin (MULTIVITAMIN WITH MINERALS) TABS tablet Take 1 tablet by mouth daily.     nitroGLYCERIN  (NITROSTAT ) 0.4 MG SL tablet Place 1 tablet (0.4 mg total) under the tongue every 5 (five) minutes as needed for chest pain. 30 tablet 0   pravastatin  (PRAVACHOL ) 20 MG tablet Take 1 tablet (20 mg total) by mouth daily at 6 PM. 30 tablet 0   Tiotropium Bromide  Monohydrate (SPIRIVA  RESPIMAT) 2.5 MCG/ACT AERS INHALE 2 PUFFS BY MOUTH INTO THE LUNGS DAILY 1 each 3   No current facility-administered medications for this visit.    Allergies  Allergen Reactions   Prednisone  Itching and Rash    Patient broke out in a full body rash   Tizanidine  Itching and Rash    Patient broke out in a full body rash   Lipitor [Atorvastatin ] Other (See Comments)  Muscle/joint pain   Crestor  [Rosuvastatin ] Rash      Social History   Socioeconomic History   Marital status: Divorced    Spouse name: Not on file   Number of children: 1   Years of education: 12   Highest education level: 12th grade  Occupational History   Occupation: retired  Tobacco Use   Smoking status: Former    Current packs/day: 1.00    Average packs/day: 1 pack/day for 57.7 years (57.7 ttl pk-yrs)    Types: Cigarettes    Start date: 01/04/1966    Passive exposure: Past   Smokeless tobacco: Never   Tobacco comments:    quit 10/28/2018  Vaping  Use   Vaping status: Never Used  Substance and Sexual Activity   Alcohol use: Not Currently   Drug use: Never   Sexual activity: Not Currently  Other Topics Concern   Not on file  Social History Narrative   Health Care POA: Emergency Contact: Garnette (son) [phone]       Patient lives with her son and grandson. Patient has dogs and cats.    Patient enjoys gardening and walking for exercise.    Patients sister has Parkinson's Disease. Patient enjoys spending time with her.          Social Drivers of Corporate investment banker Strain: Low Risk  (04/14/2023)   Overall Financial Resource Strain (CARDIA)    Difficulty of Paying Living Expenses: Not hard at all  Food Insecurity: No Food Insecurity (09/13/2023)   Hunger Vital Sign    Worried About Running Out of Food in the Last Year: Never true    Ran Out of Food in the Last Year: Never true  Transportation Needs: No Transportation Needs (09/13/2023)   PRAPARE - Administrator, Civil Service (Medical): No    Lack of Transportation (Non-Medical): No  Physical Activity: Sufficiently Active (04/14/2023)   Exercise Vital Sign    Days of Exercise per Week: 7 days    Minutes of Exercise per Session: 30 min  Stress: No Stress Concern Present (04/14/2023)   [employer] of Occupational Health - Occupational Stress Questionnaire    Feeling of Stress : Not at all  Social Connections: Moderately Isolated (09/13/2023)   Social Connection and Isolation Panel    Frequency of Communication with Friends and Family: Twice a week    Frequency of Social Gatherings with Friends and Family: Once a week    Attends Religious Services: 1 to 4 times per year    Active Member of [employer] or Organizations: No    Attends Banker Meetings: Never    Marital Status: Divorced  Catering manager Violence: Not At Risk (09/13/2023)   Humiliation, Afraid, Rape, and Kick questionnaire    Fear of Current or Ex-Partner: No    Emotionally Abused: No     Physically Abused: No    Sexually Abused: No    Family History  Problem Relation Age of Onset   Diabetes Mother    Heart disease Mother    Heart disease Father    Parkinson's disease Sister    Dementia Sister    Alcohol abuse Son     There were no vitals filed for this visit.  There were no vitals filed for this visit.   PHYSICAL EXAM: General: Well appearing. No distress on RA Cardiac: JVP ***. S1 and S2 present. No murmurs or rub. Resp: Lung sounds clear and equal B/L Abdomen: Soft, non-tender, non-distended.  Extremities: Warm and dry.  *** edema.  Neuro: Alert and oriented x3. Affect pleasant. Moves all extremities without difficulty.  ECG (personally reviewed):  ASSESSMENT & PLAN:  Chronic HFrEF - new diagnosis. EF now 35-40% with RWMA and G2DD. Prior echo with normal function 1/25. - R/LHC: nor cors, periarterial calicifcations. RA 9, PA 44/23 (32), PW 20, CO/CI (fick) 4.58/2.43, PAPi 2.3. Mild PH, LV findings consistent with Takotubo's CM.  - NYHA *** Volume *** - Continue Lasix  20 mg PRN - GDMT:  ARB/ARNi: continue losartan  25 mg daily ? blocker: continue toprolXL 25 mg daily MRA: *** SGLT2i: - Will need repeat echo in 3 months  CAD  HLD - cath 9/25 with periarterial calcifications - continue pravastatin  20 mg daily - continue aspirin   COPD - emphysema - on spiriva    HTN - BP controlled ***  L Subclavian Stenosis - take BP on R arm - seen by [facility]VS felt not to be clinically signifcant   Referred to HFSW (PCP, Medications, Transportation, ETOH Abuse, Drug Abuse, Insurance, Financial ): Yes or No Refer to Pharmacy: Yes or No Refer to Home Health: Yes on No Refer to [facility]: Yes or no  Refer to General Cardiology: Yes or No  Follow up with ***  [place] Reesa Gotschall, NP 09/20/23

## 2023-09-21 ENCOUNTER — Ambulatory Visit (HOSPITAL_COMMUNITY)
Admit: 2023-09-21 | Discharge: 2023-09-21 | Disposition: A | Discharge status: Home / Self Care | Source: Ambulatory Visit | Attending: Cardiology | Admitting: Cardiology

## 2023-09-21 ENCOUNTER — Other Ambulatory Visit

## 2023-09-21 ENCOUNTER — Telehealth (HOSPITAL_COMMUNITY): Payer: Self-pay

## 2023-09-21 ENCOUNTER — Other Ambulatory Visit (HOSPITAL_COMMUNITY): Payer: Self-pay

## 2023-09-21 ENCOUNTER — Encounter (HOSPITAL_COMMUNITY): Payer: Self-pay

## 2023-09-21 VITALS — BP 102/60 | HR 79 | Ht 62.0 in | Wt 141.2 lb

## 2023-09-21 DIAGNOSIS — J439 Emphysema, unspecified: Secondary | ICD-10-CM | POA: Diagnosis not present

## 2023-09-21 DIAGNOSIS — I10 Essential (primary) hypertension: Secondary | ICD-10-CM

## 2023-09-21 DIAGNOSIS — Z7982 Long term (current) use of aspirin: Secondary | ICD-10-CM | POA: Diagnosis not present

## 2023-09-21 DIAGNOSIS — I708 Atherosclerosis of other arteries: Secondary | ICD-10-CM | POA: Diagnosis not present

## 2023-09-21 DIAGNOSIS — J449 Chronic obstructive pulmonary disease, unspecified: Secondary | ICD-10-CM | POA: Diagnosis not present

## 2023-09-21 DIAGNOSIS — Z79899 Other long term (current) drug therapy: Secondary | ICD-10-CM | POA: Insufficient documentation

## 2023-09-21 DIAGNOSIS — L0390 Cellulitis, unspecified: Secondary | ICD-10-CM | POA: Insufficient documentation

## 2023-09-21 DIAGNOSIS — I2510 Atherosclerotic heart disease of native coronary artery without angina pectoris: Secondary | ICD-10-CM | POA: Diagnosis not present

## 2023-09-21 DIAGNOSIS — Z87891 Personal history of nicotine dependence: Secondary | ICD-10-CM | POA: Diagnosis not present

## 2023-09-21 DIAGNOSIS — I110 Hypertensive heart disease with heart failure: Secondary | ICD-10-CM | POA: Insufficient documentation

## 2023-09-21 DIAGNOSIS — E785 Hyperlipidemia, unspecified: Secondary | ICD-10-CM | POA: Diagnosis not present

## 2023-09-21 DIAGNOSIS — I5022 Chronic systolic (congestive) heart failure: Secondary | ICD-10-CM | POA: Diagnosis not present

## 2023-09-21 MED ORDER — DAPAGLIFLOZIN PROPANEDIOL 10 MG PO TABS: 10.0000 mg | ORAL_TABLET | Freq: Every day | ORAL | 11 refills | Status: DC

## 2023-09-21 MED ORDER — SPIRONOLACTONE 25 MG PO TABS: 12.5000 mg | ORAL_TABLET | Freq: Every day | ORAL | 3 refills | Status: DC

## 2023-09-21 NOTE — Patient Instructions (Addendum)
 Stop Lasix . Start Spiro 12.5 mg daily - Rx sent. Start Farxiga  10 mg daily - Rx sent. Labs in 10 days - see below. Please take your blood pressure on your right arm.  We have referred you back to General Cardiology to see Dr. Katharyn. You may call them at number below if needed.  6.   You may call us  at [phone] if you have any questions for us .

## 2023-09-21 NOTE — Progress Notes (Unsigned)
 SUBJECTIVE:   CHIEF COMPLAINT / HPI:   Kelly Snyder presents today for hospital follow up.   Hospitalized at [facility] from 09/13/23 to 09/15/23, for NSTEMI.  Since discharge, patient reports doing well. She is checking her blood pressure at home and states it is well controlled. She does report one reading of 88/42 soon after discharge but denies any dizziness. She is also checking her weight daily and reports it remains constant at 144lbs. She is taking all medications as prescribed. Of note, Lasix  20mg  was prescribed as a daily medication but from chart review, recommendation is to take it PRN.   She does report erythema and edema of her L arm at previous IV site for which she went to the ED. Found to be cellulitis for which she was rx'd Keflex . States it is improving with antibiotics and is on longer bothering her as much. Denies any other trauma to the area.  PERTINENT  PMH / PSH: COPD, HTN, HLD with statin intolerance, subclavian artery stenosis, GERD  OBJECTIVE:   BP (!) 121/53   Pulse 75   Ht 5' 2 (1.575 m)   Wt 143 lb 6.4 oz (65 kg)   SpO2 99%   BMI 26.23 kg/m    General: Alert, well-appearing female in NAD.  HEENT: Normocephalic, atraumatic. Neck: Supple, normal ROM, no LAD Cardiovascular: RRR, S1/S2 normal. No murmurs, rubs, or gallops appreciated. radial pulse +2 bilaterally Pulmonary: Normal work of breathing. CTAB with no wheezes or crackles present  Abdomen: Soft, non-tender, non-distended. Extremities: Warm and well-perfused, without cyanosis or edema. Moves all extremities equally. Small area of erythema surrounding prior IV site on L arm, no bleeding or induration. See image in media tab. Neurologic: No focal deficits. Skin: No rashes or lesions. Psych: Appropriate mood and affect  ASSESSMENT/PLAN:   Assessment & Plan Stress-induced cardiomyopathy Patient remains stable since discharge. Of note, Lasix  20mg  was prescribed as a daily medication but  from chart review, recommendation is to take it PRN. Patient has been taking it daily and has remained asymptomatic. Daily weights remain consistent at 144lbs. We discussed that Lasix  is supposed to be a PRN medication. She has already taken it today but I recommended taking it PRN starting tomorrow. Patient has an appointment with cardiology on 9/17 and I encouraged her to discuss this further then. Will defer medication adjustment to cardiology as well as f/u echo which was recommended at time of discharge. I would also like to get a BMP but our lab is closed today. Discussed options with patient and she would like to return on Wednesday for labs.  - Order placed for BMP; patient will return Wednesday for labs - Stop taking Furosemide  20 mg daily - Take Furosemide  mg PRN - Continue all other medications as prescribed for now Cellulitis of left upper extremity Benign exam with improvement when compared to previous image from ED note. Patient also reports significant improvement with Keflex . - Continue Keflex  500mg  4x/day as prescribed Hypotension, unspecified hypotension type BP in the office 121/53. One reading of 88/42 since discharge without any symptoms. Patient had sBP 80-90 while admitted and remained asymptomatic. Cardiology was consulted at the time. Patient has BP log at home but did not bring to visit today. She does have a cardiology appointment 9/17. Will defer any medication changes to cardiology team - Bring blood pressure log to cardiology appointment - Continue all medications for now    Darren Jernigan, DO [facility]

## 2023-09-21 NOTE — Assessment & Plan Note (Deleted)
-   Order placed for BMP - Stop taking Furosemide  20 mg daily - Take Furosemide  mg PRN  Today we talked about: - Please take Furosemide  (Lasix ) as needed. Your prescription states daily but this medicine should be taken as needed. If you experience swelling in your legs, difficulty breathing or increased weight gain, then take this medicine. Please confirm this with your heart doctor when you see them on Wednesday - Make sure to bring your blood pressure log to your heart doctor visit on Wednesday.  - Please return for labs on Wednesday as our lab is closed today - Please continue taking all other medications  Patient has an appointment with cardiology on 9/17. Will defer medication adjustment to them as well as f/u echo.

## 2023-09-21 NOTE — Telephone Encounter (Signed)
 Advanced Heart Failure Patient Advocate Encounter  Test billing for this patient's current coverage ([employer] M[employer]) returns a $0 copay for 90 day supply of Farxiga  or Jardiance.  This test claim was processed through [facility]- copay amounts may vary at other pharmacies due to pharmacy/plan contracts, or as the patient moves through the different stages of their insurance plan.  Rachel DEL, CPhT Rx Patient Advocate Phone: [phone]

## 2023-09-21 NOTE — Assessment & Plan Note (Signed)
>>  ASSESSMENT AND PLAN FOR HYPERTENSION WRITTEN ON 09/21/2023 10:58 AM BY Gabrielle Mester, DO  - Order placed for BMP - Stop taking Furosemide  20 mg daily - Take Furosemide  mg PRN  Today we talked about: - Please take Furosemide  (Lasix ) as needed. Your prescription states daily but this medicine should be taken as needed. If you experience swelling in your legs, difficulty breathing or increased weight gain, then take this medicine. Please confirm this with your heart doctor when you see them on Wednesday - Make sure to bring your blood pressure log to your heart doctor visit on Wednesday.  - Please return for labs on Wednesday as our lab is closed today - Please continue taking all other medications

## 2023-09-21 NOTE — Assessment & Plan Note (Signed)
 Benign exam with improvement when compared to previous image from ED note. Patient also reports significant improvement with Keflex . - Continue Keflex  500mg  4x/day as prescribed

## 2023-09-22 DIAGNOSIS — I959 Hypotension, unspecified: Secondary | ICD-10-CM | POA: Insufficient documentation

## 2023-09-22 LAB — BASIC METABOLIC PANEL WITH GFR
BUN/Creatinine Ratio: 16 (ref 12–28)
BUN: 13 mg/dL (ref 8–27)
CO2: 28 mmol/L (ref 20–29)
Calcium: 9.5 mg/dL (ref 8.7–10.3)
Chloride: 91 mmol/L — ABNORMAL LOW (ref 96–106)
Creatinine, Ser: 0.79 mg/dL (ref 0.57–1.00)
Glucose: 91 mg/dL (ref 70–99)
Potassium: 4.8 mmol/L (ref 3.5–5.2)
Sodium: 135 mmol/L (ref 134–144)
eGFR: 77 mL/min/1.73 (ref >59)

## 2023-09-22 NOTE — Assessment & Plan Note (Signed)
 BP in the office 121/53. One reading of 88/42 since discharge without any symptoms. Patient had sBP 80-90 while admitted and remained asymptomatic. Cardiology was consulted at the time. Patient has BP log at home but did not bring to visit today. She does have a cardiology appointment 9/17. Will defer any medication changes to cardiology team - Bring blood pressure log to cardiology appointment - Continue all medications for now

## 2023-09-22 NOTE — Assessment & Plan Note (Signed)
 Patient remains stable since discharge. Of note, Lasix  20mg  was prescribed as a daily medication but from chart review, recommendation is to take it PRN. Patient has been taking it daily and has remained asymptomatic. Daily weights remain consistent at 144lbs. We discussed that Lasix  is supposed to be a PRN medication. She has already taken it today but I recommended taking it PRN starting tomorrow. Patient has an appointment with cardiology on 9/17 and I encouraged her to discuss this further then. Will defer medication adjustment to cardiology as well as f/u echo which was recommended at time of discharge. I would also like to get a BMP but our lab is closed today. Discussed options with patient and she would like to return on Wednesday for labs.  - Order placed for BMP; patient will return Wednesday for labs - Stop taking Furosemide  20 mg daily - Take Furosemide  mg PRN - Continue all other medications as prescribed for now

## 2023-09-23 ENCOUNTER — Ambulatory Visit: Payer: Self-pay

## 2023-09-26 ENCOUNTER — Telehealth (HOSPITAL_COMMUNITY): Payer: Self-pay

## 2023-09-26 NOTE — Telephone Encounter (Signed)
 Pt insurance is active and benefits verified through [facility]. Co-pay $0, DED $257/$257 met, out of pocket $9,350/$610.65 met, co-insurance 20%. No pre-authorization required. 09/26/2023 @ 2:58pm, spoke with Vin O., REF# [id number].  TCR/ICR? ICR Visit(date of service)limitation? No Can multiple codes be used on the same date of service/visit?(IF ITS A LIMIT) N/A  Is this a lifetime maximum or an annual maximum? Annual Has the member used any of these services to date? No Is there a time limit (weeks/months) on start of program and/or program completion? No  *Insurance is listed as Dual Complete but no valid Medicaid coverage is listed, [employer] does not verify Medicaid coverage.

## 2023-09-27 ENCOUNTER — Other Ambulatory Visit: Payer: Self-pay

## 2023-09-27 ENCOUNTER — Telehealth (HOSPITAL_COMMUNITY): Payer: Self-pay

## 2023-09-27 NOTE — Telephone Encounter (Signed)
 Patient states she has [employer] Dual Complete coverage, informed patient there is no Medicaid coverage listed in her chart. Patient gave her [facility] Medicaid ID# [id number], attempted to query coverage but it was auto-rejected. Informed patient she will need to bring in her Medicaid information to her orientation so we can confirm it.

## 2023-09-27 NOTE — Telephone Encounter (Signed)
 Attempted to call patient regarding cardiac rehab- no answer, left message. Sent MyChart message.

## 2023-09-27 NOTE — Telephone Encounter (Signed)
 Patient called back to get scheduled in the Cardiac Rehab Program. Patient will come in for orientation on 9/25 and will attend the 10:15 exercise class.  Sent MyChart message.

## 2023-09-29 ENCOUNTER — Encounter (HOSPITAL_COMMUNITY)
Admission: RE | Admit: 2023-09-29 | Discharge: 2023-09-29 | Disposition: A | Discharge status: Home / Self Care | Source: Ambulatory Visit | Attending: Cardiology | Admitting: Cardiology

## 2023-09-29 VITALS — BP 112/58 | HR 74 | Ht 64.25 in | Wt 141.1 lb

## 2023-09-29 DIAGNOSIS — I214 Non-ST elevation (NSTEMI) myocardial infarction: Secondary | ICD-10-CM | POA: Insufficient documentation

## 2023-09-29 NOTE — Progress Notes (Signed)
 Patient here for cardiac rehab orientation. Telemetry rhythm Sinus with t wave inversion. Last 12 lead in the hospital did not show t wave inversion. Patient asymptomatic. Blood pressure 112/58 heart rate 73. Will show today's ECG tracings to onsite provider Lum Louis NP. Onsite provider Lum Louis NP notified and reviewed strips. Per [place] Its b/c she has Takotsubo cardiomyopathy which can cause T wave inversions which can be persistent for days or even weeks  Thu 10:23 AM  Okay to proceed with exercise and 6 minute walk test.Will continue to monitor the patient throughout  the program. Hadassah Elpidio Quan RN BSN

## 2023-09-29 NOTE — Progress Notes (Signed)
 Cardiac Rehab Medication Review   Does the patient  feel that his/her medications are working for him/her?  yes  Has the patient been experiencing any side effects to the medications prescribed?  no  Does the patient measure his/her own blood pressure or blood glucose at home?  yes   Does the patient have any problems obtaining medications due to transportation or finances?   no  Understanding of regimen: good Understanding of indications: good Potential of compliance: good    Comments: She is taking all her meds as prescribed. She states she's never taken this many before, but she is compliant. She has a BP cuff at home, and she checks her BP daily.      Alec GORMAN Finder 09/29/2023 1:03 PM

## 2023-09-29 NOTE — Progress Notes (Signed)
 Cardiac Individual Treatment Plan  Patient Details  Name: AKEEMA BRODER MRN: [id number] Date of Birth: 1945/02/08 Referring Provider:   Flowsheet Row INTENSIVE CARDIAC REHAB ORIENT from 09/29/2023 in [facility]  Referring Provider Dr. Newman Lawrence, MD    Initial Encounter Date:  Flowsheet Row INTENSIVE CARDIAC REHAB ORIENT from 09/29/2023 in [facility]  Date 09/29/23    Visit Diagnosis: 09/12/23 NSTEMI (non-ST elevated myocardial infarction) [facility])  Patient's Home Medications on Admission:  Current Outpatient Medications:    acetaminophen  (TYLENOL ) 500 MG tablet, Take 1,000 mg by mouth every 6 (six) hours as needed for moderate pain (pain). , Disp: , Rfl:    albuterol  (VENTOLIN  HFA) 108 (90 Base) MCG/ACT inhaler, Inhale 2 puffs into the lungs every 6 (six) hours as needed for wheezing or shortness of breath., Disp: , Rfl:    aspirin  81 MG chewable tablet, Chew 1 tablet (81 mg total) by mouth daily., Disp: , Rfl:    Calcium  Carb-Cholecalciferol (CALCIUM  600-D PO), Take 1 tablet by mouth daily., Disp: , Rfl:    dapagliflozin  propanediol (FARXIGA ) 10 MG TABS tablet, Take 1 tablet (10 mg total) by mouth daily before breakfast., Disp: 30 tablet, Rfl: 11   ezetimibe  (ZETIA ) 10 MG tablet, Take 1 tablet (10 mg total) by mouth daily., Disp: 30 tablet, Rfl: 0   Lidocaine  (ASPERCREME MAX STRENGTH) 4 % AERO, Apply 1 Application topically daily as needed (hip arthritis pain)., Disp: , Rfl:    losartan  (COZAAR ) 25 MG tablet, Take 0.5 tablets (12.5 mg total) by mouth daily., Disp: 30 tablet, Rfl: 0   metoprolol  succinate (TOPROL -XL) 25 MG 24 hr tablet, Take 0.5 tablets (12.5 mg total) by mouth daily., Disp: 30 tablet, Rfl: 0   Multiple Vitamin (MULTIVITAMIN WITH MINERALS) TABS tablet, Take 1 tablet by mouth daily., Disp: , Rfl:    nitroGLYCERIN  (NITROSTAT ) 0.4 MG SL tablet, Place 1  tablet (0.4 mg total) under the tongue every 5 (five) minutes as needed for chest pain., Disp: 30 tablet, Rfl: 0   pravastatin  (PRAVACHOL ) 20 MG tablet, Take 1 tablet (20 mg total) by mouth daily at 6 PM., Disp: 30 tablet, Rfl: 0   spironolactone  (ALDACTONE ) 25 MG tablet, Take 0.5 tablets (12.5 mg total) by mouth daily., Disp: 45 tablet, Rfl: 3   Tiotropium Bromide  Monohydrate (SPIRIVA  RESPIMAT) 2.5 MCG/ACT AERS, INHALE 2 PUFFS BY MOUTH INTO THE LUNGS DAILY, Disp: 1 each, Rfl: 3   cephALEXin  (KEFLEX ) 500 MG capsule, Take 1 capsule (500 mg total) by mouth 4 (four) times daily. (Patient not taking: Reported on 09/29/2023), Disp: 28 capsule, Rfl: 0  Past Medical History: Past Medical History:  Diagnosis Date   Arthritis    COPD (chronic obstructive pulmonary disease) (HCC)    Pneumonia    Rash 02/17/2023    Tobacco Use: Social History   Tobacco Use  Smoking Status Former   Current packs/day: 1.00   Average packs/day: 1 pack/day for 57.7 years (57.7 ttl pk-yrs)   Types: Cigarettes   Start date: 01/04/1966   Passive exposure: Past  Smokeless Tobacco Never  Tobacco Comments   quit 10/28/2018    Labs: Review Flowsheet  More data exists      Latest Ref Rng & Units 02/04/2021 12/28/2021 01/15/2023 09/13/2023 09/16/2023  Labs for ITP Cardiac and Pulmonary Rehab  Cholestrol 0 - 200 mg/dL - - 801  793  -  LDL (calc) 0 -  99 mg/dL - - 882  884  -  Direct LDL 0 - 99 mg/dL 863  - - - -  HDL-C >59 mg/dL - - 74  84  -  Trlycerides <150 mg/dL - - 37  34  -  Hemoglobin A1c 4.8 - 5.6 % - - 5.2  - -  PH, Arterial 7.35 - 7.45 - - - 7.395  -  PCO2 arterial 32 - 48 mmHg - - - 43.5  -  Bicarbonate 20.0 - 28.0 mmol/L - - - 27.4  26.6  -  TCO2 22 - 32 mmol/L - 29  - 29  28  29    O2 Saturation % - - 98.2  54  87  -    Details       Multiple values from one day are sorted in reverse-chronological order         Capillary Blood Glucose: No results found for: GLUCAP   Exercise Target  Goals: Exercise Program Goal: Individual exercise prescription set using results from initial 6 min walk test and THRR while considering  patient's activity barriers and safety.   Exercise Prescription Goal: Initial exercise prescription builds to 30-45 minutes a day of aerobic activity, 2-3 days per week.  Home exercise guidelines will be given to patient during program as part of exercise prescription that the participant will acknowledge.  Activity Barriers & Risk Stratification:  Activity Barriers & Cardiac Risk Stratification - 09/29/23 1243       Activity Barriers & Cardiac Risk Stratification   Activity Barriers Joint Problems;Deconditioning;Arthritis;Balance Concerns    Cardiac Risk Stratification High          6 Minute Walk:  6 Minute Walk     Row Name 09/29/23 1232         6 Minute Walk   Phase Initial     Distance 720 feet     Walk Time 6 minutes     # of Rest Breaks 0     MPH 1.36     METS 1.52     RPE 11     Perceived Dyspnea  0     VO2 Peak 5.33     Symptoms No     Resting HR 74 bpm     Resting BP 112/58     Resting Oxygen Saturation  94 %     Exercise Oxygen Saturation  during 6 min walk 98 %     Max Ex. HR 90 bpm     Max Ex. BP 128/64     2 Minute Post BP 114/60        Oxygen Initial Assessment:   Oxygen Re-Evaluation:   Oxygen Discharge (Final Oxygen Re-Evaluation):   Initial Exercise Prescription:  Initial Exercise Prescription - 09/29/23 1200       Date of Initial Exercise RX and Referring Provider   Date 09/29/23    Referring Provider Dr. Newman Lawrence, MD    Expected Discharge Date 12/23/23      NuStep   Level 1    SPM 65    Minutes 25    METs 1.5      Prescription Details   Frequency (times per week) 3    Duration Progress to 30 minutes of continuous aerobic without signs/symptoms of physical distress      Intensity   THRR 40-80% of Max Heartrate 57-114    Ratings of Perceived Exertion 11-13    Perceived Dyspnea  0-4      Progression  Progression Continue progressive overload as per policy without signs/symptoms or physical distress.      Resistance Training   Training Prescription Yes    Weight 1    Reps 10-15          Perform Capillary Blood Glucose checks as needed.  Exercise Prescription Changes:   Exercise Comments:   Exercise Goals and Review:   Exercise Goals     Row Name 09/29/23 0923             Exercise Goals   Increase Physical Activity Yes       Intervention Provide advice, education, support and counseling about physical activity/exercise needs.;Develop an individualized exercise prescription for aerobic and resistive training based on initial evaluation findings, risk stratification, comorbidities and participant's personal goals.       Expected Outcomes Short Term: Attend rehab on a regular basis to increase amount of physical activity.;Long Term: Add in home exercise to make exercise part of routine and to increase amount of physical activity.;Long Term: Exercising regularly at least 3-5 days a week.       Increase Strength and Stamina Yes       Intervention Provide advice, education, support and counseling about physical activity/exercise needs.;Develop an individualized exercise prescription for aerobic and resistive training based on initial evaluation findings, risk stratification, comorbidities and participant's personal goals.       Expected Outcomes Short Term: Increase workloads from initial exercise prescription for resistance, speed, and METs.;Short Term: Perform resistance training exercises routinely during rehab and add in resistance training at home;Long Term: Improve cardiorespiratory fitness, muscular endurance and strength as measured by increased METs and functional capacity ( )       Able to understand and use rate of perceived exertion (RPE) scale Yes       Intervention Provide education and explanation on how to use RPE scale       Expected  Outcomes Short Term: Able to use RPE daily in rehab to express subjective intensity level;Long Term:  Able to use RPE to guide intensity level when exercising independently       Knowledge and understanding of Target Heart Rate Range (THRR) Yes       Intervention Provide education and explanation of THRR including how the numbers were predicted and where they are located for reference       Expected Outcomes Short Term: Able to state/look up THRR;Short Term: Able to use daily as guideline for intensity in rehab;Long Term: Able to use THRR to govern intensity when exercising independently       Understanding of Exercise Prescription Yes       Intervention Provide education, explanation, and written materials on patient's individual exercise prescription       Expected Outcomes Short Term: Able to explain program exercise prescription;Long Term: Able to explain home exercise prescription to exercise independently          Exercise Goals Re-Evaluation :   Discharge Exercise Prescription (Final Exercise Prescription Changes):   Nutrition:  Target Goals: Understanding of nutrition guidelines, daily intake of sodium 1500mg , cholesterol 200mg , calories 30% from fat and 7% or less from saturated fats, daily to have 5 or more servings of fruits and vegetables.  Biometrics:  Pre Biometrics - 09/29/23 0921       Pre Biometrics   Waist Circumference 32 inches    Hip Circumference 41.5 inches    Waist to Hip Ratio 0.77 %    Triceps Skinfold 15 mm    %  Body Fat 33.4 %    Grip Strength 10 kg    Flexibility 11.5 in    Single Leg Stand 3 seconds           Nutrition Therapy Plan and Nutrition Goals:   Nutrition Assessments:  MEDIFICTS Score Key: >=70 Need to make dietary changes  40-70 Heart Healthy Diet <= 40 Therapeutic Level Cholesterol Diet    Picture Your Plate Scores: <59 Unhealthy dietary pattern with much room for improvement. 41-50 Dietary pattern unlikely to meet  recommendations for good health and room for improvement. 51-60 More healthful dietary pattern, with some room for improvement.  >60 Healthy dietary pattern, although there may be some specific behaviors that could be improved.    Nutrition Goals Re-Evaluation:   Nutrition Goals Re-Evaluation:   Nutrition Goals Discharge (Final Nutrition Goals Re-Evaluation):   Psychosocial: Target Goals: Acknowledge presence or absence of significant depression and/or stress, maximize coping skills, provide positive support system. Participant is able to verbalize types and ability to use techniques and skills needed for reducing stress and depression.  Initial Review & Psychosocial Screening:  Initial Psych Review & Screening - 09/29/23 1247       Initial Review   Current issues with Current Stress Concerns    Source of Stress Concerns Family    Comments Some mild stress with the well being of her family. Overal she feels good and has a good support group with her church family, son and his girlfriend. No needs at this time      Family Dynamics   Good Support System? Yes      Barriers   Psychosocial barriers to participate in program The patient should benefit from training in stress management and relaxation.      Screening Interventions   Interventions Encouraged to exercise;To provide support and resources with identified psychosocial needs;Provide feedback about the scores to participant    Expected Outcomes Long Term Goal: Stressors or current issues are controlled or eliminated.;Short Term goal: Identification and review with participant of any Quality of Life or Depression concerns found by scoring the questionnaire.;Long Term goal: The participant improves quality of Life and PHQ9 Scores as seen by post scores and/or verbalization of changes          Quality of Life Scores:  Quality of Life - 09/29/23 1248       Quality of Life   Select Quality of Life      Quality of Life  Scores   Health/Function Pre 21.69 %    Socioeconomic Pre 23 %    Psych/Spiritual Pre 26.57 %    Family Pre 28.5 %    GLOBAL Pre 24 %         Scores of 19 and below usually indicate a poorer quality of life in these areas.  A difference of  2-3 points is a clinically meaningful difference.  A difference of 2-3 points in the total score of the Quality of Life Index has been associated with significant improvement in overall quality of life, self-image, physical symptoms, and general health in studies assessing change in quality of life.  PHQ-9: Review Flowsheet  More data exists      09/29/2023 09/19/2023 04/25/2023 04/14/2023 02/17/2023  Depression screen PHQ 2/9  Decreased Interest 0 0 0 0 0  Down, Depressed, Hopeless 0 0 0 0 0  PHQ - 2 Score 0 0 0 0 0  Altered sleeping 0 0 0 0 0  Tired, decreased energy 0 0 0  0 0  Change in appetite 0 0 0 0 0  Feeling bad or failure about yourself  0 0 0 0 0  Trouble concentrating 0 0 0 0 0  Moving slowly or fidgety/restless 0 0 0 0 0  Suicidal thoughts 0 0 0 0 0  PHQ-9 Score 0 0 0 0 0   Interpretation of Total Score  Total Score Depression Severity:  1-4 = Minimal depression, 5-9 = Mild depression, 10-14 = Moderate depression, 15-19 = Moderately severe depression, 20-27 = Severe depression   Psychosocial Evaluation and Intervention:   Psychosocial Re-Evaluation:   Psychosocial Discharge (Final Psychosocial Re-Evaluation):   Vocational Rehabilitation: Provide vocational rehab assistance to qualifying candidates.   Vocational Rehab Evaluation & Intervention:  Vocational Rehab - 09/29/23 1254       Initial Vocational Rehab Evaluation & Intervention   Assessment shows need for Vocational Rehabilitation No   Patient is retired         Education: Education Goals: Education classes will be provided on a weekly basis, covering required topics. Participant will state understanding/return demonstration of topics presented.     Core  Videos: Exercise    Move It!  Clinical staff conducted group or individual video education with verbal and written material and guidebook.  Patient learns the recommended Pritikin exercise program. Exercise with the goal of living a long, healthy life. Some of the health benefits of exercise include controlled diabetes, healthier blood pressure levels, improved cholesterol levels, improved heart and lung capacity, improved sleep, and better body composition. Everyone should speak with their doctor before starting or changing an exercise routine.  Biomechanical Limitations Clinical staff conducted group or individual video education with verbal and written material and guidebook.  Patient learns how biomechanical limitations can impact exercise and how we can mitigate and possibly overcome limitations to have an impactful and balanced exercise routine.  Body Composition Clinical staff conducted group or individual video education with verbal and written material and guidebook.  Patient learns that body composition (ratio of muscle mass to fat mass) is a key component to assessing overall fitness, rather than body weight alone. Increased fat mass, especially visceral belly fat, can put us  at increased risk for metabolic syndrome, type 2 diabetes, heart disease, and even death. It is recommended to combine diet and exercise (cardiovascular and resistance training) to improve your body composition. Seek guidance from your physician and exercise physiologist before implementing an exercise routine.  Exercise Action Plan Clinical staff conducted group or individual video education with verbal and written material and guidebook.  Patient learns the recommended strategies to achieve and enjoy long-term exercise adherence, including variety, self-motivation, self-efficacy, and positive decision making. Benefits of exercise include fitness, good health, weight management, more energy, better sleep, less  stress, and overall well-being.  Medical   Heart Disease Risk Reduction Clinical staff conducted group or individual video education with verbal and written material and guidebook.  Patient learns our heart is our most vital organ as it circulates oxygen, nutrients, white blood cells, and hormones throughout the entire body, and carries waste away. Data supports a plant-based eating plan like the Pritikin Program for its effectiveness in slowing progression of and reversing heart disease. The video provides a number of recommendations to address heart disease.   Metabolic Syndrome and Belly Fat  Clinical staff conducted group or individual video education with verbal and written material and guidebook.  Patient learns what metabolic syndrome is, how it leads to heart disease, and how one  can reverse it and keep it from coming back. You have metabolic syndrome if you have 3 of the following 5 criteria: abdominal obesity, high blood pressure, high triglycerides, low HDL cholesterol, and high blood sugar.  Hypertension and Heart Disease Clinical staff conducted group or individual video education with verbal and written material and guidebook.  Patient learns that high blood pressure, or hypertension, is very common in the [place] . Hypertension is largely due to excessive salt intake, but other important risk factors include being overweight, physical inactivity, drinking too much alcohol, smoking, and not eating enough potassium from fruits and vegetables. High blood pressure is a leading risk factor for heart attack, stroke, congestive heart failure, dementia, kidney failure, and premature death. Long-term effects of excessive salt intake include stiffening of the arteries and thickening of heart muscle and organ damage. Recommendations include ways to reduce hypertension and the risk of heart disease.  Diseases of Our Time - Focusing on Diabetes Clinical staff conducted group or individual  video education with verbal and written material and guidebook.  Patient learns why the best way to stop diseases of our time is prevention, through food and other lifestyle changes. Medicine (such as prescription pills and surgeries) is often only a Band-Aid on the problem, not a long-term solution. Most common diseases of our time include obesity, type 2 diabetes, hypertension, heart disease, and cancer. The Pritikin Program is recommended and has been proven to help reduce, reverse, and/or prevent the damaging effects of metabolic syndrome.  Nutrition   Overview of the Pritikin Eating Plan  Clinical staff conducted group or individual video education with verbal and written material and guidebook.  Patient learns about the Pritikin Eating Plan for disease risk reduction. The Pritikin Eating Plan emphasizes a wide variety of unrefined, minimally-processed carbohydrates, like fruits, vegetables, whole grains, and legumes. Go, Caution, and Stop food choices are explained. Plant-based and lean animal proteins are emphasized. Rationale provided for low sodium intake for blood pressure control, low added sugars for blood sugar stabilization, and low added fats and oils for coronary artery disease risk reduction and weight management.  Calorie Density  Clinical staff conducted group or individual video education with verbal and written material and guidebook.  Patient learns about calorie density and how it impacts the Pritikin Eating Plan. Knowing the characteristics of the food you choose will help you decide whether those foods will lead to weight gain or weight loss, and whether you want to consume more or less of them. Weight loss is usually a side effect of the Pritikin Eating Plan because of its focus on low calorie-dense foods.  Label Reading  Clinical staff conducted group or individual video education with verbal and written material and guidebook.  Patient learns about the Pritikin recommended  label reading guidelines and corresponding recommendations regarding calorie density, added sugars, sodium content, and whole grains.  Dining Out - Part 1  Clinical staff conducted group or individual video education with verbal and written material and guidebook.  Patient learns that restaurant meals can be sabotaging because they can be so high in calories, fat, sodium, and/or sugar. Patient learns recommended strategies on how to positively address this and avoid unhealthy pitfalls.  Facts on Fats  Clinical staff conducted group or individual video education with verbal and written material and guidebook.  Patient learns that lifestyle modifications can be just as effective, if not more so, as many medications for lowering your risk of heart disease. A Pritikin lifestyle can help to  reduce your risk of inflammation and atherosclerosis (cholesterol build-up, or plaque, in the artery walls). Lifestyle interventions such as dietary choices and physical activity address the cause of atherosclerosis. A review of the types of fats and their impact on blood cholesterol levels, along with dietary recommendations to reduce fat intake is also included.  Nutrition Action Plan  Clinical staff conducted group or individual video education with verbal and written material and guidebook.  Patient learns how to incorporate Pritikin recommendations into their lifestyle. Recommendations include planning and keeping personal health goals in mind as an important part of their success.  Healthy Mind-Set    Healthy Minds, Bodies, Hearts  Clinical staff conducted group or individual video education with verbal and written material and guidebook.  Patient learns how to identify when they are stressed. Video will discuss the impact of that stress, as well as the many benefits of stress management. Patient will also be introduced to stress management techniques. The way we think, act, and feel has an impact on our  hearts.  How Our Thoughts Can Heal Our Hearts  Clinical staff conducted group or individual video education with verbal and written material and guidebook.  Patient learns that negative thoughts can cause depression and anxiety. This can result in negative lifestyle behavior and serious health problems. Cognitive behavioral therapy is an effective method to help control our thoughts in order to change and improve our emotional outlook.  Additional Videos:  Exercise    Improving Performance  Clinical staff conducted group or individual video education with verbal and written material and guidebook.  Patient learns to use a non-linear approach by alternating intensity levels and lengths of time spent exercising to help burn more calories and lose more body fat. Cardiovascular exercise helps improve heart health, metabolism, hormonal balance, blood sugar control, and recovery from fatigue. Resistance training improves strength, endurance, balance, coordination, reaction time, metabolism, and muscle mass. Flexibility exercise improves circulation, posture, and balance. Seek guidance from your physician and exercise physiologist before implementing an exercise routine and learn your capabilities and proper form for all exercise.  Introduction to Yoga  Clinical staff conducted group or individual video education with verbal and written material and guidebook.  Patient learns about yoga, a discipline of the coming together of mind, breath, and body. The benefits of yoga include improved flexibility, improved range of motion, better posture and core strength, increased lung function, weight loss, and positive self-image. Yoga's heart health benefits include lowered blood pressure, healthier heart rate, decreased cholesterol and triglyceride levels, improved immune function, and reduced stress. Seek guidance from your physician and exercise physiologist before implementing an exercise routine and learn your  capabilities and proper form for all exercise.  Medical   Aging: Enhancing Your Quality of Life  Clinical staff conducted group or individual video education with verbal and written material and guidebook.  Patient learns key strategies and recommendations to stay in good physical health and enhance quality of life, such as prevention strategies, having an advocate, securing a Health Care Proxy and Power of Attorney, and keeping a list of medications and system for tracking them. It also discusses how to avoid risk for bone loss.  Biology of Weight Control  Clinical staff conducted group or individual video education with verbal and written material and guidebook.  Patient learns that weight gain occurs because we consume more calories than we burn (eating more, moving less). Even if your body weight is normal, you may have higher ratios of fat compared  to muscle mass. Too much body fat puts you at increased risk for cardiovascular disease, heart attack, stroke, type 2 diabetes, and obesity-related cancers. In addition to exercise, following the Pritikin Eating Plan can help reduce your risk.  Decoding Lab Results  Clinical staff conducted group or individual video education with verbal and written material and guidebook.  Patient learns that lab test reflects one measurement whose values change over time and are influenced by many factors, including medication, stress, sleep, exercise, food, hydration, pre-existing medical conditions, and more. It is recommended to use the knowledge from this video to become more involved with your lab results and evaluate your numbers to speak with your doctor.   Diseases of Our Time - Overview  Clinical staff conducted group or individual video education with verbal and written material and guidebook.  Patient learns that according to the CDC, 50% to 70% of chronic diseases (such as obesity, type 2 diabetes, elevated lipids, hypertension, and heart disease) are  avoidable through lifestyle improvements including healthier food choices, listening to satiety cues, and increased physical activity.  Sleep Disorders Clinical staff conducted group or individual video education with verbal and written material and guidebook.  Patient learns how good quality and duration of sleep are important to overall health and well-being. Patient also learns about sleep disorders and how they impact health along with recommendations to address them, including discussing with a physician.  Nutrition  Dining Out - Part 2 Clinical staff conducted group or individual video education with verbal and written material and guidebook.  Patient learns how to plan ahead and communicate in order to maximize their dining experience in a healthy and nutritious manner. Included are recommended food choices based on the type of restaurant the patient is visiting.   Fueling a Banker conducted group or individual video education with verbal and written material and guidebook.  There is a strong connection between our food choices and our health. Diseases like obesity and type 2 diabetes are very prevalent and are in large-part due to lifestyle choices. The Pritikin Eating Plan provides plenty of food and hunger-curbing satisfaction. It is easy to follow, affordable, and helps reduce health risks.  Menu Workshop  Clinical staff conducted group or individual video education with verbal and written material and guidebook.  Patient learns that restaurant meals can sabotage health goals because they are often packed with calories, fat, sodium, and sugar. Recommendations include strategies to plan ahead and to communicate with the manager, chef, or server to help order a healthier meal.  Planning Your Eating Strategy  Clinical staff conducted group or individual video education with verbal and written material and guidebook.  Patient learns about the Pritikin Eating Plan and  its benefit of reducing the risk of disease. The Pritikin Eating Plan does not focus on calories. Instead, it emphasizes high-quality, nutrient-rich foods. By knowing the characteristics of the foods, we choose, we can determine their calorie density and make informed decisions.  Targeting Your Nutrition Priorities  Clinical staff conducted group or individual video education with verbal and written material and guidebook.  Patient learns that lifestyle habits have a tremendous impact on disease risk and progression. This video provides eating and physical activity recommendations based on your personal health goals, such as reducing LDL cholesterol, losing weight, preventing or controlling type 2 diabetes, and reducing high blood pressure.  Vitamins and Minerals  Clinical staff conducted group or individual video education with verbal and written material and guidebook.  Patient learns different ways to obtain key vitamins and minerals, including through a recommended healthy diet. It is important to discuss all supplements you take with your doctor.   Healthy Mind-Set    Smoking Cessation  Clinical staff conducted group or individual video education with verbal and written material and guidebook.  Patient learns that cigarette smoking and tobacco addiction pose a serious health risk which affects millions of people. Stopping smoking will significantly reduce the risk of heart disease, lung disease, and many forms of cancer. Recommended strategies for quitting are covered, including working with your doctor to develop a successful plan.  Culinary   Becoming a Set designer conducted group or individual video education with verbal and written material and guidebook.  Patient learns that cooking at home can be healthy, cost-effective, quick, and puts them in control. Keys to cooking healthy recipes will include looking at your recipe, assessing your equipment needs, planning ahead,  making it simple, choosing cost-effective seasonal ingredients, and limiting the use of added fats, salts, and sugars.  Cooking - Breakfast and Snacks  Clinical staff conducted group or individual video education with verbal and written material and guidebook.  Patient learns how important breakfast is to satiety and nutrition through the entire day. Recommendations include key foods to eat during breakfast to help stabilize blood sugar levels and to prevent overeating at meals later in the day. Planning ahead is also a key component.  Cooking - Educational psychologist conducted group or individual video education with verbal and written material and guidebook.  Patient learns eating strategies to improve overall health, including an approach to cook more at home. Recommendations include thinking of animal protein as a side on your plate rather than center stage and focusing instead on lower calorie dense options like vegetables, fruits, whole grains, and plant-based proteins, such as beans. Making sauces in large quantities to freeze for later and leaving the skin on your vegetables are also recommended to maximize your experience.  Cooking - Healthy Salads and Dressing Clinical staff conducted group or individual video education with verbal and written material and guidebook.  Patient learns that vegetables, fruits, whole grains, and legumes are the foundations of the Pritikin Eating Plan. Recommendations include how to incorporate each of these in flavorful and healthy salads, and how to create homemade salad dressings. Proper handling of ingredients is also covered. Cooking - Soups and [employer] - Soups and Desserts Clinical staff conducted group or individual video education with verbal and written material and guidebook.  Patient learns that Pritikin soups and desserts make for easy, nutritious, and delicious snacks and meal components that are low in sodium, fat, sugar, and  calorie density, while high in vitamins, minerals, and filling fiber. Recommendations include simple and healthy ideas for soups and desserts.   Overview     The Pritikin Solution Program Overview Clinical staff conducted group or individual video education with verbal and written material and guidebook.  Patient learns that the results of the Pritikin Program have been documented in more than 100 articles published in peer-reviewed journals, and the benefits include reducing risk factors for (and, in some cases, even reversing) high cholesterol, high blood pressure, type 2 diabetes, obesity, and more! An overview of the three key pillars of the Pritikin Program will be covered: eating well, doing regular exercise, and having a healthy mind-set.  WORKSHOPS  Exercise: Exercise Basics: Building Your Action Plan Clinical staff led group instruction and  group discussion with PowerPoint presentation and patient guidebook. To enhance the learning environment the use of posters, models and videos may be added. At the conclusion of this workshop, patients will comprehend the difference between physical activity and exercise, as well as the benefits of incorporating both, into their routine. Patients will understand the FITT (Frequency, Intensity, Time, and Type) principle and how to use it to build an exercise action plan. In addition, safety concerns and other considerations for exercise and cardiac rehab will be addressed by the presenter. The purpose of this lesson is to promote a comprehensive and effective weekly exercise routine in order to improve patients' overall level of fitness.   Managing Heart Disease: Your Path to a Healthier Heart Clinical staff led group instruction and group discussion with PowerPoint presentation and patient guidebook. To enhance the learning environment the use of posters, models and videos may be added.At the conclusion of this workshop, patients will understand the  anatomy and physiology of the heart. Additionally, they will understand how Pritikin's three pillars impact the risk factors, the progression, and the management of heart disease.  The purpose of this lesson is to provide a high-level overview of the heart, heart disease, and how the Pritikin lifestyle positively impacts risk factors.  Exercise Biomechanics Clinical staff led group instruction and group discussion with PowerPoint presentation and patient guidebook. To enhance the learning environment the use of posters, models and videos may be added. Patients will learn how the structural parts of their bodies function and how these functions impact their daily activities, movement, and exercise. Patients will learn how to promote a neutral spine, learn how to manage pain, and identify ways to improve their physical movement in order to promote healthy living. The purpose of this lesson is to expose patients to common physical limitations that impact physical activity. Participants will learn practical ways to adapt and manage aches and pains, and to minimize their effect on regular exercise. Patients will learn how to maintain good posture while sitting, walking, and lifting.  Balance Training and Fall Prevention  Clinical staff led group instruction and group discussion with PowerPoint presentation and patient guidebook. To enhance the learning environment the use of posters, models and videos may be added. At the conclusion of this workshop, patients will understand the importance of their sensorimotor skills (vision, proprioception, and the vestibular system) in maintaining their ability to balance as they age. Patients will apply a variety of balancing exercises that are appropriate for their current level of function. Patients will understand the common causes for poor balance, possible solutions to these problems, and ways to modify their physical environment in order to minimize their  fall risk. The purpose of this lesson is to teach patients about the importance of maintaining balance as they age and ways to minimize their risk of falling.  WORKSHOPS   Nutrition:  Fueling a Ship broker led group instruction and group discussion with PowerPoint presentation and patient guidebook. To enhance the learning environment the use of posters, models and videos may be added. Patients will review the foundational principles of the Pritikin Eating Plan and understand what constitutes a serving size in each of the food groups. Patients will also learn Pritikin-friendly foods that are better choices when away from home and review make-ahead meal and snack options. Calorie density will be reviewed and applied to three nutrition priorities: weight maintenance, weight loss, and weight gain. The purpose of this lesson is to reinforce (in a group setting)  the key concepts around what patients are recommended to eat and how to apply these guidelines when away from home by planning and selecting Pritikin-friendly options. Patients will understand how calorie density may be adjusted for different weight management goals.  Mindful Eating  Clinical staff led group instruction and group discussion with PowerPoint presentation and patient guidebook. To enhance the learning environment the use of posters, models and videos may be added. Patients will briefly review the concepts of the Pritikin Eating Plan and the importance of low-calorie dense foods. The concept of mindful eating will be introduced as well as the importance of paying attention to internal hunger signals. Triggers for non-hunger eating and techniques for dealing with triggers will be explored. The purpose of this lesson is to provide patients with the opportunity to review the basic principles of the Pritikin Eating Plan, discuss the value of eating mindfully and how to measure internal cues of hunger and fullness using the  Hunger Scale. Patients will also discuss reasons for non-hunger eating and learn strategies to use for controlling emotional eating.  Targeting Your Nutrition Priorities Clinical staff led group instruction and group discussion with PowerPoint presentation and patient guidebook. To enhance the learning environment the use of posters, models and videos may be added. Patients will learn how to determine their genetic susceptibility to disease by reviewing their family history. Patients will gain insight into the importance of diet as part of an overall healthy lifestyle in mitigating the impact of genetics and other environmental insults. The purpose of this lesson is to provide patients with the opportunity to assess their personal nutrition priorities by looking at their family history, their own health history and current risk factors. Patients will also be able to discuss ways of prioritizing and modifying the Pritikin Eating Plan for their highest risk areas  Menu  Clinical staff led group instruction and group discussion with PowerPoint presentation and patient guidebook. To enhance the learning environment the use of posters, models and videos may be added. Using menus brought in from [employer], or printed from [employer], patients will apply the Pritikin dining out guidelines that were presented in the [employer] video. Patients will also be able to practice these guidelines in a variety of provided scenarios. The purpose of this lesson is to provide patients with the opportunity to practice hands-on learning of the Pritikin Dining Out guidelines with actual menus and practice scenarios.  Label Reading Clinical staff led group instruction and group discussion with PowerPoint presentation and patient guidebook. To enhance the learning environment the use of posters, models and videos may be added. Patients will review and discuss the Pritikin label reading guidelines  presented in Pritikin's Label Reading Educational series video. Using fool labels brought in from local grocery stores and markets, patients will apply the label reading guidelines and determine if the packaged food meet the Pritikin guidelines. The purpose of this lesson is to provide patients with the opportunity to review, discuss, and practice hands-on learning of the Pritikin Label Reading guidelines with actual packaged food labels. Cooking School  Pritikin's [employer] are designed to teach patients ways to prepare quick, simple, and affordable recipes at home. The importance of nutrition's role in chronic disease risk reduction is reflected in its emphasis in the overall Pritikin program. By learning how to prepare essential core Pritikin Eating Plan recipes, patients will increase control over what they eat; be able to customize the flavor of foods without the use of  added salt, sugar, or fat; and improve the quality of the food they consume. By learning a set of core recipes which are easily assembled, quickly prepared, and affordable, patients are more likely to prepare more healthy foods at home. These workshops focus on convenient breakfasts, simple entres, side dishes, and desserts which can be prepared with minimal effort and are consistent with nutrition recommendations for cardiovascular risk reduction. Cooking [employer] are taught by a Armed forces logistics/support/administrative officer (RD) who has been trained by the [employer]. The chef or RD has a clear understanding of the importance of minimizing - if not completely eliminating - added fat, sugar, and sodium in recipes. Throughout the series of Cooking School Workshop sessions, patients will learn about healthy ingredients and efficient methods of cooking to build confidence in their capability to prepare    Cooking School weekly topics:  Adding Flavor- Sodium-Free  Fast and Healthy Breakfasts  Powerhouse Plant-Based  Proteins  Satisfying Salads and Dressings  Simple Sides and Sauces  International Cuisine-Spotlight on the [employer] Zones  Delicious Desserts  Savory Soups  [employer] - Meals in a Astronomer Appetizers and Snacks  Comforting Weekend Breakfasts  One-Pot Wonders   Fast Evening Meals  Landscape architect Your Pritikin Plate  WORKSHOPS   Healthy Mindset (Psychosocial):  Focused Goals, Sustainable Changes Clinical staff led group instruction and group discussion with PowerPoint presentation and patient guidebook. To enhance the learning environment the use of posters, models and videos may be added. Patients will be able to apply effective goal setting strategies to establish at least one personal goal, and then take consistent, meaningful action toward that goal. They will learn to identify common barriers to achieving personal goals and develop strategies to overcome them. Patients will also gain an understanding of how our mind-set can impact our ability to achieve goals and the importance of cultivating a positive and growth-oriented mind-set. The purpose of this lesson is to provide patients with a deeper understanding of how to set and achieve personal goals, as well as the tools and strategies needed to overcome common obstacles which may arise along the way.  From Head to Heart: The Power of a Healthy Outlook  Clinical staff led group instruction and group discussion with PowerPoint presentation and patient guidebook. To enhance the learning environment the use of posters, models and videos may be added. Patients will be able to recognize and describe the impact of emotions and mood on physical health. They will discover the importance of self-care and explore self-care practices which may work for them. Patients will also learn how to utilize the 4 C's to cultivate a healthier outlook and better manage stress and challenges. The purpose of this lesson is to demonstrate  to patients how a healthy outlook is an essential part of maintaining good health, especially as they continue their cardiac rehab journey.  Healthy Sleep for a Healthy Heart Clinical staff led group instruction and group discussion with PowerPoint presentation and patient guidebook. To enhance the learning environment the use of posters, models and videos may be added. At the conclusion of this workshop, patients will be able to demonstrate knowledge of the importance of sleep to overall health, well-being, and quality of life. They will understand the symptoms of, and treatments for, common sleep disorders. Patients will also be able to identify daytime and nighttime behaviors which impact sleep, and they will be able to apply these tools to help manage sleep-related challenges. The  purpose of this lesson is to provide patients with a general overview of sleep and outline the importance of quality sleep. Patients will learn about a few of the most common sleep disorders. Patients will also be introduced to the concept of "sleep hygiene," and discover ways to self-manage certain sleeping problems through simple daily behavior changes. Finally, the workshop will motivate patients by clarifying the links between quality sleep and their goals of heart-healthy living.   Recognizing and Reducing Stress Clinical staff led group instruction and group discussion with PowerPoint presentation and patient guidebook. To enhance the learning environment the use of posters, models and videos may be added. At the conclusion of this workshop, patients will be able to understand the types of stress reactions, differentiate between acute and chronic stress, and recognize the impact that chronic stress has on their health. They will also be able to apply different coping mechanisms, such as reframing negative self-talk. Patients will have the opportunity to practice a variety of stress management techniques, such as deep  abdominal breathing, progressive muscle relaxation, and/or guided imagery.  The purpose of this lesson is to educate patients on the role of stress in their lives and to provide healthy techniques for coping with it.  Learning Barriers/Preferences:  Learning Barriers/Preferences - 09/29/23 1249       Learning Barriers/Preferences   Learning Barriers Exercise Concerns   Poor balance   Learning Preferences Group Instruction;Individual Instruction;Skilled Demonstration;Verbal Instruction          Education Topics:  Knowledge Questionnaire Score:  Knowledge Questionnaire Score - 09/29/23 1249       Knowledge Questionnaire Score   Pre Score 20/24          Core Components/Risk Factors/Patient Goals at Admission:  Personal Goals and Risk Factors at Admission - 09/29/23 1256       Core Components/Risk Factors/Patient Goals on Admission    Weight Management Yes;Weight Maintenance    Intervention Weight Management: Develop a combined nutrition and exercise program designed to reach desired caloric intake, while maintaining appropriate intake of nutrient and fiber, sodium and fats, and appropriate energy expenditure required for the weight goal.;Weight Management/Obesity: Establish reasonable short term and long term weight goals.;Weight Management: Provide education and appropriate resources to help participant work on and attain dietary goals.    Expected Outcomes Short Term: Continue to assess and modify interventions until short term weight is achieved;Long Term: Adherence to nutrition and physical activity/exercise program aimed toward attainment of established weight goal;Weight Maintenance: Understanding of the daily nutrition guidelines, which includes 25-35% calories from fat, 7% or less cal from saturated fats, less than 200mg  cholesterol, less than 1.5gm of sodium, & 5 or more servings of fruits and vegetables daily;Understanding recommendations for meals to include 15-35% energy as  protein, 25-35% energy from fat, 35-60% energy from carbohydrates, less than 200mg  of dietary cholesterol, 20-35 gm of total fiber daily;Understanding of distribution of calorie intake throughout the day with the consumption of 4-5 meals/snacks    Hypertension Yes    Intervention Provide education on lifestyle modifcations including regular physical activity/exercise, weight management, moderate sodium restriction and increased consumption of fresh fruit, vegetables, and low fat dairy, alcohol moderation, and smoking cessation.;Monitor prescription use compliance.    Expected Outcomes Short Term: Continued assessment and intervention until BP is < 140/60mm HG in hypertensive participants. < 130/110mm HG in hypertensive participants with diabetes, heart failure or chronic kidney disease.;Long Term: Maintenance of blood pressure at goal levels.    Lipids Yes  Intervention Provide education and support for participant on nutrition & aerobic/resistive exercise along with prescribed medications to achieve LDL 70mg , HDL >40mg .    Expected Outcomes Short Term: Participant states understanding of desired cholesterol values and is compliant with medications prescribed. Participant is following exercise prescription and nutrition guidelines.;Long Term: Cholesterol controlled with medications as prescribed, with individualized exercise RX and with personalized nutrition plan. Value goals: LDL < 70mg , HDL > 40 mg.    Stress Yes    Intervention Offer individual and/or small group education and counseling on adjustment to heart disease, stress management and health-related lifestyle change. Teach and support self-help strategies.;Refer participants experiencing significant psychosocial distress to appropriate mental health specialists for further evaluation and treatment. When possible, include family members and significant others in education/counseling sessions.    Expected Outcomes Short Term: Participant  demonstrates changes in health-related behavior, relaxation and other stress management skills, ability to obtain effective social support, and compliance with psychotropic medications if prescribed.;Long Term: Emotional wellbeing is indicated by absence of clinically significant psychosocial distress or social isolation.    Personal Goal Other Yes    Personal Goal ST: know limits (signs and symptoms). LT: Independance, back to ADL's    Intervention Will continue to monitor pt and progress workloads as tolerated without sign or symptom    Expected Outcomes Pt will achieve her goals and increase strength          Core Components/Risk Factors/Patient Goals Review:    Core Components/Risk Factors/Patient Goals at Discharge (Final Review):    ITP Comments:  ITP Comments     Row Name 09/29/23 0921           ITP Comments Wilbert Bihari, MD.  Introduction to the [facility] / Intensive Cardiac Rehab.  Initial orientation packet reviewed with the patient.          Comments: Participant attended orientation for the cardiac rehabilitation program on  09/29/2023  to perform initial intake and exercise walk test. Patient introduced to the [facility] education and orientation packet was reviewed. Completed 6-minute walk test, measurements, initial ITP, and exercise prescription. Vital signs stable. Telemetry-normal sinus rhythm with inverted and depressed T-wave with occasional PVC's. Onsite provider and nurse aware, asymptomatic throughout.   Service time was from 0915 to 1140.

## 2023-10-03 ENCOUNTER — Ambulatory Visit (HOSPITAL_COMMUNITY)
Admission: RE | Admit: 2023-10-03 | Discharge: 2023-10-03 | Disposition: A | Discharge status: Home / Self Care | Source: Ambulatory Visit | Attending: Cardiology | Admitting: Cardiology

## 2023-10-03 ENCOUNTER — Ambulatory Visit (HOSPITAL_COMMUNITY): Payer: Self-pay | Admitting: Cardiology

## 2023-10-03 DIAGNOSIS — I5022 Chronic systolic (congestive) heart failure: Secondary | ICD-10-CM | POA: Insufficient documentation

## 2023-10-03 LAB — BASIC METABOLIC PANEL WITH GFR
Anion gap: 9 (ref 5–15)
BUN: 14 mg/dL (ref 8–23)
CO2: 29 mmol/L (ref 22–32)
Calcium: 9.3 mg/dL (ref 8.9–10.3)
Chloride: 96 mmol/L — ABNORMAL LOW (ref 98–111)
Creatinine, Ser: 0.73 mg/dL (ref 0.44–1.00)
GFR, Estimated: >60 mL/min (ref >60)
Glucose, Bld: 111 mg/dL — ABNORMAL HIGH (ref 70–99)
Potassium: 3.8 mmol/L (ref 3.5–5.1)
Sodium: 134 mmol/L — ABNORMAL LOW (ref 135–145)

## 2023-10-05 ENCOUNTER — Encounter (HOSPITAL_COMMUNITY)
Admission: RE | Admit: 2023-10-05 | Discharge: 2023-10-05 | Disposition: A | Discharge status: Home / Self Care | Source: Ambulatory Visit | Attending: Cardiology | Admitting: Cardiology

## 2023-10-05 DIAGNOSIS — I214 Non-ST elevation (NSTEMI) myocardial infarction: Secondary | ICD-10-CM | POA: Diagnosis present

## 2023-10-05 NOTE — Progress Notes (Signed)
 Daily Session Note  Patient Details  Name: Kelly Snyder MRN: [id number] Date of Birth: 1945-06-05 Referring Provider:   Flowsheet Row INTENSIVE CARDIAC REHAB ORIENT from 09/29/2023 in [facility]  Referring Provider Dr. Newman Lawrence, MD    Encounter Date: 10/05/2023  Check In:  Session Check In - 10/05/23 1056       Check-In   Supervising physician immediately available to respond to emergencies [facility] MD immediately available    Physician(s) Lum Louis, NP    Location [facility]    Staff Present Alec Finder BS, ACSM-CEP, Exercise Physiologist;Joseph Lennon, RN, Avonne Gal, MS, ACSM-CEP, Exercise Physiologist;David Makemson, MS, ACSM-CEP, CCRP, Exercise Physiologist    Virtual Visit No    Medication changes reported     No    Fall or balance concerns reported    No    Tobacco Cessation No Change    Warm-up and Cool-down Performed as group-led instruction   CRP2 orientation today   Resistance Training Performed No    VAD Patient? No    PAD/SET Patient? No      Pain Assessment   Currently in Pain? No/denies    Pain Score 0-No pain    Multiple Pain Sites No          Capillary Blood Glucose: No results found for this or any previous visit (from the past 24 hours).   Exercise Prescription Changes - 10/05/23 1033       Response to Exercise   Blood Pressure (Admit) 138/66    Blood Pressure (Exercise) 130/60    Blood Pressure (Exit) 118/60    Heart Rate (Admit) 69 bpm    Heart Rate (Exercise) 85 bpm    Heart Rate (Exit) 71 bpm    Rating of Perceived Exertion (Exercise) 13    Symptoms None    Comments Off to a fair start with exercise.    Duration Progress to 30 minutes of  aerobic without signs/symptoms of physical distress    Intensity THRR unchanged      Progression   Progression Continue to progress workloads to maintain intensity without signs/symptoms of physical  distress.    Average METs 1.3      Resistance Training   Training Prescription No    Weight relaxation day. No weights.      Interval Training   Interval Training No      NuStep   Level 1    SPM 37    Minutes 21    METs 13          Social History   Tobacco Use  Smoking Status Former   Current packs/day: 1.00   Average packs/day: 1 pack/day for 57.7 years (57.7 ttl pk-yrs)   Types: Cigarettes   Start date: 01/04/1966   Passive exposure: Past  Smokeless Tobacco Never  Tobacco Comments   quit 10/28/2018    Goals Met:  Exercise tolerated well No report of concerns or symptoms today  Goals Unmet:  Not Applicable  Comments: [name] started the cardiac rehab program today. She tolerated low intensity exercise fair, completing 21 minutes on the recumbent stepper. Her blood pressure and heart rate were within normal limits. Telemetry showed T-wave inversion on par with her EKG at orientation with occasional PVCs. She remained asymptomatic throughout exercise. Will continue to monitor.   Dr. Wilbert Bihari is Medical Director for Cardiac Rehab at [facility].

## 2023-10-07 ENCOUNTER — Encounter (HOSPITAL_COMMUNITY)
Admission: RE | Admit: 2023-10-07 | Discharge: 2023-10-07 | Disposition: A | Discharge status: Home / Self Care | Source: Ambulatory Visit | Attending: Cardiology | Admitting: Cardiology

## 2023-10-07 DIAGNOSIS — I214 Non-ST elevation (NSTEMI) myocardial infarction: Secondary | ICD-10-CM

## 2023-10-10 ENCOUNTER — Encounter (HOSPITAL_COMMUNITY)
Admission: RE | Admit: 2023-10-10 | Discharge: 2023-10-10 | Disposition: A | Source: Ambulatory Visit | Attending: Cardiology

## 2023-10-10 DIAGNOSIS — I214 Non-ST elevation (NSTEMI) myocardial infarction: Secondary | ICD-10-CM | POA: Diagnosis not present

## 2023-10-11 NOTE — Progress Notes (Signed)
 Cardiac Individual Treatment Plan  Patient Details  Name: Kelly Snyder MRN: [id number] Date of Birth: August 06, 1945 Referring Provider:   Flowsheet Row INTENSIVE CARDIAC REHAB ORIENT from 09/29/2023 in [facility], V[facility], & [facility]  Referring Provider Dr. Newman Lawrence, MD    Initial Encounter Date:  Flowsheet Row INTENSIVE CARDIAC REHAB ORIENT from 09/29/2023 in [facility] for Heart, Vascular, & Lung Health  Date 09/29/23    Visit Diagnosis: 09/12/23 NSTEMI (non-ST elevated myocardial infarction) [facility])  Patient's Home Medications on Admission:  Current Outpatient Medications:    acetaminophen  (TYLENOL ) 500 MG tablet, Take 1,000 mg by mouth every 6 (six) hours as needed for moderate pain (pain). , Disp: , Rfl:    albuterol  (VENTOLIN  HFA) 108 (90 Base) MCG/ACT inhaler, Inhale 2 puffs into the lungs every 6 (six) hours as needed for wheezing or shortness of breath., Disp: , Rfl:    aspirin  81 MG chewable tablet, Chew 1 tablet (81 mg total) by mouth daily., Disp: , Rfl:    Calcium  Carb-Cholecalciferol (CALCIUM  600-D PO), Take 1 tablet by mouth daily., Disp: , Rfl:    cephALEXin  (KEFLEX ) 500 MG capsule, Take 1 capsule (500 mg total) by mouth 4 (four) times daily. (Patient not taking: Reported on 09/29/2023), Disp: 28 capsule, Rfl: 0   dapagliflozin  propanediol (FARXIGA ) 10 MG TABS tablet, Take 1 tablet (10 mg total) by mouth daily before breakfast., Disp: 30 tablet, Rfl: 11   ezetimibe  (ZETIA ) 10 MG tablet, Take 1 tablet (10 mg total) by mouth daily., Disp: 30 tablet, Rfl: 0   Lidocaine  (ASPERCREME MAX STRENGTH) 4 % AERO, Apply 1 Application topically daily as needed (hip arthritis pain)., Disp: , Rfl:    losartan  (COZAAR ) 25 MG tablet, Take 0.5 tablets (12.5 mg total) by mouth daily., Disp: 30 tablet, Rfl: 0   metoprolol  succinate (TOPROL -XL) 25 MG 24 hr tablet, Take 0.5 tablets (12.5 mg total) by mouth daily., Disp: 30 tablet,  Rfl: 0   Multiple Vitamin (MULTIVITAMIN WITH MINERALS) TABS tablet, Take 1 tablet by mouth daily., Disp: , Rfl:    nitroGLYCERIN  (NITROSTAT ) 0.4 MG SL tablet, Place 1 tablet (0.4 mg total) under the tongue every 5 (five) minutes as needed for chest pain., Disp: 30 tablet, Rfl: 0   pravastatin  (PRAVACHOL ) 20 MG tablet, Take 1 tablet (20 mg total) by mouth daily at 6 PM., Disp: 30 tablet, Rfl: 0   spironolactone  (ALDACTONE ) 25 MG tablet, Take 0.5 tablets (12.5 mg total) by mouth daily., Disp: 45 tablet, Rfl: 3   Tiotropium Bromide  Monohydrate (SPIRIVA  RESPIMAT) 2.5 MCG/ACT AERS, INHALE 2 PUFFS BY MOUTH INTO THE LUNGS DAILY, Disp: 1 each, Rfl: 3  Past Medical History: Past Medical History:  Diagnosis Date   Arthritis    COPD (chronic obstructive pulmonary disease) (HCC)    Pneumonia    Rash 02/17/2023    Tobacco Use: Social History   Tobacco Use  Smoking Status Former   Current packs/day: 1.00   Average packs/day: 1 pack/day for 57.8 years (57.8 ttl pk-yrs)   Types: Cigarettes   Start date: 01/04/1966   Passive exposure: Past  Smokeless Tobacco Never  Tobacco Comments   quit 10/28/2018    Labs: Review Flowsheet  More data exists      Latest Ref Rng & Units 02/04/2021 12/28/2021 01/15/2023 09/13/2023 09/16/2023  Labs for ITP Cardiac and Pulmonary Rehab  Cholestrol 0 - 200 mg/dL - - 801  793  -  LDL (calc) 0 -  99 mg/dL - - 882  884  -  Direct LDL 0 - 99 mg/dL 863  - - - -  HDL-C >59 mg/dL - - 74  84  -  Trlycerides <150 mg/dL - - 37  34  -  Hemoglobin A1c 4.8 - 5.6 % - - 5.2  - -  PH, Arterial 7.35 - 7.45 - - - 7.395  -  PCO2 arterial 32 - 48 mmHg - - - 43.5  -  Bicarbonate 20.0 - 28.0 mmol/L - - - 27.4  26.6  -  TCO2 22 - 32 mmol/L - 29  - 29  28  29    O2 Saturation % - - 98.2  54  87  -    Details       Multiple values from one day are sorted in reverse-chronological order          Exercise Target Goals: Exercise Program Goal: Individual exercise prescription set  using results from initial 6 min walk test and THRR while considering  patient's activity barriers and safety.   Exercise Prescription Goal: Initial exercise prescription builds to 30-45 minutes a day of aerobic activity, 2-3 days per week.  Home exercise guidelines will be given to patient during program as part of exercise prescription that the participant will acknowledge.   Education: Aerobic Exercise: - Group verbal and visual presentation on the components of exercise prescription. Introduces F.I.T.T principle from ACSM for exercise prescriptions.  Reviews F.I.T.T. principles of aerobic exercise including progression. Written material provided at class time.   Education: Resistance Exercise: - Group verbal and visual presentation on the components of exercise prescription. Introduces F.I.T.T principle from ACSM for exercise prescriptions  Reviews F.I.T.T. principles of resistance exercise including progression. Written material provided at class time.    Education: Exercise & Equipment Safety: - Individual verbal instruction and demonstration of equipment use and safety with use of the equipment.   Education: Exercise Physiology & General Exercise Guidelines: - Group verbal and written instruction with models to review the exercise physiology of the cardiovascular system and associated critical values. Provides general exercise guidelines with specific guidelines to those with heart or lung disease. Written material provided at class time.   Education: Flexibility, Balance, Mind/Body Relaxation: - Group verbal and visual presentation with interactive activity on the components of exercise prescription. Introduces F.I.T.T principle from ACSM for exercise prescriptions. Reviews F.I.T.T. principles of flexibility and balance exercise training including progression. Also discusses the mind body connection.  Reviews various relaxation techniques to help reduce and manage stress (i.e. Deep  breathing, progressive muscle relaxation, and visualization). Balance handout provided to take home. Written material provided at class time.   Activity Barriers & Risk Stratification:  Activity Barriers & Cardiac Risk Stratification - 09/29/23 1243       Activity Barriers & Cardiac Risk Stratification   Activity Barriers Joint Problems;Deconditioning;Arthritis;Balance Concerns    Cardiac Risk Stratification High          6 Minute Walk:  6 Minute Walk     Row Name 09/29/23 1232         6 Minute Walk   Phase Initial     Distance 720 feet     Walk Time 6 minutes     # of Rest Breaks 0     MPH 1.36     METS 1.52     RPE 11     Perceived Dyspnea  0     VO2 Peak 5.33  Symptoms No     Resting HR 74 bpm     Resting BP 112/58     Resting Oxygen Saturation  94 %     Exercise Oxygen Saturation  during 6 min walk 98 %     Max Ex. HR 90 bpm     Max Ex. BP 128/64     2 Minute Post BP 114/60        Oxygen Initial Assessment:   Oxygen Re-Evaluation:   Oxygen Discharge (Final Oxygen Re-Evaluation):   Initial Exercise Prescription:  Initial Exercise Prescription - 09/29/23 1200       Date of Initial Exercise RX and Referring Provider   Date 09/29/23    Referring Provider Dr. Newman Lawrence, MD    Expected Discharge Date 12/23/23      NuStep   Level 1    SPM 65    Minutes 25    METs 1.5      Prescription Details   Frequency (times per week) 3    Duration Progress to 30 minutes of continuous aerobic without signs/symptoms of physical distress      Intensity   THRR 40-80% of Max Heartrate 57-114    Ratings of Perceived Exertion 11-13    Perceived Dyspnea 0-4      Progression   Progression Continue progressive overload as per policy without signs/symptoms or physical distress.      Resistance Training   Training Prescription Yes    Weight 1    Reps 10-15          Perform Capillary Blood Glucose checks as needed.  Exercise Prescription  Changes:   Exercise Prescription Changes     Row Name 10/05/23 1033 10/10/23 1027           Response to Exercise   Blood Pressure (Admit) 138/66 120/60      Blood Pressure (Exercise) 130/60 124/60      Blood Pressure (Exit) 118/60 98/64      Heart Rate (Admit) 69 bpm 72 bpm      Heart Rate (Exercise) 85 bpm 94 bpm      Heart Rate (Exit) 71 bpm 78 bpm      Rating of Perceived Exertion (Exercise) 13 12      Symptoms None None      Comments Off to a fair start with exercise. --      Duration Progress to 30 minutes of  aerobic without signs/symptoms of physical distress Progress to 30 minutes of  aerobic without signs/symptoms of physical distress      Intensity THRR unchanged THRR unchanged        Progression   Progression Continue to progress workloads to maintain intensity without signs/symptoms of physical distress. Continue to progress workloads to maintain intensity without signs/symptoms of physical distress.      Average METs 1.3 2        Resistance Training   Training Prescription No Yes      Weight relaxation day. No weights. 1 lb wts      Reps -- 10-15      Time -- 5 Minutes        Interval Training   Interval Training No No        NuStep   Level 1 1      SPM 37 66      Minutes 21 25      METs 1.3 2         Exercise Comments:   Exercise Comments  Row Name 10/05/23 1134           Exercise Comments Jakiya tolerated low intensity exercise for 21 minutes fair without symptoms. She was oreinted to the execise equipment and stretching routine.          Exercise Goals and Review:   Exercise Goals     Row Name 09/29/23 0923             Exercise Goals   Increase Physical Activity Yes       Intervention Provide advice, education, support and counseling about physical activity/exercise needs.;Develop an individualized exercise prescription for aerobic and resistive training based on initial evaluation findings, risk stratification, comorbidities and  participant's personal goals.       Expected Outcomes Short Term: Attend rehab on a regular basis to increase amount of physical activity.;Long Term: Add in home exercise to make exercise part of routine and to increase amount of physical activity.;Long Term: Exercising regularly at least 3-5 days a week.       Increase Strength and Stamina Yes       Intervention Provide advice, education, support and counseling about physical activity/exercise needs.;Develop an individualized exercise prescription for aerobic and resistive training based on initial evaluation findings, risk stratification, comorbidities and participant's personal goals.       Expected Outcomes Short Term: Increase workloads from initial exercise prescription for resistance, speed, and METs.;Short Term: Perform resistance training exercises routinely during rehab and add in resistance training at home;Long Term: Improve cardiorespiratory fitness, muscular endurance and strength as measured by increased METs and functional capacity ( )       Able to understand and use rate of perceived exertion (RPE) scale Yes       Intervention Provide education and explanation on how to use RPE scale       Expected Outcomes Short Term: Able to use RPE daily in rehab to express subjective intensity level;Long Term:  Able to use RPE to guide intensity level when exercising independently       Knowledge and understanding of Target Heart Rate Range (THRR) Yes       Intervention Provide education and explanation of THRR including how the numbers were predicted and where they are located for reference       Expected Outcomes Short Term: Able to state/look up THRR;Short Term: Able to use daily as guideline for intensity in rehab;Long Term: Able to use THRR to govern intensity when exercising independently       Understanding of Exercise Prescription Yes       Intervention Provide education, explanation, and written materials on patient's individual exercise  prescription       Expected Outcomes Short Term: Able to explain program exercise prescription;Long Term: Able to explain home exercise prescription to exercise independently          Exercise Goals Re-Evaluation :  Exercise Goals Re-Evaluation     Row Name 10/05/23 1134             Exercise Goal Re-Evaluation   Exercise Goals Review Increase Physical Activity;Increase Strength and Stamina;Able to understand and use rate of perceived exertion (RPE) scale       Comments Syla was able to understand and use RPE scale appropriately.       Expected Outcomes Increase duration from 21 minutes to 30 minutes. Progress workloads as tolerated.          Discharge Exercise Prescription (Final Exercise Prescription Changes):  Exercise Prescription Changes - 10/10/23 1027  Response to Exercise   Blood Pressure (Admit) 120/60    Blood Pressure (Exercise) 124/60    Blood Pressure (Exit) 98/64    Heart Rate (Admit) 72 bpm    Heart Rate (Exercise) 94 bpm    Heart Rate (Exit) 78 bpm    Rating of Perceived Exertion (Exercise) 12    Symptoms None    Duration Progress to 30 minutes of  aerobic without signs/symptoms of physical distress    Intensity THRR unchanged      Progression   Progression Continue to progress workloads to maintain intensity without signs/symptoms of physical distress.    Average METs 2      Resistance Training   Training Prescription Yes    Weight 1 lb wts    Reps 10-15    Time 5 Minutes      Interval Training   Interval Training No      NuStep   Level 1    SPM 66    Minutes 25    METs 2          Nutrition:  Target Goals: Understanding of nutrition guidelines, daily intake of sodium 1500mg , cholesterol 200mg , calories 30% from fat and 7% or less from saturated fats, daily to have 5 or more servings of fruits and vegetables.  Education: Nutrition 1 -Group instruction provided by verbal, written material, interactive activities, discussions,  models, and posters to present general guidelines for heart healthy nutrition including macronutrients, label reading, and promoting whole foods over processed counterparts. Education serves as Pensions consultant of discussion of heart healthy eating for all. Written material provided at class time.    Education: Nutrition 2 -Group instruction provided by verbal, written material, interactive activities, discussions, models, and posters to present general guidelines for heart healthy nutrition including sodium, cholesterol, and saturated fat. Providing guidance of habit forming to improve blood pressure, cholesterol, and body weight. Written material provided at class time.     Biometrics:  Pre Biometrics - 09/29/23 0921       Pre Biometrics   Waist Circumference 32 inches    Hip Circumference 41.5 inches    Waist to Hip Ratio 0.77 %    Triceps Skinfold 15 mm    % Body Fat 33.4 %    Grip Strength 10 kg    Flexibility 11.5 in    Single Leg Stand 3 seconds           Nutrition Therapy Plan and Nutrition Goals:   Nutrition Assessments:  MEDIFICTS Score Key: >=70 Need to make dietary changes  40-70 Heart Healthy Diet <= 40 Therapeutic Level Cholesterol Diet  Flowsheet Row INTENSIVE CARDIAC REHAB from 10/05/2023 in [facility] Heart, Vascular, & Lung [facility]  Picture Your Plate Total Score on Admission 77   Picture Your Plate Scores: <59 Unhealthy dietary pattern with much room for improvement. 41-50 Dietary pattern unlikely to meet recommendations for good health and room for improvement. 51-60 More healthful dietary pattern, with some room for improvement.  >60 Healthy dietary pattern, although there may be some specific behaviors that could be improved.    Nutrition Goals Re-Evaluation:   Nutrition Goals Discharge (Final Nutrition Goals Re-Evaluation):   Psychosocial: Target Goals: Acknowledge presence or absence of significant depression and/or  stress, maximize coping skills, provide positive support system. Participant is able to verbalize types and ability to use techniques and skills needed for reducing stress and depression.   Education: Stress, Anxiety, and Depression - Group verbal and visual  presentation to define topics covered.  Reviews how body is impacted by stress, anxiety, and depression.  Also discusses healthy ways to reduce stress and to treat/manage anxiety and depression. Written material provided at class time.   Education: Sleep Hygiene -Provides group verbal and written instruction about how sleep can affect your health.  Define sleep hygiene, discuss sleep cycles and impact of sleep habits. Review good sleep hygiene tips.   Initial Review & Psychosocial Screening:  Initial Psych Review & Screening - 09/29/23 1247       Initial Review   Current issues with Current Stress Concerns    Source of Stress Concerns Family    Comments Some mild stress with the well being of her family. Overal she feels good and has a good support group with her church family, son and his girlfriend. No needs at this time      Family Dynamics   Good Support System? Yes      Barriers   Psychosocial barriers to participate in program The patient should benefit from training in stress management and relaxation.      Screening Interventions   Interventions Encouraged to exercise;To provide support and resources with identified psychosocial needs;Provide feedback about the scores to participant    Expected Outcomes Long Term Goal: Stressors or current issues are controlled or eliminated.;Short Term goal: Identification and review with participant of any Quality of Life or Depression concerns found by scoring the questionnaire.;Long Term goal: The participant improves quality of Life and PHQ9 Scores as seen by post scores and/or verbalization of changes          Quality of Life Scores:   Quality of Life - 09/29/23 1248        Quality of Life   Select Quality of Life      Quality of Life Scores   Health/Function Pre 21.69 %    Socioeconomic Pre 23 %    Psych/Spiritual Pre 26.57 %    Family Pre 28.5 %    GLOBAL Pre 24 %         Scores of 19 and below usually indicate a poorer quality of life in these areas.  A difference of  2-3 points is a clinically meaningful difference.  A difference of 2-3 points in the total score of the Quality of Life Index has been associated with significant improvement in overall quality of life, self-image, physical symptoms, and general health in studies assessing change in quality of life.  PHQ-9: Review Flowsheet  More data exists      09/29/2023 09/19/2023 04/25/2023 04/14/2023 02/17/2023  Depression screen PHQ 2/9  Decreased Interest 0 0 0 0 0  Down, Depressed, Hopeless 0 0 0 0 0  PHQ - 2 Score 0 0 0 0 0  Altered sleeping 0 0 0 0 0  Tired, decreased energy 0 0 0 0 0  Change in appetite 0 0 0 0 0  Feeling bad or failure about yourself  0 0 0 0 0  Trouble concentrating 0 0 0 0 0  Moving slowly or fidgety/restless 0 0 0 0 0  Suicidal thoughts 0 0 0 0 0  PHQ-9 Score 0 0 0 0 0   Interpretation of Total Score  Total Score Depression Severity:  1-4 = Minimal depression, 5-9 = Mild depression, 10-14 = Moderate depression, 15-19 = Moderately severe depression, 20-27 = Severe depression   Psychosocial Evaluation and Intervention:   Psychosocial Re-Evaluation:  Psychosocial Re-Evaluation     Row Name  10/11/23 0808             Psychosocial Re-Evaluation   Current issues with Current Stress Concerns       Comments Nancyjo has not voiced any increased concerns or stressors since she has started exercise at cardiac rehab.       Expected Outcomes Vernadette will have controlled or decreased stressors  upon completion of cardiac rehab.       Interventions Stress management education;Relaxation education;Encouraged to attend Cardiac Rehabilitation for the exercise       Continue  Psychosocial Services  No Follow up required         Initial Review   Source of Stress Concerns Family       Comments Will continue to monitor and offer support as needed,          Psychosocial Discharge (Final Psychosocial Re-Evaluation):  Psychosocial Re-Evaluation - 10/11/23 0808       Psychosocial Re-Evaluation   Current issues with Current Stress Concerns    Comments Kimora has not voiced any increased concerns or stressors since she has started exercise at cardiac rehab.    Expected Outcomes Angelette will have controlled or decreased stressors  upon completion of cardiac rehab.    Interventions Stress management education;Relaxation education;Encouraged to attend Cardiac Rehabilitation for the exercise    Continue Psychosocial Services  No Follow up required      Initial Review   Source of Stress Concerns Family    Comments Will continue to monitor and offer support as needed,          Vocational Rehabilitation: Provide vocational rehab assistance to qualifying candidates.   Vocational Rehab Evaluation & Intervention:  Vocational Rehab - 09/29/23 1254       Initial Vocational Rehab Evaluation & Intervention   Assessment shows need for Vocational Rehabilitation No   Patient is retired         Education: Education Goals: Education classes will be provided on a variety of topics geared toward better understanding of heart health and risk factor modification. Participant will state understanding/return demonstration of topics presented as noted by education test scores.  Learning Barriers/Preferences:  Learning Barriers/Preferences - 09/29/23 1249       Learning Barriers/Preferences   Learning Barriers Exercise Concerns   Poor balance   Learning Preferences Group Instruction;Individual Instruction;Skilled Demonstration;Verbal Instruction          General Cardiac Education Topics:  AED/CPR: - Group verbal and written instruction with the use of models to  demonstrate the basic use of the AED with the basic ABC's of resuscitation.   Test and Procedures: - Group verbal and visual presentation and models provide information about basic cardiac anatomy and function. Reviews the testing methods done to diagnose heart disease and the outcomes of the test results. Describes the treatment choices: Medical Management, Angioplasty, or Coronary Bypass Surgery for treating various heart conditions including Myocardial Infarction, Angina, Valve Disease, and Cardiac Arrhythmias. Written material provided at class time.   Medication Safety: - Group verbal and visual instruction to review commonly prescribed medications for heart and lung disease. Reviews the medication, class of the drug, and side effects. Includes the steps to properly store meds and maintain the prescription regimen. Written material provided at class time.   Intimacy: - Group verbal instruction through game format to discuss how heart and lung disease can affect sexual intimacy. Written material provided at class time.   Know Your Numbers and Heart Failure: - Group verbal and visual instruction  to discuss disease risk factors for cardiac and pulmonary disease and treatment options.  Reviews associated critical values for Overweight/Obesity, Hypertension, Cholesterol, and Diabetes.  Discusses basics of heart failure: signs/symptoms and treatments.  Introduces Heart Failure Zone chart for action plan for heart failure. Written material provided at class time.   Infection Prevention: - Provides verbal and written material to individual with discussion of infection control including proper hand washing and proper equipment cleaning during exercise session.   Falls Prevention: - Provides verbal and written material to individual with discussion of falls prevention and safety.   Other: -Provides group and verbal instruction on various topics (see comments)   Knowledge Questionnaire  Score:  Knowledge Questionnaire Score - 09/29/23 1249       Knowledge Questionnaire Score   Pre Score 20/24          Core Components/Risk Factors/Patient Goals at Admission:  Personal Goals and Risk Factors at Admission - 09/29/23 1256       Core Components/Risk Factors/Patient Goals on Admission    Weight Management Yes;Weight Maintenance    Intervention Weight Management: Develop a combined nutrition and exercise program designed to reach desired caloric intake, while maintaining appropriate intake of nutrient and fiber, sodium and fats, and appropriate energy expenditure required for the weight goal.;Weight Management/Obesity: Establish reasonable short term and long term weight goals.;Weight Management: Provide education and appropriate resources to help participant work on and attain dietary goals.    Expected Outcomes Short Term: Continue to assess and modify interventions until short term weight is achieved;Long Term: Adherence to nutrition and physical activity/exercise program aimed toward attainment of established weight goal;Weight Maintenance: Understanding of the daily nutrition guidelines, which includes 25-35% calories from fat, 7% or less cal from saturated fats, less than 200mg  cholesterol, less than 1.5gm of sodium, & 5 or more servings of fruits and vegetables daily;Understanding recommendations for meals to include 15-35% energy as protein, 25-35% energy from fat, 35-60% energy from carbohydrates, less than 200mg  of dietary cholesterol, 20-35 gm of total fiber daily;Understanding of distribution of calorie intake throughout the day with the consumption of 4-5 meals/snacks    Hypertension Yes    Intervention Provide education on lifestyle modifcations including regular physical activity/exercise, weight management, moderate sodium restriction and increased consumption of fresh fruit, vegetables, and low fat dairy, alcohol moderation, and smoking cessation.;Monitor prescription  use compliance.    Expected Outcomes Short Term: Continued assessment and intervention until BP is < 140/39mm HG in hypertensive participants. < 130/10mm HG in hypertensive participants with diabetes, heart failure or chronic kidney disease.;Long Term: Maintenance of blood pressure at goal levels.    Lipids Yes    Intervention Provide education and support for participant on nutrition & aerobic/resistive exercise along with prescribed medications to achieve LDL 70mg , HDL >40mg .    Expected Outcomes Short Term: Participant states understanding of desired cholesterol values and is compliant with medications prescribed. Participant is following exercise prescription and nutrition guidelines.;Long Term: Cholesterol controlled with medications as prescribed, with individualized exercise RX and with personalized nutrition plan. Value goals: LDL < 70mg , HDL > 40 mg.    Stress Yes    Intervention Offer individual and/or small group education and counseling on adjustment to heart disease, stress management and health-related lifestyle change. Teach and support self-help strategies.;Refer participants experiencing significant psychosocial distress to appropriate mental health specialists for further evaluation and treatment. When possible, include family members and significant others in education/counseling sessions.    Expected Outcomes Short Term: Participant demonstrates changes  in health-related behavior, relaxation and other stress management skills, ability to obtain effective social support, and compliance with psychotropic medications if prescribed.;Long Term: Emotional wellbeing is indicated by absence of clinically significant psychosocial distress or social isolation.    Personal Goal Other Yes    Personal Goal ST: know limits (signs and symptoms). LT: Independance, back to ADL's    Intervention Will continue to monitor pt and progress workloads as tolerated without sign or symptom    Expected Outcomes  Pt will achieve her goals and increase strength          Education:Diabetes - Individual verbal and written instruction to review signs/symptoms of diabetes, desired ranges of glucose level fasting, after meals and with exercise. Acknowledge that pre and post exercise glucose checks will be done for 3 sessions at entry of program.   Core Components/Risk Factors/Patient Goals Review:   Goals and Risk Factor Review     Row Name 10/11/23 0811             Core Components/Risk Factors/Patient Goals Review   Personal Goals Review Weight Management/Obesity;Lipids;Hypertension;Stress       Review Aniyah started cardiac rehab on 10/05/23. Shanti is off to a good start to exercise. Vital signs have been stable.       Expected Outcomes Lashawnna will continue to participate in cardiac rehab for exercise, nutrition and lifestyle modifications.          Core Components/Risk Factors/Patient Goals at Discharge (Final Review):   Goals and Risk Factor Review - 10/11/23 0811       Core Components/Risk Factors/Patient Goals Review   Personal Goals Review Weight Management/Obesity;Lipids;Hypertension;Stress    Review Dasie started cardiac rehab on 10/05/23. Jacy is off to a good start to exercise. Vital signs have been stable.    Expected Outcomes Yousra will continue to participate in cardiac rehab for exercise, nutrition and lifestyle modifications.          ITP Comments:  ITP Comments     Row Name 09/29/23 9078 10/05/23 1134 10/11/23 0807       ITP Comments Wilbert Bihari, MD.  Introduction to the [facility] / Intensive Cardiac Rehab.  Initial orientation packet reviewed with the patient. 30-day ITP review. Jasmeet started cardiac rehab and was able to tolerated low inensity exercise fair for 21 minutes. WIll continue to progress as tolerated. 30-day ITP review. Renesmay started cardiac rehab on 10/05/23. Letisha is off to a fair start to exercise for her fitness level.         Comments: See ITP Comments

## 2023-10-12 ENCOUNTER — Other Ambulatory Visit: Payer: Self-pay | Admitting: Family Medicine

## 2023-10-12 ENCOUNTER — Encounter (HOSPITAL_COMMUNITY)
Admission: RE | Admit: 2023-10-12 | Discharge: 2023-10-12 | Disposition: A | Discharge status: Home / Self Care | Source: Ambulatory Visit | Attending: Cardiology | Admitting: Cardiology

## 2023-10-12 ENCOUNTER — Other Ambulatory Visit (HOSPITAL_COMMUNITY): Payer: Self-pay

## 2023-10-12 DIAGNOSIS — I214 Non-ST elevation (NSTEMI) myocardial infarction: Secondary | ICD-10-CM | POA: Diagnosis not present

## 2023-10-12 MED ORDER — PRAVASTATIN SODIUM 20 MG PO TABS
20.0000 mg | ORAL_TABLET | Freq: Every day | ORAL | 1 refills | Status: AC
  Filled 2023-10-12: qty 90, 90d supply, fill #0
  Filled 2024-01-18: qty 90, 90d supply, fill #1

## 2023-10-12 MED ORDER — DAPAGLIFLOZIN PROPANEDIOL 10 MG PO TABS
10.0000 mg | ORAL_TABLET | Freq: Every morning | ORAL | 1 refills | Status: AC
  Filled 2023-10-12 – 2023-10-14 (×2): qty 90, 90d supply, fill #0
  Filled 2024-01-18: qty 90, 90d supply, fill #1

## 2023-10-12 MED ORDER — EZETIMIBE 10 MG PO TABS
10.0000 mg | ORAL_TABLET | Freq: Every day | ORAL | 1 refills | Status: AC
  Filled 2023-10-12: qty 90, 90d supply, fill #0
  Filled 2024-01-03 (×2): qty 90, 90d supply, fill #1

## 2023-10-14 ENCOUNTER — Other Ambulatory Visit (HOSPITAL_COMMUNITY): Payer: Self-pay

## 2023-10-14 ENCOUNTER — Encounter (HOSPITAL_COMMUNITY)
Admission: RE | Admit: 2023-10-14 | Discharge: 2023-10-14 | Disposition: A | Discharge status: Home / Self Care | Source: Ambulatory Visit | Attending: Cardiology | Admitting: Cardiology

## 2023-10-14 DIAGNOSIS — I214 Non-ST elevation (NSTEMI) myocardial infarction: Secondary | ICD-10-CM | POA: Diagnosis not present

## 2023-10-14 MED ORDER — SPIRONOLACTONE 25 MG PO TABS
12.5000 mg | ORAL_TABLET | Freq: Every day | ORAL | 3 refills | Status: AC
  Filled 2023-10-14 – 2023-12-14 (×2): qty 45, 90d supply, fill #0

## 2023-10-17 ENCOUNTER — Other Ambulatory Visit (HOSPITAL_COMMUNITY): Payer: Self-pay

## 2023-10-17 ENCOUNTER — Encounter (HOSPITAL_COMMUNITY)
Admission: RE | Admit: 2023-10-17 | Discharge: 2023-10-17 | Disposition: A | Discharge status: Home / Self Care | Source: Ambulatory Visit | Attending: Cardiology | Admitting: Cardiology

## 2023-10-17 DIAGNOSIS — I214 Non-ST elevation (NSTEMI) myocardial infarction: Secondary | ICD-10-CM | POA: Diagnosis not present

## 2023-10-18 ENCOUNTER — Other Ambulatory Visit (HOSPITAL_COMMUNITY): Payer: Self-pay | Admitting: Cardiology

## 2023-10-18 DIAGNOSIS — I5022 Chronic systolic (congestive) heart failure: Secondary | ICD-10-CM

## 2023-10-19 ENCOUNTER — Encounter (HOSPITAL_COMMUNITY)

## 2023-10-21 ENCOUNTER — Telehealth: Payer: Self-pay

## 2023-10-21 ENCOUNTER — Encounter (HOSPITAL_COMMUNITY)
Admission: RE | Admit: 2023-10-21 | Discharge: 2023-10-21 | Disposition: A | Discharge status: Home / Self Care | Source: Ambulatory Visit | Attending: Cardiology | Admitting: Cardiology

## 2023-10-21 DIAGNOSIS — J439 Emphysema, unspecified: Secondary | ICD-10-CM

## 2023-10-21 DIAGNOSIS — I214 Non-ST elevation (NSTEMI) myocardial infarction: Secondary | ICD-10-CM

## 2023-10-21 MED ORDER — SPIRIVA RESPIMAT 2.5 MCG/ACT IN AERS: 2.0000 | INHALATION_SPRAY | Freq: Every day | RESPIRATORY_TRACT | 1 refills | Status: DC

## 2023-10-21 NOTE — Telephone Encounter (Signed)
 Patient is requesting Spiriva  medication from the pharmacy, EPIC is not letting me select the med due to it not being associated with a dx code. Please send Spiriva  to the pharmacy. Thanks!SABRA Cassell Mary CMA

## 2023-10-24 ENCOUNTER — Encounter (HOSPITAL_COMMUNITY)
Admission: RE | Admit: 2023-10-24 | Discharge: 2023-10-24 | Disposition: A | Discharge status: Home / Self Care | Source: Ambulatory Visit | Attending: Cardiology | Admitting: Cardiology

## 2023-10-24 DIAGNOSIS — I214 Non-ST elevation (NSTEMI) myocardial infarction: Secondary | ICD-10-CM | POA: Diagnosis not present

## 2023-10-26 ENCOUNTER — Encounter (HOSPITAL_COMMUNITY)
Admission: RE | Admit: 2023-10-26 | Discharge: 2023-10-26 | Disposition: A | Source: Ambulatory Visit | Attending: Cardiology

## 2023-10-26 DIAGNOSIS — I214 Non-ST elevation (NSTEMI) myocardial infarction: Secondary | ICD-10-CM | POA: Diagnosis not present

## 2023-10-28 ENCOUNTER — Encounter (HOSPITAL_COMMUNITY)
Admission: RE | Admit: 2023-10-28 | Discharge: 2023-10-28 | Disposition: A | Discharge status: Home / Self Care | Source: Ambulatory Visit | Attending: Cardiology | Admitting: Cardiology

## 2023-10-28 DIAGNOSIS — I214 Non-ST elevation (NSTEMI) myocardial infarction: Secondary | ICD-10-CM

## 2023-10-31 ENCOUNTER — Other Ambulatory Visit (HOSPITAL_COMMUNITY): Payer: Self-pay

## 2023-10-31 ENCOUNTER — Other Ambulatory Visit: Payer: Self-pay

## 2023-10-31 ENCOUNTER — Encounter (HOSPITAL_COMMUNITY)
Admission: RE | Admit: 2023-10-31 | Discharge: 2023-10-31 | Disposition: A | Discharge status: Home / Self Care | Source: Ambulatory Visit | Attending: Cardiology | Admitting: Cardiology

## 2023-10-31 ENCOUNTER — Other Ambulatory Visit: Payer: Self-pay | Admitting: Family Medicine

## 2023-10-31 DIAGNOSIS — I214 Non-ST elevation (NSTEMI) myocardial infarction: Secondary | ICD-10-CM

## 2023-10-31 MED ORDER — METOPROLOL SUCCINATE ER 25 MG PO TB24
12.5000 mg | ORAL_TABLET | Freq: Every day | ORAL | 0 refills | Status: DC
  Filled 2023-10-31: qty 30, 60d supply, fill #0

## 2023-10-31 MED ORDER — LOSARTAN POTASSIUM 25 MG PO TABS
12.5000 mg | ORAL_TABLET | Freq: Every day | ORAL | 0 refills | Status: DC
  Filled 2023-10-31: qty 30, 60d supply, fill #0

## 2023-11-02 ENCOUNTER — Encounter (HOSPITAL_COMMUNITY)
Admission: RE | Admit: 2023-11-02 | Discharge: 2023-11-02 | Disposition: A | Discharge status: Home / Self Care | Source: Ambulatory Visit | Attending: Cardiology | Admitting: Cardiology

## 2023-11-02 DIAGNOSIS — I214 Non-ST elevation (NSTEMI) myocardial infarction: Secondary | ICD-10-CM | POA: Diagnosis not present

## 2023-11-03 ENCOUNTER — Ambulatory Visit: Admitting: Family Medicine

## 2023-11-03 VITALS — BP 131/61 | HR 83 | Wt 139.0 lb

## 2023-11-03 DIAGNOSIS — L989 Disorder of the skin and subcutaneous tissue, unspecified: Secondary | ICD-10-CM | POA: Diagnosis not present

## 2023-11-03 DIAGNOSIS — L821 Other seborrheic keratosis: Secondary | ICD-10-CM | POA: Diagnosis not present

## 2023-11-03 NOTE — Patient Instructions (Signed)
 It was great to see you today!  Today we talked about the moles on your back. They are something called seborrheic keratosis, which are benign growths with no potential to become cancer. They do not require treatment and are only removed if the patient requests for cosmetic reasons. There is only one mole we think requires monitoring, which is the darker spot on your lower back on the left. It is likely something called a benign melanotic nevus, but given that it looks different from your other moles, we'll see you back at 1:30pm on 1/15 to monitor it.   Thank you for choosing [facility].   Please call [phone] with any questions about today's appointment.  Leafy Scriver, DO Family Medicine

## 2023-11-03 NOTE — Assessment & Plan Note (Signed)
 Multiple medium to large seborrheic keratoses.  Low concern for leser trelat sign. No intervention needed. Single small melanotic macule on her L lower back warranting monitoring, low concern for melanoma at this time, likely benign melanotic nevus.  RTC in 3 months.

## 2023-11-03 NOTE — Progress Notes (Signed)
    SUBJECTIVE:   CHIEF COMPLAINT / HPI:   Patient presents for check up of moles she has had on her back for many years. She is asymptomatic and denies pain, itching or recent change in size. Notes that rarely one of her moles with bleed and flake for a brief time.  She denies any cosmetic concerns, is not pursued in pursuing removal, just wants to make sure that they are not a problem.   OBJECTIVE:   BP 131/61   Pulse 83   Wt 139 lb (63 kg)   SpO2 97%   BMI 23.67 kg/m   General: Awake, alert, NAD. Communicates clearly. Skin: About 5 small to medium and 5-6 large waxy, stuck on seborrheic keratoses across the upper and mid back w/o erythema or bleeding. Single 1mm melanotic macule on L lower back / flank w/ uniform color. No irregular borders.   ASSESSMENT/PLAN:   Assessment & Plan Seborrheic keratosis Multiple medium to large seborrheic keratoses.  Low concern for leser trelat sign. No intervention needed. Single small melanotic macule on her L lower back warranting monitoring, low concern for melanoma at this time, likely benign melanotic nevus.  RTC in 3 months.    Kelly Scriver, DO [facility]

## 2023-11-04 ENCOUNTER — Encounter (HOSPITAL_COMMUNITY)
Admission: RE | Admit: 2023-11-04 | Discharge: 2023-11-04 | Disposition: A | Discharge status: Home / Self Care | Source: Ambulatory Visit | Attending: Cardiology | Admitting: Cardiology

## 2023-11-04 DIAGNOSIS — I214 Non-ST elevation (NSTEMI) myocardial infarction: Secondary | ICD-10-CM | POA: Diagnosis not present

## 2023-11-04 NOTE — Progress Notes (Signed)
 Reviewed home exercise guidelines with Kelly Snyder including endpoints, temperature precautions, target heart rate and rate of perceived exertion. She plans to exercise at [facility] 1-2 days/week as her mode of home exercise. Kelly Snyder voices understanding of instructions given.  Arnoldo CHRISTELLA Gal, MS, ACSM CEP

## 2023-11-07 ENCOUNTER — Encounter (HOSPITAL_COMMUNITY)
Admission: RE | Admit: 2023-11-07 | Discharge: 2023-11-07 | Disposition: A | Discharge status: Home / Self Care | Source: Ambulatory Visit | Attending: Cardiology | Admitting: Cardiology

## 2023-11-07 DIAGNOSIS — I214 Non-ST elevation (NSTEMI) myocardial infarction: Secondary | ICD-10-CM | POA: Insufficient documentation

## 2023-11-08 NOTE — Progress Notes (Signed)
 Cardiac Individual Treatment Plan  Patient Details  Name: Kelly Snyder MRN: [id number] Date of Birth: 1945-07-24 Referring Provider:   Flowsheet Row INTENSIVE CARDIAC REHAB ORIENT from 09/29/2023 in [facility]  Referring Provider Dr. Newman Lawrence, MD    Initial Encounter Date:  Flowsheet Row INTENSIVE CARDIAC REHAB ORIENT from 09/29/2023 in [facility] for Heart, Vascular, & Lung Health  Date 09/29/23    Visit Diagnosis: 09/12/23 NSTEMI (non-ST elevated myocardial infarction) [facility])  Patient's Home Medications on Admission:  Current Outpatient Medications:    acetaminophen  (TYLENOL ) 500 MG tablet, Take 1,000 mg by mouth every 6 (six) hours as needed for moderate pain (pain). , Disp: , Rfl:    albuterol  (VENTOLIN  HFA) 108 (90 Base) MCG/ACT inhaler, Inhale 2 puffs into the lungs every 6 (six) hours as needed for wheezing or shortness of breath., Disp: , Rfl:    aspirin  81 MG chewable tablet, Chew 1 tablet (81 mg total) by mouth daily., Disp: , Rfl:    Calcium  Carb-Cholecalciferol (CALCIUM  600-D PO), Take 1 tablet by mouth daily., Disp: , Rfl:    cephALEXin  (KEFLEX ) 500 MG capsule, Take 1 capsule (500 mg total) by mouth 4 (four) times daily. (Patient not taking: Reported on 09/29/2023), Disp: 28 capsule, Rfl: 0   dapagliflozin  propanediol (FARXIGA ) 10 MG TABS tablet, Take 1 tablet (10 mg total) by mouth before breakfast, Disp: 90 tablet, Rfl: 1   ezetimibe  (ZETIA ) 10 MG tablet, Take 1 tablet (10 mg total) by mouth daily., Disp: 90 tablet, Rfl: 1   Lidocaine  (ASPERCREME MAX STRENGTH) 4 % AERO, Apply 1 Application topically daily as needed (hip arthritis pain)., Disp: , Rfl:    losartan  (COZAAR ) 25 MG tablet, Take 0.5 tablets (12.5 mg total) by mouth daily., Disp: 30 tablet, Rfl: 0   metoprolol  succinate (TOPROL -XL) 25 MG 24 hr tablet, Take 0.5 tablets (12.5 mg total) by mouth daily., Disp: 30 tablet, Rfl: 0    Multiple Vitamin (MULTIVITAMIN WITH MINERALS) TABS tablet, Take 1 tablet by mouth daily., Disp: , Rfl:    nitroGLYCERIN  (NITROSTAT ) 0.4 MG SL tablet, Place 1 tablet (0.4 mg total) under the tongue every 5 (five) minutes as needed for chest pain., Disp: 30 tablet, Rfl: 0   pravastatin  (PRAVACHOL ) 20 MG tablet, Take 1 tablet (20 mg total) by mouth daily at 6 PM., Disp: 90 tablet, Rfl: 1   spironolactone  (ALDACTONE ) 25 MG tablet, Take 0.5 tablets (12.5 mg total) by mouth daily., Disp: 45 tablet, Rfl: 3   spironolactone  (ALDACTONE ) 25 MG tablet, Take 0.5 tablets (12.5 mg total) by mouth daily., Disp: 45 tablet, Rfl: 3   Tiotropium Bromide  (SPIRIVA  RESPIMAT) 2.5 MCG/ACT AERS, Inhale 2 puffs into the lungs daily., Disp: 4 g, Rfl: 1  Past Medical History: Past Medical History:  Diagnosis Date   Arthritis    COPD (chronic obstructive pulmonary disease) (HCC)    Pneumonia    Rash 02/17/2023    Tobacco Use: Social History   Tobacco Use  Smoking Status Former   Current packs/day: 1.00   Average packs/day: 1 pack/day for 57.8 years (57.8 ttl pk-yrs)   Types: Cigarettes   Start date: 01/04/1966   Passive exposure: Past  Smokeless Tobacco Never  Tobacco Comments   quit 10/28/2018    Labs: Review Flowsheet  More data exists      Latest Ref Rng & Units 02/04/2021 12/28/2021 01/15/2023 09/13/2023 09/16/2023  Labs for ITP Cardiac and Pulmonary Rehab  Cholestrol 0 - 200 mg/dL - - 801  793  -  LDL (calc) 0 - 99 mg/dL - - 882  884  -  Direct LDL 0 - 99 mg/dL 863  - - - -  HDL-C >59 mg/dL - - 74  84  -  Trlycerides <150 mg/dL - - 37  34  -  Hemoglobin A1c 4.8 - 5.6 % - - 5.2  - -  PH, Arterial 7.35 - 7.45 - - - 7.395  -  PCO2 arterial 32 - 48 mmHg - - - 43.5  -  Bicarbonate 20.0 - 28.0 mmol/L - - - 27.4  26.6  -  TCO2 22 - 32 mmol/L - 29  - 29  28  29    O2 Saturation % - - 98.2  54  87  -    Details       Multiple values from one day are sorted in reverse-chronological order          Capillary Blood Glucose: No results found for: GLUCAP   Exercise Target Goals: Exercise Program Goal: Individual exercise prescription set using results from initial 6 min walk test and THRR while considering  patient's activity barriers and safety.   Exercise Prescription Goal: Initial exercise prescription builds to 30-45 minutes a day of aerobic activity, 2-3 days per week.  Home exercise guidelines will be given to patient during program as part of exercise prescription that the participant will acknowledge.  Activity Barriers & Risk Stratification:  Activity Barriers & Cardiac Risk Stratification - 09/29/23 1243       Activity Barriers & Cardiac Risk Stratification   Activity Barriers Joint Problems;Deconditioning;Arthritis;Balance Concerns    Cardiac Risk Stratification High          6 Minute Walk:  6 Minute Walk     Row Name 09/29/23 1232         6 Minute Walk   Phase Initial     Distance 720 feet     Walk Time 6 minutes     # of Rest Breaks 0     MPH 1.36     METS 1.52     RPE 11     Perceived Dyspnea  0     VO2 Peak 5.33     Symptoms No     Resting HR 74 bpm     Resting BP 112/58     Resting Oxygen Saturation  94 %     Exercise Oxygen Saturation  during 6 min walk 98 %     Max Ex. HR 90 bpm     Max Ex. BP 128/64     2 Minute Post BP 114/60        Oxygen Initial Assessment:   Oxygen Re-Evaluation:   Oxygen Discharge (Final Oxygen Re-Evaluation):   Initial Exercise Prescription:  Initial Exercise Prescription - 09/29/23 1200       Date of Initial Exercise RX and Referring Provider   Date 09/29/23    Referring Provider Dr. Newman Lawrence, MD    Expected Discharge Date 12/23/23      NuStep   Level 1    SPM 65    Minutes 25    METs 1.5      Prescription Details   Frequency (times per week) 3    Duration Progress to 30 minutes of continuous aerobic without signs/symptoms of physical distress      Intensity   THRR 40-80% of  Max Heartrate 57-114  Ratings of Perceived Exertion 11-13    Perceived Dyspnea 0-4      Progression   Progression Continue progressive overload as per policy without signs/symptoms or physical distress.      Resistance Training   Training Prescription Yes    Weight 1    Reps 10-15          Perform Capillary Blood Glucose checks as needed.  Exercise Prescription Changes:   Exercise Prescription Changes     Row Name 10/05/23 1033 10/10/23 1027 10/24/23 1026 11/07/23 1030       Response to Exercise   Blood Pressure (Admit) 138/66 120/60 122/66 102/72    Blood Pressure (Exercise) 130/60 124/60 118/62 126/56    Blood Pressure (Exit) 118/60 98/64 108/68 92/56    Heart Rate (Admit) 69 bpm 72 bpm 72 bpm 74 bpm    Heart Rate (Exercise) 85 bpm 94 bpm 92 bpm 95 bpm    Heart Rate (Exit) 71 bpm 78 bpm 79 bpm 77 bpm    Rating of Perceived Exertion (Exercise) 13 12 12  11.5    Symptoms None None None None    Comments Off to a fair start with exercise. -- -- Increased workload on NuStep    Duration Progress to 30 minutes of  aerobic without signs/symptoms of physical distress Progress to 30 minutes of  aerobic without signs/symptoms of physical distress Progress to 30 minutes of  aerobic without signs/symptoms of physical distress Progress to 30 minutes of  aerobic without signs/symptoms of physical distress    Intensity THRR unchanged THRR unchanged THRR unchanged THRR unchanged      Progression   Progression Continue to progress workloads to maintain intensity without signs/symptoms of physical distress. Continue to progress workloads to maintain intensity without signs/symptoms of physical distress. Continue to progress workloads to maintain intensity without signs/symptoms of physical distress. Continue to progress workloads to maintain intensity without signs/symptoms of physical distress.    Average METs 1.3 2 2.3 2.1      Resistance Training   Training Prescription No Yes Yes Yes     Weight relaxation day. No weights. 1 lb wts 1 lbs 1 lbs    Reps -- 10-15 10-15 10-15    Time -- 5 Minutes 5 Minutes 5 Minutes      Interval Training   Interval Training No No No No      NuStep   Level 1 1 1 2     SPM 37 66 75 76    Minutes 21 25 25 30     METs 1.3 2 2.3 2.1      Home Exercise Plan   Plans to continue exercise at -- -- -- [employer] (comment)  Exercise at [employer] -- -- -- Add 1 additional day to program exercise sessions.    Initial Home Exercises Provided -- -- -- 11/04/23       Exercise Comments:   Exercise Comments     Row Name 10/05/23 1134 10/31/23 1057 11/04/23 1047       Exercise Comments Desira tolerated low intensity exercise for 21 minutes fair without symptoms. She was oreinted to the execise equipment and stretching routine. Reviewed goals with Reena. Reviewed home exercise guidelines with Reena.        Exercise Goals and Review:   Exercise Goals     Row Name 09/29/23 [phone]             Exercise Goals   Increase Physical Activity Yes  Intervention Provide advice, education, support and counseling about physical activity/exercise needs.;Develop an individualized exercise prescription for aerobic and resistive training based on initial evaluation findings, risk stratification, comorbidities and participant's personal goals.       Expected Outcomes Short Term: Attend rehab on a regular basis to increase amount of physical activity.;Long Term: Add in home exercise to make exercise part of routine and to increase amount of physical activity.;Long Term: Exercising regularly at least 3-5 days a week.       Increase Strength and Stamina Yes       Intervention Provide advice, education, support and counseling about physical activity/exercise needs.;Develop an individualized exercise prescription for aerobic and resistive training based on initial evaluation findings, risk stratification, comorbidities and participant's  personal goals.       Expected Outcomes Short Term: Increase workloads from initial exercise prescription for resistance, speed, and METs.;Short Term: Perform resistance training exercises routinely during rehab and add in resistance training at home;Long Term: Improve cardiorespiratory fitness, muscular endurance and strength as measured by increased METs and functional capacity ( )       Able to understand and use rate of perceived exertion (RPE) scale Yes       Intervention Provide education and explanation on how to use RPE scale       Expected Outcomes Short Term: Able to use RPE daily in rehab to express subjective intensity level;Long Term:  Able to use RPE to guide intensity level when exercising independently       Knowledge and understanding of Target Heart Rate Range (THRR) Yes       Intervention Provide education and explanation of THRR including how the numbers were predicted and where they are located for reference       Expected Outcomes Short Term: Able to state/look up THRR;Short Term: Able to use daily as guideline for intensity in rehab;Long Term: Able to use THRR to govern intensity when exercising independently       Understanding of Exercise Prescription Yes       Intervention Provide education, explanation, and written materials on patient's individual exercise prescription       Expected Outcomes Short Term: Able to explain program exercise prescription;Long Term: Able to explain home exercise prescription to exercise independently          Exercise Goals Re-Evaluation :  Exercise Goals Re-Evaluation     Row Name 10/05/23 1134 10/31/23 1057 11/04/23 1047         Exercise Goal Re-Evaluation   Exercise Goals Review Increase Physical Activity;Increase Strength and Stamina;Able to understand and use rate of perceived exertion (RPE) scale Increase Physical Activity;Increase Strength and Stamina;Able to understand and use rate of perceived exertion (RPE) scale Increase  Physical Activity;Increase Strength and Stamina;Able to understand and use rate of perceived exertion (RPE) scale;Understanding of Exercise Prescription     Comments Sheilla was able to understand and use RPE scale appropriately. [facility] has a gym/ fitness center, and she will look into using the facility for her exercise. Her goal is to gain assurance that she won't have another heart attack. Reviewed exercise prescription with Reena. She will exercise at her church gym 1-2 days/week.     Expected Outcomes Increase duration from 21 minutes to 30 minutes. Progress workloads as tolerated. Roshaunda will continue exercise and lifestyle changes to reduce cardiac risk factors. Nalayah will exercise at gym 1-2 days/week in addition to exercise at cardiac rehab.        Discharge Exercise Prescription (Final  Exercise Prescription Changes):  Exercise Prescription Changes - 11/07/23 1030       Response to Exercise   Blood Pressure (Admit) 102/72    Blood Pressure (Exercise) 126/56    Blood Pressure (Exit) 92/56    Heart Rate (Admit) 74 bpm    Heart Rate (Exercise) 95 bpm    Heart Rate (Exit) 77 bpm    Rating of Perceived Exertion (Exercise) 11.5    Symptoms None    Comments Increased workload on NuStep    Duration Progress to 30 minutes of  aerobic without signs/symptoms of physical distress    Intensity THRR unchanged      Progression   Progression Continue to progress workloads to maintain intensity without signs/symptoms of physical distress.    Average METs 2.1      Resistance Training   Training Prescription Yes    Weight 1 lbs    Reps 10-15    Time 5 Minutes      Interval Training   Interval Training No      NuStep   Level 2    SPM 76    Minutes 30    METs 2.1      Home Exercise Plan   Plans to continue exercise at [employer] (comment)   Exercise at church gym   Frequency Add 1 additional day to program exercise sessions.    Initial Home Exercises Provided  11/04/23          Nutrition:  Target Goals: Understanding of nutrition guidelines, daily intake of sodium 1500mg , cholesterol 200mg , calories 30% from fat and 7% or less from saturated fats, daily to have 5 or more servings of fruits and vegetables.  Biometrics:  Pre Biometrics - 09/29/23 0921       Pre Biometrics   Waist Circumference 32 inches    Hip Circumference 41.5 inches    Waist to Hip Ratio 0.77 %    Triceps Skinfold 15 mm    % Body Fat 33.4 %    Grip Strength 10 kg    Flexibility 11.5 in    Single Leg Stand 3 seconds           Nutrition Therapy Plan and Nutrition Goals:   Nutrition Assessments:  MEDIFICTS Score Key: >=70 Need to make dietary changes  40-70 Heart Healthy Diet <= 40 Therapeutic Level Cholesterol Diet   Flowsheet Row INTENSIVE CARDIAC REHAB from 10/05/2023 in [facility] V[facility], [facility] Lung [facility]  Picture Your Plate Total Score on Admission 77   Picture Your Plate Scores: <59 Unhealthy dietary pattern with much room for improvement. 41-50 Dietary pattern unlikely to meet recommendations for good health and room for improvement. 51-60 More healthful dietary pattern, with some room for improvement.  >60 Healthy dietary pattern, although there may be some specific behaviors that could be improved.    Nutrition Goals Re-Evaluation:   Nutrition Goals Re-Evaluation:   Nutrition Goals Discharge (Final Nutrition Goals Re-Evaluation):   Psychosocial: Target Goals: Acknowledge presence or absence of significant depression and/or stress, maximize coping skills, provide positive support system. Participant is able to verbalize types and ability to use techniques and skills needed for reducing stress and depression.  Initial Review & Psychosocial Screening:  Initial Psych Review & Screening - 09/29/23 1247       Initial Review   Current issues with Current Stress Concerns    Source of Stress Concerns Family     Comments Some mild stress with the well  being of her family. Overal she feels good and has a good support group with her church family, son and his girlfriend. No needs at this time      Family Dynamics   Good Support System? Yes      Barriers   Psychosocial barriers to participate in program The patient should benefit from training in stress management and relaxation.      Screening Interventions   Interventions Encouraged to exercise;To provide support and resources with identified psychosocial needs;Provide feedback about the scores to participant    Expected Outcomes Long Term Goal: Stressors or current issues are controlled or eliminated.;Short Term goal: Identification and review with participant of any Quality of Life or Depression concerns found by scoring the questionnaire.;Long Term goal: The participant improves quality of Life and PHQ9 Scores as seen by post scores and/or verbalization of changes          Quality of Life Scores:  Quality of Life - 09/29/23 1248       Quality of Life   Select Quality of Life      Quality of Life Scores   Health/Function Pre 21.69 %    Socioeconomic Pre 23 %    Psych/Spiritual Pre 26.57 %    Family Pre 28.5 %    GLOBAL Pre 24 %         Scores of 19 and below usually indicate a poorer quality of life in these areas.  A difference of  2-3 points is a clinically meaningful difference.  A difference of 2-3 points in the total score of the Quality of Life Index has been associated with significant improvement in overall quality of life, self-image, physical symptoms, and general health in studies assessing change in quality of life.  PHQ-9: Review Flowsheet  More data exists      09/29/2023 09/19/2023 04/25/2023 04/14/2023 02/17/2023  Depression screen PHQ 2/9  Decreased Interest 0 0 0 0 0  Down, Depressed, Hopeless 0 0 0 0 0  PHQ - 2 Score 0 0 0 0 0  Altered sleeping 0 0 0 0 0  Tired, decreased energy 0 0 0 0 0  Change in appetite 0  0 0 0 0  Feeling bad or failure about yourself  0 0 0 0 0  Trouble concentrating 0 0 0 0 0  Moving slowly or fidgety/restless 0 0 0 0 0  Suicidal thoughts 0 0 0 0 0  PHQ-9 Score 0 0 0 0 0   Interpretation of Total Score  Total Score Depression Severity:  1-4 = Minimal depression, 5-9 = Mild depression, 10-14 = Moderate depression, 15-19 = Moderately severe depression, 20-27 = Severe depression   Psychosocial Evaluation and Intervention:   Psychosocial Re-Evaluation:  Psychosocial Re-Evaluation     Row Name 10/11/23 0808 11/07/23 0756           Psychosocial Re-Evaluation   Current issues with Current Stress Concerns Current Stress Concerns      Comments Kensli has not voiced any increased concerns or stressors since she has started exercise at cardiac rehab. Darrell continues not voiced to voice  increased concerns or stressors since she during exercise at cardiac rehab.      Expected Outcomes Haruko will have controlled or decreased stressors  upon completion of cardiac rehab. Shariah will have controlled or decreased stressors  upon completion of cardiac rehab.      Interventions Stress management education;Relaxation education;Encouraged to attend Cardiac Rehabilitation for the exercise Stress management education;Relaxation education;Encouraged  to attend Cardiac Rehabilitation for the exercise      Continue Psychosocial Services  No Follow up required No Follow up required        Initial Review   Source of Stress Concerns Family Family      Comments Will continue to monitor and offer support as needed, Will continue to monitor and offer support as needed,         Psychosocial Discharge (Final Psychosocial Re-Evaluation):  Psychosocial Re-Evaluation - 11/07/23 0756       Psychosocial Re-Evaluation   Current issues with Current Stress Concerns    Comments Raylen continues not voiced to voice  increased concerns or stressors since she during exercise at cardiac rehab.     Expected Outcomes Danee will have controlled or decreased stressors  upon completion of cardiac rehab.    Interventions Stress management education;Relaxation education;Encouraged to attend Cardiac Rehabilitation for the exercise    Continue Psychosocial Services  No Follow up required      Initial Review   Source of Stress Concerns Family    Comments Will continue to monitor and offer support as needed,          Vocational Rehabilitation: Provide vocational rehab assistance to qualifying candidates.   Vocational Rehab Evaluation & Intervention:  Vocational Rehab - 09/29/23 1254       Initial Vocational Rehab Evaluation & Intervention   Assessment shows need for Vocational Rehabilitation No   Patient is retired         Education: Education Goals: Education classes will be provided on a weekly basis, covering required topics. Participant will state understanding/return demonstration of topics presented.    Education     Row Name 10/05/23 1000     Education   Cardiac Education Topics Pritikin   Customer Service Manager   Weekly Topic Simple Sides and Sauces   Instruction Review Code 1- Verbalizes Understanding   Class Start Time 1155   Class Stop Time 1230   Class Time Calculation (min) 35 min    Row Name 10/07/23 1100     Education   Cardiac Education Topics Pritikin   Nurse, Children's Exercise Physiologist   Select Psychosocial   Psychosocial How Our Thoughts Can Heal Our Hearts   Instruction Review Code 1- Verbalizes Understanding   Class Start Time 1200   Class Stop Time 1240   Class Time Calculation (min) 40 min    Row Name 10/10/23 1100     Education   Cardiac Education Topics Pritikin   Hospital Doctor Education   General Education Heart Disease Risk Reduction   Instruction Review Code 1- Verbalizes  Understanding   Class Start Time 1150   Class Stop Time 1228   Class Time Calculation (min) 38 min    Row Name 10/12/23 1100     Education   Cardiac Education Topics Pritikin   Orthoptist   Educator Dietitian   Weekly Topic Powerhouse Plant-Based Proteins   Instruction Review Code 1- Verbalizes Understanding   Class Start Time 1145   Class Stop Time 1216   Class Time Calculation (min) 31 min    Row Name 10/14/23 1300     Education   Cardiac Education Topics Pritikin   Psychologist, Sport And Exercise  Core Videos   Educator Dietitian   Select Nutrition   Nutrition Facts on Fat   Instruction Review Code 1- Verbalizes Understanding   Class Start Time 1144   Class Stop Time 1217   Class Time Calculation (min) 33 min    Row Name 10/17/23 1100     Education   Cardiac Education Topics Pritikin   Geographical Information Systems Officer Psychosocial   Psychosocial Workshop From Head to Heart: The Power of a Healthy Outlook   Instruction Review Code 1- Verbalizes Understanding   Class Start Time 1150   Class Stop Time 1230   Class Time Calculation (min) 40 min    Row Name 10/21/23 1300     Education   Cardiac Education Topics Pritikin   Select Workshops     Workshops   Educator Exercise Physiologist   Select Exercise   Exercise Workshop Managing Heart Disease: Your Path to a Healthier Heart   Instruction Review Code 1- Verbalizes Understanding   Class Start Time 1152   Class Stop Time 1233   Class Time Calculation (min) 41 min    Row Name 10/24/23 1100     Education   Cardiac Education Topics Pritikin   Nurse, Children's Exercise Physiologist   Select Psychosocial   Psychosocial Healthy Minds, Bodies, Hearts   Instruction Review Code 1- Verbalizes Understanding   Class Start Time 1145   Class Stop Time 1220   Class Time Calculation (min) 35 min    Row Name 10/26/23  1100     Education   Cardiac Education Topics Pritikin   Customer Service Manager   Weekly Topic Adding Flavor - Sodium-Free   Instruction Review Code 1- Verbalizes Understanding   Class Start Time 1145   Class Stop Time 1223   Class Time Calculation (min) 38 min    Row Name 10/28/23 1000     Education   Cardiac Education Topics Pritikin   Glass Blower/designer Nutrition   Nutrition Workshop Label Reading   Instruction Review Code 1- Verbalizes Understanding   Class Start Time 1145   Class Stop Time 1220   Class Time Calculation (min) 35 min    Row Name 10/31/23 1100     Education   Cardiac Education Topics Pritikin   Select Workshops     Workshops   Educator Exercise Physiologist   Select Exercise   Exercise Workshop Location Manager and Fall Prevention   Instruction Review Code 1- Verbalizes Understanding   Class Start Time 1154   Class Stop Time 1243   Class Time Calculation (min) 49 min    Row Name 11/02/23 1100     Education   Cardiac Education Topics Pritikin   Set Designer Nurse;Respiratory Therapist   Weekly Topic Fast and Healthy Breakfasts   Instruction Review Code 1- Verbalizes Understanding   Class Start Time 1145   Class Stop Time 1225   Class Time Calculation (min) 40 min    Row Name 11/04/23 1000     Education   Cardiac Education Topics Pritikin   Licensed Conveyancer Nutrition   Nutrition Other  Label reading  Instruction Review Code 1- Verbalizes Understanding   Class Start Time 1145   Class Stop Time 1230   Class Time Calculation (min) 45 min    Row Name 11/07/23 1100     Education   Cardiac Education Topics Pritikin   Hospital Doctor Education   General Education Metabolic Syndrome and Belly  Fat   Instruction Review Code 1- Verbalizes Understanding      Core Videos: Exercise    Move It!  Clinical staff conducted group or individual video education with verbal and written material and guidebook.  Patient learns the recommended Pritikin exercise program. Exercise with the goal of living a long, healthy life. Some of the health benefits of exercise include controlled diabetes, healthier blood pressure levels, improved cholesterol levels, improved heart and lung capacity, improved sleep, and better body composition. Everyone should speak with their doctor before starting or changing an exercise routine.  Biomechanical Limitations Clinical staff conducted group or individual video education with verbal and written material and guidebook.  Patient learns how biomechanical limitations can impact exercise and how we can mitigate and possibly overcome limitations to have an impactful and balanced exercise routine.  Body Composition Clinical staff conducted group or individual video education with verbal and written material and guidebook.  Patient learns that body composition (ratio of muscle mass to fat mass) is a key component to assessing overall fitness, rather than body weight alone. Increased fat mass, especially visceral belly fat, can put us  at increased risk for metabolic syndrome, type 2 diabetes, heart disease, and even death. It is recommended to combine diet and exercise (cardiovascular and resistance training) to improve your body composition. Seek guidance from your physician and exercise physiologist before implementing an exercise routine.  Exercise Action Plan Clinical staff conducted group or individual video education with verbal and written material and guidebook.  Patient learns the recommended strategies to achieve and enjoy long-term exercise adherence, including variety, self-motivation, self-efficacy, and positive decision making. Benefits of exercise include  fitness, good health, weight management, more energy, better sleep, less stress, and overall well-being.  Medical   Heart Disease Risk Reduction Clinical staff conducted group or individual video education with verbal and written material and guidebook.  Patient learns our heart is our most vital organ as it circulates oxygen, nutrients, white blood cells, and hormones throughout the entire body, and carries waste away. Data supports a plant-based eating plan like the Pritikin Program for its effectiveness in slowing progression of and reversing heart disease. The video provides a number of recommendations to address heart disease.   Metabolic Syndrome and Belly Fat  Clinical staff conducted group or individual video education with verbal and written material and guidebook.  Patient learns what metabolic syndrome is, how it leads to heart disease, and how one can reverse it and keep it from coming back. You have metabolic syndrome if you have 3 of the following 5 criteria: abdominal obesity, high blood pressure, high triglycerides, low HDL cholesterol, and high blood sugar.  Hypertension and Heart Disease Clinical staff conducted group or individual video education with verbal and written material and guidebook.  Patient learns that high blood pressure, or hypertension, is very common in the [place] . Hypertension is largely due to excessive salt intake, but other important risk factors include being overweight, physical inactivity, drinking too much alcohol, smoking, and not eating enough potassium from fruits and vegetables. High blood  pressure is a leading risk factor for heart attack, stroke, congestive heart failure, dementia, kidney failure, and premature death. Long-term effects of excessive salt intake include stiffening of the arteries and thickening of heart muscle and organ damage. Recommendations include ways to reduce hypertension and the risk of heart disease.  Diseases of Our Time  - Focusing on Diabetes Clinical staff conducted group or individual video education with verbal and written material and guidebook.  Patient learns why the best way to stop diseases of our time is prevention, through food and other lifestyle changes. Medicine (such as prescription pills and surgeries) is often only a Band-Aid on the problem, not a long-term solution. Most common diseases of our time include obesity, type 2 diabetes, hypertension, heart disease, and cancer. The Pritikin Program is recommended and has been proven to help reduce, reverse, and/or prevent the damaging effects of metabolic syndrome.  Nutrition   Overview of the Pritikin Eating Plan  Clinical staff conducted group or individual video education with verbal and written material and guidebook.  Patient learns about the Pritikin Eating Plan for disease risk reduction. The Pritikin Eating Plan emphasizes a wide variety of unrefined, minimally-processed carbohydrates, like fruits, vegetables, whole grains, and legumes. Go, Caution, and Stop food choices are explained. Plant-based and lean animal proteins are emphasized. Rationale provided for low sodium intake for blood pressure control, low added sugars for blood sugar stabilization, and low added fats and oils for coronary artery disease risk reduction and weight management.  Calorie Density  Clinical staff conducted group or individual video education with verbal and written material and guidebook.  Patient learns about calorie density and how it impacts the Pritikin Eating Plan. Knowing the characteristics of the food you choose will help you decide whether those foods will lead to weight gain or weight loss, and whether you want to consume more or less of them. Weight loss is usually a side effect of the Pritikin Eating Plan because of its focus on low calorie-dense foods.  Label Reading  Clinical staff conducted group or individual video education with verbal and written  material and guidebook.  Patient learns about the Pritikin recommended label reading guidelines and corresponding recommendations regarding calorie density, added sugars, sodium content, and whole grains.  Dining Out - Part 1  Clinical staff conducted group or individual video education with verbal and written material and guidebook.  Patient learns that restaurant meals can be sabotaging because they can be so high in calories, fat, sodium, and/or sugar. Patient learns recommended strategies on how to positively address this and avoid unhealthy pitfalls.  Facts on Fats  Clinical staff conducted group or individual video education with verbal and written material and guidebook.  Patient learns that lifestyle modifications can be just as effective, if not more so, as many medications for lowering your risk of heart disease. A Pritikin lifestyle can help to reduce your risk of inflammation and atherosclerosis (cholesterol build-up, or plaque, in the artery walls). Lifestyle interventions such as dietary choices and physical activity address the cause of atherosclerosis. A review of the types of fats and their impact on blood cholesterol levels, along with dietary recommendations to reduce fat intake is also included.  Nutrition Action Plan  Clinical staff conducted group or individual video education with verbal and written material and guidebook.  Patient learns how to incorporate Pritikin recommendations into their lifestyle. Recommendations include planning and keeping personal health goals in mind as an important part of their success.  Healthy Mind-Set  Healthy Minds, Bodies, Hearts  Clinical staff conducted group or individual video education with verbal and written material and guidebook.  Patient learns how to identify when they are stressed. Video will discuss the impact of that stress, as well as the many benefits of stress management. Patient will also be introduced to stress management  techniques. The way we think, act, and feel has an impact on our hearts.  How Our Thoughts Can Heal Our Hearts  Clinical staff conducted group or individual video education with verbal and written material and guidebook.  Patient learns that negative thoughts can cause depression and anxiety. This can result in negative lifestyle behavior and serious health problems. Cognitive behavioral therapy is an effective method to help control our thoughts in order to change and improve our emotional outlook.  Additional Videos:  Exercise    Improving Performance  Clinical staff conducted group or individual video education with verbal and written material and guidebook.  Patient learns to use a non-linear approach by alternating intensity levels and lengths of time spent exercising to help burn more calories and lose more body fat. Cardiovascular exercise helps improve heart health, metabolism, hormonal balance, blood sugar control, and recovery from fatigue. Resistance training improves strength, endurance, balance, coordination, reaction time, metabolism, and muscle mass. Flexibility exercise improves circulation, posture, and balance. Seek guidance from your physician and exercise physiologist before implementing an exercise routine and learn your capabilities and proper form for all exercise.  Introduction to Yoga  Clinical staff conducted group or individual video education with verbal and written material and guidebook.  Patient learns about yoga, a discipline of the coming together of mind, breath, and body. The benefits of yoga include improved flexibility, improved range of motion, better posture and core strength, increased lung function, weight loss, and positive self-image. Yoga's heart health benefits include lowered blood pressure, healthier heart rate, decreased cholesterol and triglyceride levels, improved immune function, and reduced stress. Seek guidance from your physician and exercise  physiologist before implementing an exercise routine and learn your capabilities and proper form for all exercise.  Medical   Aging: Enhancing Your Quality of Life  Clinical staff conducted group or individual video education with verbal and written material and guidebook.  Patient learns key strategies and recommendations to stay in good physical health and enhance quality of life, such as prevention strategies, having an advocate, securing a Health Care Proxy and Power of Attorney, and keeping a list of medications and system for tracking them. It also discusses how to avoid risk for bone loss.  Biology of Weight Control  Clinical staff conducted group or individual video education with verbal and written material and guidebook.  Patient learns that weight gain occurs because we consume more calories than we burn (eating more, moving less). Even if your body weight is normal, you may have higher ratios of fat compared to muscle mass. Too much body fat puts you at increased risk for cardiovascular disease, heart attack, stroke, type 2 diabetes, and obesity-related cancers. In addition to exercise, following the Pritikin Eating Plan can help reduce your risk.  Decoding Lab Results  Clinical staff conducted group or individual video education with verbal and written material and guidebook.  Patient learns that lab test reflects one measurement whose values change over time and are influenced by many factors, including medication, stress, sleep, exercise, food, hydration, pre-existing medical conditions, and more. It is recommended to use the knowledge from this video to become more involved with your lab results  and evaluate your numbers to speak with your doctor.   Diseases of Our Time - Overview  Clinical staff conducted group or individual video education with verbal and written material and guidebook.  Patient learns that according to the CDC, 50% to 70% of chronic diseases (such as obesity,  type 2 diabetes, elevated lipids, hypertension, and heart disease) are avoidable through lifestyle improvements including healthier food choices, listening to satiety cues, and increased physical activity.  Sleep Disorders Clinical staff conducted group or individual video education with verbal and written material and guidebook.  Patient learns how good quality and duration of sleep are important to overall health and well-being. Patient also learns about sleep disorders and how they impact health along with recommendations to address them, including discussing with a physician.  Nutrition  Dining Out - Part 2 Clinical staff conducted group or individual video education with verbal and written material and guidebook.  Patient learns how to plan ahead and communicate in order to maximize their dining experience in a healthy and nutritious manner. Included are recommended food choices based on the type of restaurant the patient is visiting.   Fueling a Banker conducted group or individual video education with verbal and written material and guidebook.  There is a strong connection between our food choices and our health. Diseases like obesity and type 2 diabetes are very prevalent and are in large-part due to lifestyle choices. The Pritikin Eating Plan provides plenty of food and hunger-curbing satisfaction. It is easy to follow, affordable, and helps reduce health risks.  Menu Workshop  Clinical staff conducted group or individual video education with verbal and written material and guidebook.  Patient learns that restaurant meals can sabotage health goals because they are often packed with calories, fat, sodium, and sugar. Recommendations include strategies to plan ahead and to communicate with the manager, chef, or server to help order a healthier meal.  Planning Your Eating Strategy  Clinical staff conducted group or individual video education with verbal and written  material and guidebook.  Patient learns about the Pritikin Eating Plan and its benefit of reducing the risk of disease. The Pritikin Eating Plan does not focus on calories. Instead, it emphasizes high-quality, nutrient-rich foods. By knowing the characteristics of the foods, we choose, we can determine their calorie density and make informed decisions.  Targeting Your Nutrition Priorities  Clinical staff conducted group or individual video education with verbal and written material and guidebook.  Patient learns that lifestyle habits have a tremendous impact on disease risk and progression. This video provides eating and physical activity recommendations based on your personal health goals, such as reducing LDL cholesterol, losing weight, preventing or controlling type 2 diabetes, and reducing high blood pressure.  Vitamins and Minerals  Clinical staff conducted group or individual video education with verbal and written material and guidebook.  Patient learns different ways to obtain key vitamins and minerals, including through a recommended healthy diet. It is important to discuss all supplements you take with your doctor.   Healthy Mind-Set    Smoking Cessation  Clinical staff conducted group or individual video education with verbal and written material and guidebook.  Patient learns that cigarette smoking and tobacco addiction pose a serious health risk which affects millions of people. Stopping smoking will significantly reduce the risk of heart disease, lung disease, and many forms of cancer. Recommended strategies for quitting are covered, including working with your doctor to develop a successful plan.  Culinary  Becoming a Set Designer conducted group or individual video education with verbal and written material and guidebook.  Patient learns that cooking at home can be healthy, cost-effective, quick, and puts them in control. Keys to cooking healthy recipes will  include looking at your recipe, assessing your equipment needs, planning ahead, making it simple, choosing cost-effective seasonal ingredients, and limiting the use of added fats, salts, and sugars.  Cooking - Breakfast and Snacks  Clinical staff conducted group or individual video education with verbal and written material and guidebook.  Patient learns how important breakfast is to satiety and nutrition through the entire day. Recommendations include key foods to eat during breakfast to help stabilize blood sugar levels and to prevent overeating at meals later in the day. Planning ahead is also a key component.  Cooking - Educational Psychologist conducted group or individual video education with verbal and written material and guidebook.  Patient learns eating strategies to improve overall health, including an approach to cook more at home. Recommendations include thinking of animal protein as a side on your plate rather than center stage and focusing instead on lower calorie dense options like vegetables, fruits, whole grains, and plant-based proteins, such as beans. Making sauces in large quantities to freeze for later and leaving the skin on your vegetables are also recommended to maximize your experience.  Cooking - Healthy Salads and Dressing Clinical staff conducted group or individual video education with verbal and written material and guidebook.  Patient learns that vegetables, fruits, whole grains, and legumes are the foundations of the Pritikin Eating Plan. Recommendations include how to incorporate each of these in flavorful and healthy salads, and how to create homemade salad dressings. Proper handling of ingredients is also covered. Cooking - Soups and [employer] - Soups and Desserts Clinical staff conducted group or individual video education with verbal and written material and guidebook.  Patient learns that Pritikin soups and desserts make for easy, nutritious, and  delicious snacks and meal components that are low in sodium, fat, sugar, and calorie density, while high in vitamins, minerals, and filling fiber. Recommendations include simple and healthy ideas for soups and desserts.   Overview     The Pritikin Solution Program Overview Clinical staff conducted group or individual video education with verbal and written material and guidebook.  Patient learns that the results of the Pritikin Program have been documented in more than 100 articles published in peer-reviewed journals, and the benefits include reducing risk factors for (and, in some cases, even reversing) high cholesterol, high blood pressure, type 2 diabetes, obesity, and more! An overview of the three key pillars of the Pritikin Program will be covered: eating well, doing regular exercise, and having a healthy mind-set.  WORKSHOPS  Exercise: Exercise Basics: Building Your Action Plan Clinical staff led group instruction and group discussion with PowerPoint presentation and patient guidebook. To enhance the learning environment the use of posters, models and videos may be added. At the conclusion of this workshop, patients will comprehend the difference between physical activity and exercise, as well as the benefits of incorporating both, into their routine. Patients will understand the FITT (Frequency, Intensity, Time, and Type) principle and how to use it to build an exercise action plan. In addition, safety concerns and other considerations for exercise and cardiac rehab will be addressed by the presenter. The purpose of this lesson is to promote a comprehensive and effective weekly exercise routine in order to improve  patients' overall level of fitness.   Managing Heart Disease: Your Path to a Healthier Heart Clinical staff led group instruction and group discussion with PowerPoint presentation and patient guidebook. To enhance the learning environment the use of posters, models and videos  may be added.At the conclusion of this workshop, patients will understand the anatomy and physiology of the heart. Additionally, they will understand how Pritikin's three pillars impact the risk factors, the progression, and the management of heart disease.  The purpose of this lesson is to provide a high-level overview of the heart, heart disease, and how the Pritikin lifestyle positively impacts risk factors.  Exercise Biomechanics Clinical staff led group instruction and group discussion with PowerPoint presentation and patient guidebook. To enhance the learning environment the use of posters, models and videos may be added. Patients will learn how the structural parts of their bodies function and how these functions impact their daily activities, movement, and exercise. Patients will learn how to promote a neutral spine, learn how to manage pain, and identify ways to improve their physical movement in order to promote healthy living. The purpose of this lesson is to expose patients to common physical limitations that impact physical activity. Participants will learn practical ways to adapt and manage aches and pains, and to minimize their effect on regular exercise. Patients will learn how to maintain good posture while sitting, walking, and lifting.  Balance Training and Fall Prevention  Clinical staff led group instruction and group discussion with PowerPoint presentation and patient guidebook. To enhance the learning environment the use of posters, models and videos may be added. At the conclusion of this workshop, patients will understand the importance of their sensorimotor skills (vision, proprioception, and the vestibular system) in maintaining their ability to balance as they age. Patients will apply a variety of balancing exercises that are appropriate for their current level of function. Patients will understand the common causes for poor balance, possible solutions to these  problems, and ways to modify their physical environment in order to minimize their fall risk. The purpose of this lesson is to teach patients about the importance of maintaining balance as they age and ways to minimize their risk of falling.  WORKSHOPS   Nutrition:  Fueling a Ship Broker led group instruction and group discussion with PowerPoint presentation and patient guidebook. To enhance the learning environment the use of posters, models and videos may be added. Patients will review the foundational principles of the Pritikin Eating Plan and understand what constitutes a serving size in each of the food groups. Patients will also learn Pritikin-friendly foods that are better choices when away from home and review make-ahead meal and snack options. Calorie density will be reviewed and applied to three nutrition priorities: weight maintenance, weight loss, and weight gain. The purpose of this lesson is to reinforce (in a group setting) the key concepts around what patients are recommended to eat and how to apply these guidelines when away from home by planning and selecting Pritikin-friendly options. Patients will understand how calorie density may be adjusted for different weight management goals.  Mindful Eating  Clinical staff led group instruction and group discussion with PowerPoint presentation and patient guidebook. To enhance the learning environment the use of posters, models and videos may be added. Patients will briefly review the concepts of the Pritikin Eating Plan and the importance of low-calorie dense foods. The concept of mindful eating will be introduced as well as the importance of paying attention to internal  hunger signals. Triggers for non-hunger eating and techniques for dealing with triggers will be explored. The purpose of this lesson is to provide patients with the opportunity to review the basic principles of the Pritikin Eating Plan, discuss the value of  eating mindfully and how to measure internal cues of hunger and fullness using the Hunger Scale. Patients will also discuss reasons for non-hunger eating and learn strategies to use for controlling emotional eating.  Targeting Your Nutrition Priorities Clinical staff led group instruction and group discussion with PowerPoint presentation and patient guidebook. To enhance the learning environment the use of posters, models and videos may be added. Patients will learn how to determine their genetic susceptibility to disease by reviewing their family history. Patients will gain insight into the importance of diet as part of an overall healthy lifestyle in mitigating the impact of genetics and other environmental insults. The purpose of this lesson is to provide patients with the opportunity to assess their personal nutrition priorities by looking at their family history, their own health history and current risk factors. Patients will also be able to discuss ways of prioritizing and modifying the [facility] Eating Plan for their highest risk areas  Menu  Clinical staff led group instruction and group discussion with PowerPoint presentation and patient guidebook. To enhance the learning environment the use of posters, models and videos may be added. Using menus brought in from [employer], or printed from [employer], patients will apply the Prit[facility]in dining out guidelines that were presented in the [employer] video. Patients will also be able to practice these guidelines in a variety of provided scenarios. The purpose of this lesson is to provide patients with the opportunity to practice hands-on learning of the [facility] Dining Out guidelines with actual menus and practice scenarios.  Label Reading Clinical staff led group instruction and group discussion with PowerPoint presentation and patient guidebook. To enhance the learning environment the use of posters, models and videos may be  added. Patients will review and discuss the [facility]in label reading guidelines presented in [employer]'s Label Reading Educational series video. Using fool labels brought in from local grocery stores and markets, patients will apply the label reading guidelines and determine if the packaged food meet the [facility]ritikin guidelines. The purpose of this lesson is to provide patients with the opportunity to review, discuss, and practice hands-on learning of the [facility] Label Reading guidelines with actual packaged food labels. Cooking School  [employer]'s [employer] are designed to teach patients ways to prepare quick, simple, and affordable recipes at home. The importance of nutrition's role in chronic disease risk reduction is reflected in its emphasis in the overall [facility]in program. By learning how to prepare essential core P[facility]in Eating Plan recipes, patients will increase control over what they eat; be able to customize the flavor of foods without the use of added salt, sugar, or fat; and improve the quality of the food they consume. By learning a set of core recipes which are easily assembled, quickly prepared, and affordable, patients are more likely to prepare more healthy foods at home. These workshops focus on convenient breakfasts, simple entres, side dishes, and desserts which can be prepared with minimal effort and are consistent with nutrition recommendations for cardiovascular risk reduction. Cooking [employer] are taught by a armed forces logistics/support/administrative officer (RD) who has been trained by the [employer]. The chef or RD has a clear understanding of the importance of minimizing - if not completely eliminating -  added fat, sugar, and sodium in recipes. Throughout the series of Cooking School Workshop sessions, patients will learn about healthy ingredients and efficient methods of cooking to build confidence in their capability to prepare    Cooking School weekly topics:  Adding  Flavor- Sodium-Free  Fast and Healthy Breakfasts  Powerhouse Plant-Based Proteins  Satisfying Salads and Dressings  Simple Sides and Sauces  International Cuisine-Spotlight on the [employer] Zones  Delicious Desserts  Savory Soups  [employer] - Meals in a Astronomer Appetizers and Snacks  Comforting Weekend Breakfasts  One-Pot Wonders   Fast Evening Meals  Landscape Architect Your Pritikin Plate  WORKSHOPS   Healthy Mindset (Psychosocial):  Focused Goals, Sustainable Changes Clinical staff led group instruction and group discussion with PowerPoint presentation and patient guidebook. To enhance the learning environment the use of posters, models and videos may be added. Patients will be able to apply effective goal setting strategies to establish at least one personal goal, and then take consistent, meaningful action toward that goal. They will learn to identify common barriers to achieving personal goals and develop strategies to overcome them. Patients will also gain an understanding of how our mind-set can impact our ability to achieve goals and the importance of cultivating a positive and growth-oriented mind-set. The purpose of this lesson is to provide patients with a deeper understanding of how to set and achieve personal goals, as well as the tools and strategies needed to overcome common obstacles which may arise along the way.  From Head to Heart: The Power of a Healthy Outlook  Clinical staff led group instruction and group discussion with PowerPoint presentation and patient guidebook. To enhance the learning environment the use of posters, models and videos may be added. Patients will be able to recognize and describe the impact of emotions and mood on physical health. They will discover the importance of self-care and explore self-care practices which may work for them. Patients will also learn how to utilize the 4 C's to cultivate a healthier outlook and better  manage stress and challenges. The purpose of this lesson is to demonstrate to patients how a healthy outlook is an essential part of maintaining good health, especially as they continue their cardiac rehab journey.  Healthy Sleep for a Healthy Heart Clinical staff led group instruction and group discussion with PowerPoint presentation and patient guidebook. To enhance the learning environment the use of posters, models and videos may be added. At the conclusion of this workshop, patients will be able to demonstrate knowledge of the importance of sleep to overall health, well-being, and quality of life. They will understand the symptoms of, and treatments for, common sleep disorders. Patients will also be able to identify daytime and nighttime behaviors which impact sleep, and they will be able to apply these tools to help manage sleep-related challenges. The purpose of this lesson is to provide patients with a general overview of sleep and outline the importance of quality sleep. Patients will learn about a few of the most common sleep disorders. Patients will also be introduced to the concept of "sleep hygiene," and discover ways to self-manage certain sleeping problems through simple daily behavior changes. Finally, the workshop will motivate patients by clarifying the links between quality sleep and their goals of heart-healthy living.   Recognizing and Reducing Stress Clinical staff led group instruction and group discussion with PowerPoint presentation and patient guidebook. To enhance the learning environment the use of posters, models and videos may be  added. At the conclusion of this workshop, patients will be able to understand the types of stress reactions, differentiate between acute and chronic stress, and recognize the impact that chronic stress has on their health. They will also be able to apply different coping mechanisms, such as reframing negative self-talk. Patients will have the opportunity  to practice a variety of stress management techniques, such as deep abdominal breathing, progressive muscle relaxation, and/or guided imagery.  The purpose of this lesson is to educate patients on the role of stress in their lives and to provide healthy techniques for coping with it.  Learning Barriers/Preferences:  Learning Barriers/Preferences - 09/29/23 1249       Learning Barriers/Preferences   Learning Barriers Exercise Concerns   Poor balance   Learning Preferences Group Instruction;Individual Instruction;Skilled Demonstration;Verbal Instruction          Education Topics:  Knowledge Questionnaire Score:  Knowledge Questionnaire Score - 09/29/23 1249       Knowledge Questionnaire Score   Pre Score 20/24          Core Components/Risk Factors/Patient Goals at Admission:  Personal Goals and Risk Factors at Admission - 09/29/23 1256       Core Components/Risk Factors/Patient Goals on Admission    Weight Management Yes;Weight Maintenance    Intervention Weight Management: Develop a combined nutrition and exercise program designed to reach desired caloric intake, while maintaining appropriate intake of nutrient and fiber, sodium and fats, and appropriate energy expenditure required for the weight goal.;Weight Management/Obesity: Establish reasonable short term and long term weight goals.;Weight Management: Provide education and appropriate resources to help participant work on and attain dietary goals.    Expected Outcomes Short Term: Continue to assess and modify interventions until short term weight is achieved;Long Term: Adherence to nutrition and physical activity/exercise program aimed toward attainment of established weight goal;Weight Maintenance: Understanding of the daily nutrition guidelines, which includes 25-35% calories from fat, 7% or less cal from saturated fats, less than 200mg  cholesterol, less than 1.5gm of sodium, & 5 or more servings of fruits and vegetables  daily;Understanding recommendations for meals to include 15-35% energy as protein, 25-35% energy from fat, 35-60% energy from carbohydrates, less than 200mg  of dietary cholesterol, 20-35 gm of total fiber daily;Understanding of distribution of calorie intake throughout the day with the consumption of 4-5 meals/snacks    Hypertension Yes    Intervention Provide education on lifestyle modifcations including regular physical activity/exercise, weight management, moderate sodium restriction and increased consumption of fresh fruit, vegetables, and low fat dairy, alcohol moderation, and smoking cessation.;Monitor prescription use compliance.    Expected Outcomes Short Term: Continued assessment and intervention until BP is < 140/12mm HG in hypertensive participants. < 130/78mm HG in hypertensive participants with diabetes, heart failure or chronic kidney disease.;Long Term: Maintenance of blood pressure at goal levels.    Lipids Yes    Intervention Provide education and support for participant on nutrition & aerobic/resistive exercise along with prescribed medications to achieve LDL 70mg , HDL >40mg .    Expected Outcomes Short Term: Participant states understanding of desired cholesterol values and is compliant with medications prescribed. Participant is following exercise prescription and nutrition guidelines.;Long Term: Cholesterol controlled with medications as prescribed, with individualized exercise RX and with personalized nutrition plan. Value goals: LDL < 70mg , HDL > 40 mg.    Stress Yes    Intervention Offer individual and/or small group education and counseling on adjustment to heart disease, stress management and health-related lifestyle change. Teach and support self-help strategies.;Refer  participants experiencing significant psychosocial distress to appropriate mental health specialists for further evaluation and treatment. When possible, include family members and significant others in  education/counseling sessions.    Expected Outcomes Short Term: Participant demonstrates changes in health-related behavior, relaxation and other stress management skills, ability to obtain effective social support, and compliance with psychotropic medications if prescribed.;Long Term: Emotional wellbeing is indicated by absence of clinically significant psychosocial distress or social isolation.    Personal Goal Other Yes    Personal Goal ST: know limits (signs and symptoms). LT: Independance, back to ADL's    Intervention Will continue to monitor pt and progress workloads as tolerated without sign or symptom    Expected Outcomes Pt will achieve her goals and increase strength          Core Components/Risk Factors/Patient Goals Review:   Goals and Risk Factor Review     Row Name 10/11/23 0811 11/07/23 0802           Core Components/Risk Factors/Patient Goals Review   Personal Goals Review Weight Management/Obesity;Lipids;Hypertension;Stress Weight Management/Obesity;Lipids;Hypertension;Stress      Review Juwana started cardiac rehab on 10/05/23. Khalessi is off to a good start to exercise. Vital signs have been stable. Tezra is doing well  exercise at cardiac rehab. Vital signs have been stable. Kruti has increased her met levels. Verdene has lsot 1.7 kg since starting cardiac rehab      Expected Outcomes Chyane will continue to participate in cardiac rehab for exercise, nutrition and lifestyle modifications. Kewanna will continue to participate in cardiac rehab for exercise, nutrition and lifestyle modifications.         Core Components/Risk Factors/Patient Goals at Discharge (Final Review):   Goals and Risk Factor Review - 11/07/23 0802       Core Components/Risk Factors/Patient Goals Review   Personal Goals Review Weight Management/Obesity;Lipids;Hypertension;Stress    Review Christol is doing well  exercise at cardiac rehab. Vital signs have been stable. Temeca has increased her met  levels. Nga has lsot 1.7 kg since starting cardiac rehab    Expected Outcomes Montanna will continue to participate in cardiac rehab for exercise, nutrition and lifestyle modifications.          ITP Comments:  ITP Comments     Row Name 09/29/23 9078 10/05/23 1134 10/11/23 0807 11/07/23 0751     ITP Comments Wilbert Bihari, MD.  Introduction to the [facility] Education Program / Intensive Cardiac Rehab.  Initial orientation packet reviewed with the patient. 30-day ITP review. Rand started cardiac rehab and was able to tolerated low inensity exercise fair for 21 minutes. WIll continue to progress as tolerated. 30-day ITP review. Jannelle started cardiac rehab on 10/05/23. Aleshka is off to a fair start to exercise for her fitness level. 30-day ITP review. Pricella has good attendance and participation with exercise at cardiac rehab       Comments: See ITP Comments

## 2023-11-09 ENCOUNTER — Encounter (HOSPITAL_COMMUNITY)
Admission: RE | Admit: 2023-11-09 | Discharge: 2023-11-09 | Disposition: A | Discharge status: Home / Self Care | Source: Ambulatory Visit | Attending: Cardiology | Admitting: Cardiology

## 2023-11-09 DIAGNOSIS — I214 Non-ST elevation (NSTEMI) myocardial infarction: Secondary | ICD-10-CM | POA: Diagnosis not present

## 2023-11-11 ENCOUNTER — Encounter (HOSPITAL_COMMUNITY)
Admission: RE | Admit: 2023-11-11 | Discharge: 2023-11-11 | Disposition: A | Discharge status: Home / Self Care | Source: Ambulatory Visit | Attending: Cardiology | Admitting: Cardiology

## 2023-11-11 DIAGNOSIS — I214 Non-ST elevation (NSTEMI) myocardial infarction: Secondary | ICD-10-CM | POA: Diagnosis not present

## 2023-11-14 ENCOUNTER — Encounter (HOSPITAL_COMMUNITY)
Admission: RE | Admit: 2023-11-14 | Discharge: 2023-11-14 | Disposition: A | Source: Ambulatory Visit | Attending: Cardiology

## 2023-11-14 DIAGNOSIS — I214 Non-ST elevation (NSTEMI) myocardial infarction: Secondary | ICD-10-CM

## 2023-11-16 ENCOUNTER — Encounter (HOSPITAL_COMMUNITY)

## 2023-11-18 ENCOUNTER — Encounter (HOSPITAL_COMMUNITY)
Admission: RE | Admit: 2023-11-18 | Discharge: 2023-11-18 | Disposition: A | Discharge status: Home / Self Care | Source: Ambulatory Visit | Attending: Cardiology | Admitting: Cardiology

## 2023-11-18 DIAGNOSIS — I214 Non-ST elevation (NSTEMI) myocardial infarction: Secondary | ICD-10-CM | POA: Diagnosis not present

## 2023-11-21 ENCOUNTER — Encounter (HOSPITAL_COMMUNITY)
Admission: RE | Admit: 2023-11-21 | Discharge: 2023-11-21 | Disposition: A | Source: Ambulatory Visit | Attending: Cardiology

## 2023-11-21 DIAGNOSIS — I214 Non-ST elevation (NSTEMI) myocardial infarction: Secondary | ICD-10-CM | POA: Diagnosis not present

## 2023-11-23 ENCOUNTER — Encounter (HOSPITAL_COMMUNITY)
Admission: RE | Admit: 2023-11-23 | Discharge: 2023-11-23 | Disposition: A | Discharge status: Home / Self Care | Source: Ambulatory Visit | Attending: Cardiology | Admitting: Cardiology

## 2023-11-23 DIAGNOSIS — I214 Non-ST elevation (NSTEMI) myocardial infarction: Secondary | ICD-10-CM

## 2023-11-24 ENCOUNTER — Ambulatory Visit: Attending: Cardiology | Admitting: Cardiology

## 2023-11-24 ENCOUNTER — Encounter: Payer: Self-pay | Admitting: Cardiology

## 2023-11-24 VITALS — BP 106/54 | HR 76 | Ht 63.0 in | Wt 137.0 lb

## 2023-11-24 DIAGNOSIS — I5022 Chronic systolic (congestive) heart failure: Secondary | ICD-10-CM

## 2023-11-24 DIAGNOSIS — E782 Mixed hyperlipidemia: Secondary | ICD-10-CM

## 2023-11-24 DIAGNOSIS — I10 Essential (primary) hypertension: Secondary | ICD-10-CM

## 2023-11-24 DIAGNOSIS — I5181 Takotsubo syndrome: Secondary | ICD-10-CM

## 2023-11-24 LAB — LIPID PANEL
Chol/HDL Ratio: 2.2 ratio (ref 0.0–4.4)
Cholesterol, Total: 146 mg/dL (ref 100–199)
HDL: 66 mg/dL (ref >39)
LDL Chol Calc (NIH): 70 mg/dL (ref 0–99)
Triglycerides: 44 mg/dL (ref 0–149)
VLDL Cholesterol Cal: 10 mg/dL (ref 5–40)

## 2023-11-24 NOTE — Progress Notes (Signed)
 Cardiology Office Note:  .   Date:  11/24/2023  ID:  Kelly Snyder, DOB 1945-11-16, MRN [id number] PCP: Lonnie Earnest, MD  [facility] Providers Cardiologist:  Newman Lawrence, MD PCP: Lonnie Earnest, MD  Chief Complaint  Patient presents with   Cardiomyopathy     Kelly Snyder is a 78 y.o. female with hypertension, hyperlipidemia, COPD, former 59 PY smoker, admitted for chest pain, stress-induced cardiomyopathy   History of Present Illness  Patient was hospitalized in 09/2023 with chest pain, troponin elevation.  EF was reduced at 35-40%.  Coronary angiogram showed no significant coronary artery disease.  It was deemed that was her presentation was that of stress-induced cardiomyopathy.  Patient has been doing well since discharge.  She is compliant with medical therapy, denies any chest pain, shortness of breath, leg swelling, lightheadedness symptoms.     Vitals:   11/24/23 0906  BP: (!) 106/54  Pulse: 76  SpO2: 95%      Review of Systems  Cardiovascular:  Negative for chest pain, dyspnea on exertion, leg swelling, palpitations and syncope.        Studies Reviewed: SABRA        EKG 11/24/2023: Normal sinus rhythm Normal ECG When compared with ECG of 12-Sep-2023 22:58, No significant change was found  Echocardiogram 09/2023:  1. No left ventricular thrombus is seen (Definity  contrast was used).  Left ventricular ejection fraction, by estimation, is 35 to 40%. The left  ventricle has moderately decreased function. The left ventricle  demonstrates regional wall motion  abnormalities (see scoring diagram/findings for description). Left  ventricular diastolic parameters are consistent with Grade II diastolic  dysfunction (pseudonormalization). Elevated left atrial pressure. There is  severe hypokinesis of the left  ventricular, mid-apical anterior wall, apical segment and anteroseptal  wall.   2. Right ventricular systolic function is normal. The  right ventricular  size is normal. There is mildly elevated pulmonary artery systolic  pressure. The estimated right ventricular systolic pressure is 35.2 mmHg.   3. The mitral valve is normal in structure. No evidence of mitral valve  regurgitation. No evidence of mitral stenosis.   4. The aortic valve is tricuspid. Aortic valve regurgitation is not  visualized. No aortic stenosis is present.   5. The inferior vena cava is dilated in size with >50% respiratory  variability, suggesting right atrial pressure of 8 mmHg.   Comparison(s): Prior images reviewed side by side. The left ventricular  function is worsened. The left ventricular wall motion abnormalities are  new. Findings could represent infarction in the LAD artery distribution or  takotsubo syndrome.     Labs 09/2023: Chol 206, TG 34, HDL 84, LDL 115 HbA1C 5.2% Hb 11.6 Cr 0.73, Na 134    Physical Exam Vitals and nursing note reviewed.  Constitutional:      General: She is not in acute distress. Neck:     Vascular: No JVD.  Cardiovascular:     Rate and Rhythm: Normal rate and regular rhythm.     Heart sounds: Normal heart sounds. No murmur heard. Pulmonary:     Effort: Pulmonary effort is normal.     Breath sounds: Normal breath sounds. No wheezing or rales.  Musculoskeletal:     Right lower leg: No edema.     Left lower leg: No edema.      VISIT DIAGNOSES:   ICD-10-CM   1. Chronic systolic heart failure (HCC)  P49.77 EKG 12-Lead    2. Stress-induced cardiomyopathy  I51.81  ECHOCARDIOGRAM COMPLETE    3. Primary hypertension  I10 EKG 12-Lead    4. Mixed hyperlipidemia  E78.2 Lipid panel       Kelly Snyder is a 78 y.o. female with  hypertension, hyperlipidemia, COPD, former 50 PY smoker, admitted for chest pain, stress-induced cardiomyopathy   Assessment & Plan  Stress-induced cardiomyopathy: Clinically, she looks euvolemic today.  I am very optimistic that her EF would have normalized. Check  echocardiogram. For now, continue current GDMT including losartan , spironolactone , metoprolol . At next visit in 6 months, we could potentially stop her aspirin .  Mixed hyperlipidemia: Check lipid panel.  If LDL remains >100, will change pravastatin  to high intensity statin.     F/u in 6 months  Signed, Newman JINNY Lawrence, MD

## 2023-11-24 NOTE — Patient Instructions (Signed)
  Lab Work: Lipid panel   If you have labs (blood work) drawn today and your tests are completely normal, you will receive your results only by: MyChart Message (if you have MyChart) OR A paper copy in the mail If you have any lab test that is abnormal or we need to change your treatment, we will call you to review the results.  Testing/Procedures: Echocardiogram  Your physician has requested that you have an echocardiogram. Echocardiography is a painless test that uses sound waves to create images of your heart. It provides your doctor with information about the size and shape of your heart and how well your heart's chambers and valves are working. This procedure takes approximately one hour. There are no restrictions for this procedure. Please do NOT wear cologne, perfume, aftershave, or lotions (deodorant is allowed). Please arrive 15 minutes prior to your appointment time.  Please note: We ask at that you not bring children with you during ultrasound (echo/ vascular) testing. Due to room size and safety concerns, children are not allowed in the ultrasound rooms during exams. Our front office staff cannot provide observation of children in our lobby area while testing is being conducted. An adult accompanying a patient to their appointment will only be allowed in the ultrasound room at the discretion of the ultrasound technician under special circumstances. We apologize for any inconvenience.   Follow-Up: At [facility], you and your health needs are our priority.  As part of our continuing mission to provide you with exceptional heart care, our providers are all part of one team.  This team includes your primary Cardiologist (physician) and Advanced Practice Providers or APPs (Physician Assistants and Nurse Practitioners) who all work together to provide you with the care you need, when you need it.  Your next appointment:   6 month(s)  Provider:   Newman JINNY Lawrence, MD

## 2023-11-25 ENCOUNTER — Ambulatory Visit: Payer: Self-pay | Admitting: Cardiology

## 2023-11-25 ENCOUNTER — Encounter (HOSPITAL_COMMUNITY)
Admission: RE | Admit: 2023-11-25 | Discharge: 2023-11-25 | Disposition: A | Discharge status: Home / Self Care | Source: Ambulatory Visit | Attending: Cardiology | Admitting: Cardiology

## 2023-11-25 DIAGNOSIS — I214 Non-ST elevation (NSTEMI) myocardial infarction: Secondary | ICD-10-CM

## 2023-11-25 DIAGNOSIS — I350 Nonrheumatic aortic (valve) stenosis: Secondary | ICD-10-CM

## 2023-11-28 ENCOUNTER — Encounter (HOSPITAL_COMMUNITY)
Admission: RE | Admit: 2023-11-28 | Discharge: 2023-11-28 | Disposition: A | Source: Ambulatory Visit | Attending: Cardiology

## 2023-11-28 DIAGNOSIS — I214 Non-ST elevation (NSTEMI) myocardial infarction: Secondary | ICD-10-CM | POA: Diagnosis not present

## 2023-11-30 ENCOUNTER — Encounter (HOSPITAL_COMMUNITY)
Admission: RE | Admit: 2023-11-30 | Discharge: 2023-11-30 | Disposition: A | Source: Ambulatory Visit | Attending: Cardiology

## 2023-11-30 DIAGNOSIS — I214 Non-ST elevation (NSTEMI) myocardial infarction: Secondary | ICD-10-CM

## 2023-12-02 ENCOUNTER — Encounter (HOSPITAL_COMMUNITY)

## 2023-12-05 ENCOUNTER — Encounter (HOSPITAL_COMMUNITY)
Admission: RE | Admit: 2023-12-05 | Discharge: 2023-12-05 | Disposition: A | Source: Ambulatory Visit | Attending: Cardiology

## 2023-12-05 DIAGNOSIS — I214 Non-ST elevation (NSTEMI) myocardial infarction: Secondary | ICD-10-CM | POA: Diagnosis present

## 2023-12-06 NOTE — Progress Notes (Signed)
 Cardiac Individual Treatment Plan  Patient Details  Name: Kelly Snyder MRN: [id number] Date of Birth: Jan 08, 1945 Referring Provider:   Flowsheet Row INTENSIVE CARDIAC REHAB ORIENT from 09/29/2023 in [facility] for Heart, Vascular, & Lung Health  Referring Provider Dr. Newman Lawrence, MD    Initial Encounter Date:  Flowsheet Row INTENSIVE CARDIAC REHAB ORIENT from 09/29/2023 in [facility] for Heart, Vascular, & Lung Health  Date 09/29/23    Visit Diagnosis: 09/12/23 NSTEMI (non-ST elevated myocardial infarction) [facility])  Patient's Home Medications on Admission:  Current Outpatient Medications:    acetaminophen  (TYLENOL ) 500 MG tablet, Take 1,000 mg by mouth every 6 (six) hours as needed for moderate pain (pain). , Disp: , Rfl:    albuterol  (VENTOLIN  HFA) 108 (90 Base) MCG/ACT inhaler, Inhale 2 puffs into the lungs every 6 (six) hours as needed for wheezing or shortness of breath., Disp: , Rfl:    aspirin  81 MG chewable tablet, Chew 1 tablet (81 mg total) by mouth daily., Disp: , Rfl:    Calcium  Carb-Cholecalciferol (CALCIUM  600-D PO), Take 1 tablet by mouth daily., Disp: , Rfl:    cephALEXin  (KEFLEX ) 500 MG capsule, Take 1 capsule (500 mg total) by mouth 4 (four) times daily., Disp: 28 capsule, Rfl: 0   dapagliflozin  propanediol (FARXIGA ) 10 MG TABS tablet, Take 1 tablet (10 mg total) by mouth before breakfast, Disp: 90 tablet, Rfl: 1   ezetimibe  (ZETIA ) 10 MG tablet, Take 1 tablet (10 mg total) by mouth daily., Disp: 90 tablet, Rfl: 1   Lidocaine  (ASPERCREME MAX STRENGTH) 4 % AERO, Apply 1 Application topically daily as needed (hip arthritis pain)., Disp: , Rfl:    losartan  (COZAAR ) 25 MG tablet, Take 0.5 tablets (12.5 mg total) by mouth daily., Disp: 30 tablet, Rfl: 0   metoprolol  succinate (TOPROL -XL) 25 MG 24 hr tablet, Take 0.5 tablets (12.5 mg total) by mouth daily., Disp: 30 tablet, Rfl: 0   Multiple Vitamin (MULTIVITAMIN WITH  MINERALS) TABS tablet, Take 1 tablet by mouth daily., Disp: , Rfl:    nitroGLYCERIN  (NITROSTAT ) 0.4 MG SL tablet, Place 1 tablet (0.4 mg total) under the tongue every 5 (five) minutes as needed for chest pain., Disp: 30 tablet, Rfl: 0   pravastatin  (PRAVACHOL ) 20 MG tablet, Take 1 tablet (20 mg total) by mouth daily at 6 PM., Disp: 90 tablet, Rfl: 1   spironolactone  (ALDACTONE ) 25 MG tablet, Take 0.5 tablets (12.5 mg total) by mouth daily., Disp: 45 tablet, Rfl: 3   Tiotropium Bromide  (SPIRIVA  RESPIMAT) 2.5 MCG/ACT AERS, Inhale 2 puffs into the lungs daily., Disp: 4 g, Rfl: 1  Past Medical History: Past Medical History:  Diagnosis Date   Arthritis    COPD (chronic obstructive pulmonary disease) (HCC)    Pneumonia    Rash 02/17/2023    Tobacco Use: Social History   Tobacco Use  Smoking Status Former   Current packs/day: 1.00   Average packs/day: 1 pack/day for 57.9 years (57.9 ttl pk-yrs)   Types: Cigarettes   Start date: 01/04/1966   Passive exposure: Past  Smokeless Tobacco Never  Tobacco Comments   quit 10/28/2018    Labs: Review Flowsheet  More data exists      Latest Ref Rng & Units 12/28/2021 01/15/2023 09/13/2023 09/16/2023 11/24/2023  Labs for ITP Cardiac and Pulmonary Rehab  Cholestrol 100 - 199 mg/dL - 801  793  - 853   LDL (calc) 0 - 99 mg/dL - 882  884  -  70   HDL-C >39 mg/dL - 74  84  - 66   Trlycerides 0 - 149 mg/dL - 37  34  - 44   Hemoglobin A1c 4.8 - 5.6 % - 5.2  - - -  PH, Arterial 7.35 - 7.45 - - 7.395  - -  PCO2 arterial 32 - 48 mmHg - - 43.5  - -  Bicarbonate 20.0 - 28.0 mmol/L - - 27.4  26.6  - -  TCO2 22 - 32 mmol/L 29  - 29  28  29   -  O2 Saturation % - 98.2  54  87  - -    Details       Multiple values from one day are sorted in reverse-chronological order          Exercise Target Goals: Exercise Program Goal: Individual exercise prescription set using results from initial 6 min walk test and THRR while considering  patient's activity  barriers and safety.   Exercise Prescription Goal: Initial exercise prescription builds to 30-45 minutes a day of aerobic activity, 2-3 days per week.  Home exercise guidelines will be given to patient during program as part of exercise prescription that the participant will acknowledge.   Education: Aerobic Exercise: - Group verbal and visual presentation on the components of exercise prescription. Introduces F.I.T.T principle from ACSM for exercise prescriptions.  Reviews F.I.T.T. principles of aerobic exercise including progression. Written material provided at class time.   Education: Resistance Exercise: - Group verbal and visual presentation on the components of exercise prescription. Introduces F.I.T.T principle from ACSM for exercise prescriptions  Reviews F.I.T.T. principles of resistance exercise including progression. Written material provided at class time.    Education: Exercise & Equipment Safety: - Individual verbal instruction and demonstration of equipment use and safety with use of the equipment.   Education: Exercise Physiology & General Exercise Guidelines: - Group verbal and written instruction with models to review the exercise physiology of the cardiovascular system and associated critical values. Provides general exercise guidelines with specific guidelines to those with heart or lung disease. Written material provided at class time.   Education: Flexibility, Balance, Mind/Body Relaxation: - Group verbal and visual presentation with interactive activity on the components of exercise prescription. Introduces F.I.T.T principle from ACSM for exercise prescriptions. Reviews F.I.T.T. principles of flexibility and balance exercise training including progression. Also discusses the mind body connection.  Reviews various relaxation techniques to help reduce and manage stress (i.e. Deep breathing, progressive muscle relaxation, and visualization). Balance handout provided to  take home. Written material provided at class time.   Activity Barriers & Risk Stratification:  Activity Barriers & Cardiac Risk Stratification - 09/29/23 1243       Activity Barriers & Cardiac Risk Stratification   Activity Barriers Joint Problems;Deconditioning;Arthritis;Balance Concerns    Cardiac Risk Stratification High          6 Minute Walk:  6 Minute Walk     Row Name 09/29/23 1232         6 Minute Walk   Phase Initial     Distance 720 feet     Walk Time 6 minutes     # of Rest Breaks 0     MPH 1.36     METS 1.52     RPE 11     Perceived Dyspnea  0     VO2 Peak 5.33     Symptoms No     Resting HR 74 bpm     Resting  BP 112/58     Resting Oxygen Saturation  94 %     Exercise Oxygen Saturation  during 6 min walk 98 %     Max Ex. HR 90 bpm     Max Ex. BP 128/64     2 Minute Post BP 114/60        Oxygen Initial Assessment:   Oxygen Re-Evaluation:   Oxygen Discharge (Final Oxygen Re-Evaluation):   Initial Exercise Prescription:  Initial Exercise Prescription - 09/29/23 1200       Date of Initial Exercise RX and Referring Provider   Date 09/29/23    Referring Provider Dr. Newman Lawrence, MD    Expected Discharge Date 12/23/23      NuStep   Level 1    SPM 65    Minutes 25    METs 1.5      Prescription Details   Frequency (times per week) 3    Duration Progress to 30 minutes of continuous aerobic without signs/symptoms of physical distress      Intensity   THRR 40-80% of Max Heartrate 57-114    Ratings of Perceived Exertion 11-13    Perceived Dyspnea 0-4      Progression   Progression Continue progressive overload as per policy without signs/symptoms or physical distress.      Resistance Training   Training Prescription Yes    Weight 1    Reps 10-15          Perform Capillary Blood Glucose checks as needed.  Exercise Prescription Changes:   Exercise Prescription Changes     Row Name 10/05/23 1033 10/10/23 1027 10/24/23  1026 11/07/23 1030 11/21/23 1026     Response to Exercise   Blood Pressure (Admit) 138/66 120/60 122/66 102/72 110/58   Blood Pressure (Exercise) 130/60 124/60 118/62 126/56 --   Blood Pressure (Exit) 118/60 98/64 108/68 92/56 112/60   Heart Rate (Admit) 69 bpm 72 bpm 72 bpm 74 bpm 72 bpm   Heart Rate (Exercise) 85 bpm 94 bpm 92 bpm 95 bpm 88 bpm   Heart Rate (Exit) 71 bpm 78 bpm 79 bpm 77 bpm 75 bpm   Rating of Perceived Exertion (Exercise) 13 12 12  11.5 11.5   Symptoms None None None None None   Comments Off to a fair start with exercise. -- -- Increased workload on NuStep --   Duration Progress to 30 minutes of  aerobic without signs/symptoms of physical distress Progress to 30 minutes of  aerobic without signs/symptoms of physical distress Progress to 30 minutes of  aerobic without signs/symptoms of physical distress Progress to 30 minutes of  aerobic without signs/symptoms of physical distress Progress to 30 minutes of  aerobic without signs/symptoms of physical distress   Intensity THRR unchanged THRR unchanged THRR unchanged THRR unchanged THRR unchanged     Progression   Progression Continue to progress workloads to maintain intensity without signs/symptoms of physical distress. Continue to progress workloads to maintain intensity without signs/symptoms of physical distress. Continue to progress workloads to maintain intensity without signs/symptoms of physical distress. Continue to progress workloads to maintain intensity without signs/symptoms of physical distress. Continue to progress workloads to maintain intensity without signs/symptoms of physical distress.   Average METs 1.3 2 2.3 2.1 2     Resistance Training   Training Prescription No Yes Yes Yes Yes   Weight relaxation day. No weights. 1 lb wts 1 lbs 1 lbs 1 lbs   Reps -- 10-15 10-15 10-15 10-15   Time --  5 Minutes 5 Minutes 5 Minutes 5 Minutes     Interval Training   Interval Training No No No No No     Recumbant Bike    Level -- -- -- -- 1   RPM -- -- -- -- 37   Watts -- -- -- -- 8   Minutes -- -- -- -- 15   METs -- -- -- -- 1.7     NuStep   Level 1 1 1 2 2    SPM 37 66 75 76 78   Minutes 21 25 25 30 15    METs 1.3 2 2.3 2.1 2.3     Home Exercise Plan   Plans to continue exercise at -- -- -- [employer] (comment)  Exercise at [employer] (comment)  Exercise at [employer] -- -- -- Add 1 additional day to program exercise sessions. Add 1 additional day to program exercise sessions.   Initial Home Exercises Provided -- -- -- 11/04/23 11/04/23    Row Name 11/30/23 1028             Response to Exercise   Blood Pressure (Admit) 100/54       Blood Pressure (Exit) 104/60       Heart Rate (Admit) 72 bpm       Heart Rate (Exercise) 93 bpm       Heart Rate (Exit) 75 bpm       Rating of Perceived Exertion (Exercise) 12       Symptoms None       Comments Reviewed goals with Reena.       Duration Continue with 30 min of aerobic exercise without signs/symptoms of physical distress.       Intensity THRR unchanged         Progression   Progression Continue to progress workloads to maintain intensity without signs/symptoms of physical distress.       Average METs 2.2         Resistance Training   Training Prescription No       Weight Relaxation day. No weights.         Interval Training   Interval Training No         Recumbant Bike   Level 1       RPM 53       Watts 15       Minutes 15       METs 2.1         NuStep   Level 2       SPM 79       Minutes 15       METs 2.3         Home Exercise Plan   Plans to continue exercise at [employer] (comment)  Exercise at church gym       Frequency Add 1 additional day to program exercise sessions.       Initial Home Exercises Provided 11/04/23          Exercise Comments:   Exercise Comments     Row Name 10/05/23 1134 10/31/23 1057 11/04/23 1047 11/30/23 1115     Exercise Comments Kelly Snyder  tolerated low intensity exercise for 21 minutes fair without symptoms. She was oreinted to the execise equipment and stretching routine. Reviewed goals with Reena. Reviewed home exercise guidelines with Reena. Reviewed goals with Reena.       Exercise Goals and Review:   Exercise Goals  Row Name 09/29/23 9076             Exercise Goals   Increase Physical Activity Yes       Intervention Provide advice, education, support and counseling about physical activity/exercise needs.;Develop an individualized exercise prescription for aerobic and resistive training based on initial evaluation findings, risk stratification, comorbidities and participant's personal goals.       Expected Outcomes Short Term: Attend rehab on a regular basis to increase amount of physical activity.;Long Term: Add in home exercise to make exercise part of routine and to increase amount of physical activity.;Long Term: Exercising regularly at least 3-5 days a week.       Increase Strength and Stamina Yes       Intervention Provide advice, education, support and counseling about physical activity/exercise needs.;Develop an individualized exercise prescription for aerobic and resistive training based on initial evaluation findings, risk stratification, comorbidities and participant's personal goals.       Expected Outcomes Short Term: Increase workloads from initial exercise prescription for resistance, speed, and METs.;Short Term: Perform resistance training exercises routinely during rehab and add in resistance training at home;Long Term: Improve cardiorespiratory fitness, muscular endurance and strength as measured by increased METs and functional capacity ( )       Able to understand and use rate of perceived exertion (RPE) scale Yes       Intervention Provide education and explanation on how to use RPE scale       Expected Outcomes Short Term: Able to use RPE daily in rehab to express subjective intensity  level;Long Term:  Able to use RPE to guide intensity level when exercising independently       Knowledge and understanding of Target Heart Rate Range (THRR) Yes       Intervention Provide education and explanation of THRR including how the numbers were predicted and where they are located for reference       Expected Outcomes Short Term: Able to state/look up THRR;Short Term: Able to use daily as guideline for intensity in rehab;Long Term: Able to use THRR to govern intensity when exercising independently       Understanding of Exercise Prescription Yes       Intervention Provide education, explanation, and written materials on patient's individual exercise prescription       Expected Outcomes Short Term: Able to explain program exercise prescription;Long Term: Able to explain home exercise prescription to exercise independently          Exercise Goals Re-Evaluation :  Exercise Goals Re-Evaluation     Row Name 10/05/23 1134 10/31/23 1057 11/04/23 1047 11/30/23 1115       Exercise Goal Re-Evaluation   Exercise Goals Review Increase Physical Activity;Increase Strength and Stamina;Able to understand and use rate of perceived exertion (RPE) scale Increase Physical Activity;Increase Strength and Stamina;Able to understand and use rate of perceived exertion (RPE) scale Increase Physical Activity;Increase Strength and Stamina;Able to understand and use rate of perceived exertion (RPE) scale;Understanding of Exercise Prescription Increase Physical Activity;Increase Strength and Stamina;Able to understand and use rate of perceived exertion (RPE) scale;Understanding of Exercise Prescription    Comments Kelly Snyder was able to understand and use RPE scale appropriately. [facility] has a gym/ fitness center, and she will look into using the facility for her exercise. Her goal is to gain assurance that she won't have another heart attack. Reviewed exercise prescription with Reena. She will exercise at her  church gym 1-2 days/week. Lynnann has talked to the staff at  her church's gym and was given the hours. She plans to start exercise there in addition to exercise at cardiac rehab.    Expected Outcomes Increase duration from 21 minutes to 30 minutes. Progress workloads as tolerated. Kendy will continue exercise and lifestyle changes to reduce cardiac risk factors. Audyn will exercise at gym 1-2 days/week in addition to exercise at cardiac rehab. Luis will exercise at her church's gym to help build stamina to do ADLs.       Discharge Exercise Prescription (Final Exercise Prescription Changes):  Exercise Prescription Changes - 11/30/23 1028       Response to Exercise   Blood Pressure (Admit) 100/54    Blood Pressure (Exit) 104/60    Heart Rate (Admit) 72 bpm    Heart Rate (Exercise) 93 bpm    Heart Rate (Exit) 75 bpm    Rating of Perceived Exertion (Exercise) 12    Symptoms None    Comments Reviewed goals with Reena.    Duration Continue with 30 min of aerobic exercise without signs/symptoms of physical distress.    Intensity THRR unchanged      Progression   Progression Continue to progress workloads to maintain intensity without signs/symptoms of physical distress.    Average METs 2.2      Resistance Training   Training Prescription No    Weight Relaxation day. No weights.      Interval Training   Interval Training No      Recumbant Bike   Level 1    RPM 53    Watts 15    Minutes 15    METs 2.1      NuStep   Level 2    SPM 79    Minutes 15    METs 2.3      Home Exercise Plan   Plans to continue exercise at [employer] (comment)   Exercise at church gym   Frequency Add 1 additional day to program exercise sessions.    Initial Home Exercises Provided 11/04/23          Nutrition:  Target Goals: Understanding of nutrition guidelines, daily intake of sodium 1500mg , cholesterol 200mg , calories 30% from fat and 7% or less from saturated fats, daily to have 5  or more servings of fruits and vegetables.  Education: Nutrition 1 -Group instruction provided by verbal, written material, interactive activities, discussions, models, and posters to present general guidelines for heart healthy nutrition including macronutrients, label reading, and promoting whole foods over processed counterparts. Education serves as pensions consultant of discussion of heart healthy eating for all. Written material provided at class time.    Education: Nutrition 2 -Group instruction provided by verbal, written material, interactive activities, discussions, models, and posters to present general guidelines for heart healthy nutrition including sodium, cholesterol, and saturated fat. Providing guidance of habit forming to improve blood pressure, cholesterol, and body weight. Written material provided at class time.     Biometrics:  Pre Biometrics - 09/29/23 0921       Pre Biometrics   Waist Circumference 32 inches    Hip Circumference 41.5 inches    Waist to Hip Ratio 0.77 %    Triceps Skinfold 15 mm    % Body Fat 33.4 %    Grip Strength 10 kg    Flexibility 11.5 in    Single Leg Stand 3 seconds           Nutrition Therapy Plan and Nutrition Goals:   Nutrition Assessments:  MEDIFICTS Score Key: >=  70 Need to make dietary changes  40-70 Heart Healthy Diet <= 40 Therapeutic Level Cholesterol Diet  Flowsheet Row INTENSIVE CARDIAC REHAB from 10/05/2023 in [facility] for Heart, Vascular, & Lung Health  Picture Your Plate Total Score on Admission 77   Picture Your Plate Scores: <59 Unhealthy dietary pattern with much room for improvement. 41-50 Dietary pattern unlikely to meet recommendations for good health and room for improvement. 51-60 More healthful dietary pattern, with some room for improvement.  >60 Healthy dietary pattern, although there may be some specific behaviors that could be improved.    Nutrition Goals  Re-Evaluation:   Nutrition Goals Discharge (Final Nutrition Goals Re-Evaluation):   Psychosocial: Target Goals: Acknowledge presence or absence of significant depression and/or stress, maximize coping skills, provide positive support system. Participant is able to verbalize types and ability to use techniques and skills needed for reducing stress and depression.   Education: Stress, Anxiety, and Depression - Group verbal and visual presentation to define topics covered.  Reviews how body is impacted by stress, anxiety, and depression.  Also discusses healthy ways to reduce stress and to treat/manage anxiety and depression. Written material provided at class time.   Education: Sleep Hygiene -Provides group verbal and written instruction about how sleep can affect your health.  Define sleep hygiene, discuss sleep cycles and impact of sleep habits. Review good sleep hygiene tips.   Initial Review & Psychosocial Screening:  Initial Psych Review & Screening - 09/29/23 1247       Initial Review   Current issues with Current Stress Concerns    Source of Stress Concerns Family    Comments Some mild stress with the well being of her family. Overal she feels good and has a good support group with her church family, son and his girlfriend. No needs at this time      Family Dynamics   Good Support System? Yes      Barriers   Psychosocial barriers to participate in program The patient should benefit from training in stress management and relaxation.      Screening Interventions   Interventions Encouraged to exercise;To provide support and resources with identified psychosocial needs;Provide feedback about the scores to participant    Expected Outcomes Long Term Goal: Stressors or current issues are controlled or eliminated.;Short Term goal: Identification and review with participant of any Quality of Life or Depression concerns found by scoring the questionnaire.;Long Term goal: The participant  improves quality of Life and PHQ9 Scores as seen by post scores and/or verbalization of changes          Quality of Life Scores:   Quality of Life - 09/29/23 1248       Quality of Life   Select Quality of Life      Quality of Life Scores   Health/Function Pre 21.69 %    Socioeconomic Pre 23 %    Psych/Spiritual Pre 26.57 %    Family Pre 28.5 %    GLOBAL Pre 24 %         Scores of 19 and below usually indicate a poorer quality of life in these areas.  A difference of  2-3 points is a clinically meaningful difference.  A difference of 2-3 points in the total score of the Quality of Life Index has been associated with significant improvement in overall quality of life, self-image, physical symptoms, and general health in studies assessing change in quality of life.  PHQ-9: Review Flowsheet  More  data exists      09/29/2023 09/19/2023 04/25/2023 04/14/2023 02/17/2023  Depression screen PHQ 2/9  Decreased Interest 0 0 0 0 0  Down, Depressed, Hopeless 0 0 0 0 0  PHQ - 2 Score 0 0 0 0 0  Altered sleeping 0 0 0 0 0  Tired, decreased energy 0 0 0 0 0  Change in appetite 0 0 0 0 0  Feeling bad or failure about yourself  0 0 0 0 0  Trouble concentrating 0 0 0 0 0  Moving slowly or fidgety/restless 0 0 0 0 0  Suicidal thoughts 0 0 0 0 0  PHQ-9 Score 0  0  0  0  0     Details       Data saved with a previous flowsheet row definition        Interpretation of Total Score  Total Score Depression Severity:  1-4 = Minimal depression, 5-9 = Mild depression, 10-14 = Moderate depression, 15-19 = Moderately severe depression, 20-27 = Severe depression   Psychosocial Evaluation and Intervention:   Psychosocial Re-Evaluation:  Psychosocial Re-Evaluation     Row Name 10/11/23 9191 11/07/23 0756 12/06/23 1405         Psychosocial Re-Evaluation   Current issues with Current Stress Concerns Current Stress Concerns Current Stress Concerns     Comments Kelly Snyder has not voiced any  increased concerns or stressors since she has started exercise at cardiac rehab. Kelly Snyder continues not voiced to voice  increased concerns or stressors since she during exercise at cardiac rehab. Kelly Snyder continues not voiced to voice  increased concerns or stressors since she during exercise at cardiac rehab.     Expected Outcomes Kelly Snyder will have controlled or decreased stressors  upon completion of cardiac rehab. Kelly Snyder will have controlled or decreased stressors  upon completion of cardiac rehab. Kelly Snyder will have controlled or decreased stressors  upon completion of cardiac rehab.     Interventions Stress management education;Relaxation education;Encouraged to attend Cardiac Rehabilitation for the exercise Stress management education;Relaxation education;Encouraged to attend Cardiac Rehabilitation for the exercise Stress management education;Relaxation education;Encouraged to attend Cardiac Rehabilitation for the exercise     Continue Psychosocial Services  No Follow up required No Follow up required No Follow up required       Initial Review   Source of Stress Concerns Family Family Family     Comments Will continue to monitor and offer support as needed, Will continue to monitor and offer support as needed, Will continue to monitor and offer support as needed,        Psychosocial Discharge (Final Psychosocial Re-Evaluation):  Psychosocial Re-Evaluation - 12/06/23 1405       Psychosocial Re-Evaluation   Current issues with Current Stress Concerns    Comments Kelly Snyder continues not voiced to voice  increased concerns or stressors since she during exercise at cardiac rehab.    Expected Outcomes Kelly Snyder will have controlled or decreased stressors  upon completion of cardiac rehab.    Interventions Stress management education;Relaxation education;Encouraged to attend Cardiac Rehabilitation for the exercise    Continue Psychosocial Services  No Follow up required      Initial Review   Source of  Stress Concerns Family    Comments Will continue to monitor and offer support as needed,          Vocational Rehabilitation: Provide vocational rehab assistance to qualifying candidates.   Vocational Rehab Evaluation & Intervention:  Vocational Rehab - 09/29/23 1254  Initial Vocational Rehab Evaluation & Intervention   Assessment shows need for Vocational Rehabilitation No   Patient is retired         Education: Education Goals: Education classes will be provided on a variety of topics geared toward better understanding of heart health and risk factor modification. Participant will state understanding/return demonstration of topics presented as noted by education test scores.  Learning Barriers/Preferences:  Learning Barriers/Preferences - 09/29/23 1249       Learning Barriers/Preferences   Learning Barriers Exercise Concerns   Poor balance   Learning Preferences Group Instruction;Individual Instruction;Skilled Demonstration;Verbal Instruction          General Cardiac Education Topics:  AED/CPR: - Group verbal and written instruction with the use of models to demonstrate the basic use of the AED with the basic ABC's of resuscitation.   Test and Procedures: - Group verbal and visual presentation and models provide information about basic cardiac anatomy and function. Reviews the testing methods done to diagnose heart disease and the outcomes of the test results. Describes the treatment choices: Medical Management, Angioplasty, or Coronary Bypass Surgery for treating various heart conditions including Myocardial Infarction, Angina, Valve Disease, and Cardiac Arrhythmias. Written material provided at class time.   Medication Safety: - Group verbal and visual instruction to review commonly prescribed medications for heart and lung disease. Reviews the medication, class of the drug, and side effects. Includes the steps to properly store meds and maintain the  prescription regimen. Written material provided at class time.   Intimacy: - Group verbal instruction through game format to discuss how heart and lung disease can affect sexual intimacy. Written material provided at class time.   Know Your Numbers and Heart Failure: - Group verbal and visual instruction to discuss disease risk factors for cardiac and pulmonary disease and treatment options.  Reviews associated critical values for Overweight/Obesity, Hypertension, Cholesterol, and Diabetes.  Discusses basics of heart failure: signs/symptoms and treatments.  Introduces Heart Failure Zone chart for action plan for heart failure. Written material provided at class time.   Infection Prevention: - Provides verbal and written material to individual with discussion of infection control including proper hand washing and proper equipment cleaning during exercise session.   Falls Prevention: - Provides verbal and written material to individual with discussion of falls prevention and safety.   Other: -Provides group and verbal instruction on various topics (see comments)   Knowledge Questionnaire Score:  Knowledge Questionnaire Score - 09/29/23 1249       Knowledge Questionnaire Score   Pre Score 20/24          Core Components/Risk Factors/Patient Goals at Admission:  Personal Goals and Risk Factors at Admission - 09/29/23 1256       Core Components/Risk Factors/Patient Goals on Admission    Weight Management Yes;Weight Maintenance    Intervention Weight Management: Develop a combined nutrition and exercise program designed to reach desired caloric intake, while maintaining appropriate intake of nutrient and fiber, sodium and fats, and appropriate energy expenditure required for the weight goal.;Weight Management/Obesity: Establish reasonable short term and long term weight goals.;Weight Management: Provide education and appropriate resources to help participant work on and attain  dietary goals.    Expected Outcomes Short Term: Continue to assess and modify interventions until short term weight is achieved;Long Term: Adherence to nutrition and physical activity/exercise program aimed toward attainment of established weight goal;Weight Maintenance: Understanding of the daily nutrition guidelines, which includes 25-35% calories from fat, 7% or less cal from saturated  fats, less than 200mg  cholesterol, less than 1.5gm of sodium, & 5 or more servings of fruits and vegetables daily;Understanding recommendations for meals to include 15-35% energy as protein, 25-35% energy from fat, 35-60% energy from carbohydrates, less than 200mg  of dietary cholesterol, 20-35 gm of total fiber daily;Understanding of distribution of calorie intake throughout the day with the consumption of 4-5 meals/snacks    Hypertension Yes    Intervention Provide education on lifestyle modifcations including regular physical activity/exercise, weight management, moderate sodium restriction and increased consumption of fresh fruit, vegetables, and low fat dairy, alcohol moderation, and smoking cessation.;Monitor prescription use compliance.    Expected Outcomes Short Term: Continued assessment and intervention until BP is < 140/27mm HG in hypertensive participants. < 130/53mm HG in hypertensive participants with diabetes, heart failure or chronic kidney disease.;Long Term: Maintenance of blood pressure at goal levels.    Lipids Yes    Intervention Provide education and support for participant on nutrition & aerobic/resistive exercise along with prescribed medications to achieve LDL 70mg , HDL >40mg .    Expected Outcomes Short Term: Participant states understanding of desired cholesterol values and is compliant with medications prescribed. Participant is following exercise prescription and nutrition guidelines.;Long Term: Cholesterol controlled with medications as prescribed, with individualized exercise RX and with  personalized nutrition plan. Value goals: LDL < 70mg , HDL > 40 mg.    Stress Yes    Intervention Offer individual and/or small group education and counseling on adjustment to heart disease, stress management and health-related lifestyle change. Teach and support self-help strategies.;Refer participants experiencing significant psychosocial distress to appropriate mental health specialists for further evaluation and treatment. When possible, include family members and significant others in education/counseling sessions.    Expected Outcomes Short Term: Participant demonstrates changes in health-related behavior, relaxation and other stress management skills, ability to obtain effective social support, and compliance with psychotropic medications if prescribed.;Long Term: Emotional wellbeing is indicated by absence of clinically significant psychosocial distress or social isolation.    Personal Goal Other Yes    Personal Goal ST: know limits (signs and symptoms). LT: Independance, back to ADL's    Intervention Will continue to monitor pt and progress workloads as tolerated without sign or symptom    Expected Outcomes Pt will achieve her goals and increase strength          Education:Diabetes - Individual verbal and written instruction to review signs/symptoms of diabetes, desired ranges of glucose level fasting, after meals and with exercise. Acknowledge that pre and post exercise glucose checks will be done for 3 sessions at entry of program.   Core Components/Risk Factors/Patient Goals Review:   Goals and Risk Factor Review     Row Name 10/11/23 9188 11/07/23 0802 12/06/23 1406         Core Components/Risk Factors/Patient Goals Review   Personal Goals Review Weight Management/Obesity;Lipids;Hypertension;Stress Weight Management/Obesity;Lipids;Hypertension;Stress Weight Management/Obesity;Lipids;Hypertension;Stress     Review Kelly Snyder started cardiac rehab on 10/05/23. Kelly Snyder is off to a good  start to exercise. Vital signs have been stable. Kelly Snyder is doing well  exercise at cardiac rehab. Vital signs have been stable. Kelly Snyder has increased her met levels. Kelly Snyder has lsot 1.7 kg since starting cardiac rehab Kelly Snyder continues to do  well  exercise at cardiac rehab. Vital signs remain stable. Kelly Snyder has increased her met levels. Kelly Snyder has lsot 2.2 kg since starting cardiac rehab     Expected Outcomes Kelly Snyder will continue to participate in cardiac rehab for exercise, nutrition and lifestyle modifications. Kelly Snyder will continue to  participate in cardiac rehab for exercise, nutrition and lifestyle modifications. Kelly Snyder will continue to participate in cardiac rehab for exercise, nutrition and lifestyle modifications.        Core Components/Risk Factors/Patient Goals at Discharge (Final Review):   Goals and Risk Factor Review - 12/06/23 1406       Core Components/Risk Factors/Patient Goals Review   Personal Goals Review Weight Management/Obesity;Lipids;Hypertension;Stress    Review Kelly Snyder continues to do  well  exercise at cardiac rehab. Vital signs remain stable. Kelly Snyder has increased her met levels. Kelly Snyder has lsot 2.2 kg since starting cardiac rehab    Expected Outcomes Kelly Snyder will continue to participate in cardiac rehab for exercise, nutrition and lifestyle modifications.          ITP Comments:  ITP Comments     Row Name 09/29/23 9078 10/05/23 1134 10/11/23 0807 11/07/23 0751 12/06/23 1403   ITP Comments Kelly Bihari, MD.  Introduction to the [facility] / Intensive Cardiac Rehab.  Initial orientation packet reviewed with the patient. 30-day ITP review. Gerre started cardiac rehab and was able to tolerated low inensity exercise fair for 21 minutes. WIll continue to progress as tolerated. 30-day ITP review. Elara started cardiac rehab on 10/05/23. Taela is off to a fair start to exercise for her fitness level. 30-day ITP review. Annalisia has good attendance and  participation with exercise at cardiac rehab 30-day ITP review. Evangela continues to have good attendance and participation with exercise at cardiac rehab. Saleemah will tenatively complete cardiac rehab on 12/23/23.      Comments: See ITP Comments

## 2023-12-07 ENCOUNTER — Encounter (HOSPITAL_COMMUNITY)
Admission: RE | Admit: 2023-12-07 | Discharge: 2023-12-07 | Disposition: A | Source: Ambulatory Visit | Attending: Cardiology

## 2023-12-07 DIAGNOSIS — I214 Non-ST elevation (NSTEMI) myocardial infarction: Secondary | ICD-10-CM | POA: Diagnosis not present

## 2023-12-09 ENCOUNTER — Telehealth (HOSPITAL_COMMUNITY): Payer: Self-pay

## 2023-12-09 ENCOUNTER — Encounter (HOSPITAL_COMMUNITY): Admission: RE | Admit: 2023-12-09

## 2023-12-09 NOTE — Telephone Encounter (Signed)
 Patient c/o for 10:15 CR class, no reason given.

## 2023-12-12 ENCOUNTER — Telehealth (HOSPITAL_COMMUNITY): Payer: Self-pay

## 2023-12-12 ENCOUNTER — Encounter (HOSPITAL_COMMUNITY)

## 2023-12-12 NOTE — Telephone Encounter (Signed)
 Patient c/o for 10:15 CR class due to weather.

## 2023-12-14 ENCOUNTER — Encounter (HOSPITAL_COMMUNITY)
Admission: RE | Admit: 2023-12-14 | Discharge: 2023-12-14 | Disposition: A | Discharge status: Home / Self Care | Source: Ambulatory Visit | Attending: Cardiology | Admitting: Cardiology

## 2023-12-14 ENCOUNTER — Other Ambulatory Visit (HOSPITAL_COMMUNITY): Payer: Self-pay

## 2023-12-14 DIAGNOSIS — I214 Non-ST elevation (NSTEMI) myocardial infarction: Secondary | ICD-10-CM | POA: Diagnosis not present

## 2023-12-16 ENCOUNTER — Encounter (HOSPITAL_COMMUNITY)
Admission: RE | Admit: 2023-12-16 | Discharge: 2023-12-16 | Disposition: A | Source: Ambulatory Visit | Attending: Cardiology

## 2023-12-16 DIAGNOSIS — I214 Non-ST elevation (NSTEMI) myocardial infarction: Secondary | ICD-10-CM

## 2023-12-17 ENCOUNTER — Other Ambulatory Visit: Payer: Self-pay | Admitting: Family Medicine

## 2023-12-17 DIAGNOSIS — J439 Emphysema, unspecified: Secondary | ICD-10-CM

## 2023-12-19 ENCOUNTER — Encounter (HOSPITAL_COMMUNITY)
Admission: RE | Admit: 2023-12-19 | Discharge: 2023-12-19 | Disposition: A | Discharge status: Home / Self Care | Source: Ambulatory Visit | Attending: Cardiology | Admitting: Cardiology

## 2023-12-19 DIAGNOSIS — I214 Non-ST elevation (NSTEMI) myocardial infarction: Secondary | ICD-10-CM | POA: Diagnosis not present

## 2023-12-21 ENCOUNTER — Telehealth (HOSPITAL_COMMUNITY): Payer: Self-pay

## 2023-12-21 ENCOUNTER — Encounter (HOSPITAL_COMMUNITY): Admission: RE | Admit: 2023-12-21 | Source: Ambulatory Visit

## 2023-12-21 NOTE — Telephone Encounter (Signed)
 Patient c/o sick for 10:15 CR class, left message stating she has a stomach bug.

## 2023-12-23 ENCOUNTER — Encounter (HOSPITAL_COMMUNITY)

## 2023-12-23 VITALS — BP 104/62 | HR 71 | Ht 64.25 in | Wt 135.1 lb

## 2023-12-23 DIAGNOSIS — I214 Non-ST elevation (NSTEMI) myocardial infarction: Secondary | ICD-10-CM | POA: Diagnosis not present

## 2023-12-23 NOTE — Progress Notes (Signed)
 Discharge Progress Report  Patient Details  Name: Kelly Snyder MRN: [id number] Date of Birth: 01-11-1945 Referring Provider:   Flowsheet Row INTENSIVE CARDIAC REHAB ORIENT from 09/29/2023 in [facility]  Referring Provider Dr. Newman Lawrence, MD     Number of Visits: 30  Reason for Discharge:  Patient reached a stable level of exercise. Patient has met program and personal goals.  Smoking History:  Tobacco Use History[1]  Diagnosis:  09/12/23 NSTEMI (non-ST elevated myocardial infarction) [facility])  ADL UCSD:   Initial Exercise Prescription:  Initial Exercise Prescription - 09/29/23 1200       Date of Initial Exercise RX and Referring Provider   Date 09/29/23    Referring Provider Dr. Newman Lawrence, MD    Expected Discharge Date 12/23/23      NuStep   Level 1    SPM 65    Minutes 25    METs 1.5      Prescription Details   Frequency (times per week) 3    Duration Progress to 30 minutes of continuous aerobic without signs/symptoms of physical distress      Intensity   THRR 40-80% of Max Heartrate 57-114    Ratings of Perceived Exertion 11-13    Perceived Dyspnea 0-4      Progression   Progression Continue progressive overload as per policy without signs/symptoms or physical distress.      Resistance Training   Training Prescription Yes    Weight 1    Reps 10-15          Discharge Exercise Prescription (Final Exercise Prescription Changes):  Exercise Prescription Changes - 12/23/23 1035       Response to Exercise   Blood Pressure (Admit) 104/62    Blood Pressure (Exit) 104/78    Heart Rate (Admit) 71 bpm    Heart Rate (Exercise) 90 bpm    Heart Rate (Exit) 79 bpm    Rating of Perceived Exertion (Exercise) 12    Symptoms None    Comments Carlethia completed the cardiac rehab program today.    Duration Continue with 30 min of aerobic exercise without signs/symptoms of physical distress.     Intensity THRR unchanged      Progression   Progression Continue to progress workloads to maintain intensity without signs/symptoms of physical distress.    Average METs 1.9      Resistance Training   Weight 2 lbs    Reps 10-15    Time 5 Minutes      Interval Training   Interval Training No      Recumbant Bike   Level 2    RPM 44    Watts 12    Minutes 15    METs 1.9      NuStep   Level 2    SPM 66    Minutes 15    METs 1.9      Home Exercise Plan   Plans to continue exercise at [employer] (comment)   Exercise at church gym   Frequency Add 1 additional day to program exercise sessions.    Initial Home Exercises Provided 11/04/23          Functional Capacity:  6 Minute Walk     Row Name 09/29/23 1232 12/19/23 1049       6 Minute Walk   Phase Initial Discharge    Distance 720 feet 1153 feet    Distance % Change -- 60.14 %  Distance Feet Change -- 433 ft    Walk Time 6 minutes 6 minutes    # of Rest Breaks 0 0    MPH 1.36 2.18    METS 1.52 2.53    RPE 11 12    Perceived Dyspnea  0 1.5    VO2 Peak 5.33 8.85    Symptoms No Yes (comment)    Comments -- Mild shortness of breath.    Resting HR 74 bpm 73 bpm    Resting BP 112/58 110/58    Resting Oxygen Saturation  94 % --    Exercise Oxygen Saturation  during 6 min walk 98 % 98 %    Max Ex. HR 90 bpm 101 bpm    Max Ex. BP 128/64 142/40    2 Minute Post BP 114/60 116/60       Psychological, QOL, Others - Outcomes: PHQ 2/9:    12/14/2023    5:04 PM 09/29/2023   12:46 PM 09/19/2023    9:39 AM 04/25/2023    8:30 AM 04/14/2023   11:57 AM  Depression screen PHQ 2/9  Decreased Interest 0 0 0 0 0  Down, Depressed, Hopeless 0 0 0 0 0  PHQ - 2 Score 0 0 0 0 0  Altered sleeping 0 0 0 0 0  Tired, decreased energy 0 0 0 0 0  Change in appetite 0 0 0 0 0  Feeling bad or failure about yourself  0 0 0 0 0  Trouble concentrating 0 0 0 0 0  Moving slowly or fidgety/restless 0 0 0 0 0  Suicidal thoughts  0 0 0 0 0  PHQ-9 Score 0 0  0  0  0      Data saved with a previous flowsheet row definition    Quality of Life:  Quality of Life - 12/19/23 1647       Quality of Life   Select Quality of Life      Quality of Life Scores   Health/Function Pre 21.69 %    Health/Function Post 24.43 %    Health/Function % Change 12.63 %    Socioeconomic Pre 23 %    Socioeconomic Post 24 %    Socioeconomic % Change  4.35 %    Psych/Spiritual Pre 26.57 %    Psych/Spiritual Post 24.86 %    Psych/Spiritual % Change -6.44 %    Family Pre 28.5 %    Family Post 24 %    Family % Change -15.79 %    GLOBAL Pre 24 %    GLOBAL Post 24.38 %    GLOBAL % Change 1.58 %          Personal Goals: Goals established at orientation with interventions provided to work toward goal.  Personal Goals and Risk Factors at Admission - 09/29/23 1256       Core Components/Risk Factors/Patient Goals on Admission    Weight Management Yes;Weight Maintenance    Intervention Weight Management: Develop a combined nutrition and exercise program designed to reach desired caloric intake, while maintaining appropriate intake of nutrient and fiber, sodium and fats, and appropriate energy expenditure required for the weight goal.;Weight Management/Obesity: Establish reasonable short term and long term weight goals.;Weight Management: Provide education and appropriate resources to help participant work on and attain dietary goals.    Expected Outcomes Short Term: Continue to assess and modify interventions until short term weight is achieved;Long Term: Adherence to nutrition and physical activity/exercise program aimed toward attainment  of established weight goal;Weight Maintenance: Understanding of the daily nutrition guidelines, which includes 25-35% calories from fat, 7% or less cal from saturated fats, less than 200mg  cholesterol, less than 1.5gm of sodium, & 5 or more servings of fruits and vegetables daily;Understanding  recommendations for meals to include 15-35% energy as protein, 25-35% energy from fat, 35-60% energy from carbohydrates, less than 200mg  of dietary cholesterol, 20-35 gm of total fiber daily;Understanding of distribution of calorie intake throughout the day with the consumption of 4-5 meals/snacks    Hypertension Yes    Intervention Provide education on lifestyle modifcations including regular physical activity/exercise, weight management, moderate sodium restriction and increased consumption of fresh fruit, vegetables, and low fat dairy, alcohol moderation, and smoking cessation.;Monitor prescription use compliance.    Expected Outcomes Short Term: Continued assessment and intervention until BP is < 140/69mm HG in hypertensive participants. < 130/31mm HG in hypertensive participants with diabetes, heart failure or chronic kidney disease.;Long Term: Maintenance of blood pressure at goal levels.    Lipids Yes    Intervention Provide education and support for participant on nutrition & aerobic/resistive exercise along with prescribed medications to achieve LDL 70mg , HDL >40mg .    Expected Outcomes Short Term: Participant states understanding of desired cholesterol values and is compliant with medications prescribed. Participant is following exercise prescription and nutrition guidelines.;Long Term: Cholesterol controlled with medications as prescribed, with individualized exercise RX and with personalized nutrition plan. Value goals: LDL < 70mg , HDL > 40 mg.    Stress Yes    Intervention Offer individual and/or small group education and counseling on adjustment to heart disease, stress management and health-related lifestyle change. Teach and support self-help strategies.;Refer participants experiencing significant psychosocial distress to appropriate mental health specialists for further evaluation and treatment. When possible, include family members and significant others in education/counseling sessions.     Expected Outcomes Short Term: Participant demonstrates changes in health-related behavior, relaxation and other stress management skills, ability to obtain effective social support, and compliance with psychotropic medications if prescribed.;Long Term: Emotional wellbeing is indicated by absence of clinically significant psychosocial distress or social isolation.    Personal Goal Other Yes    Personal Goal ST: know limits (signs and symptoms). LT: Independance, back to ADL's    Intervention Will continue to monitor pt and progress workloads as tolerated without sign or symptom    Expected Outcomes Pt will achieve her goals and increase strength           Personal Goals Discharge:  Goals and Risk Factor Review     Row Name 10/11/23 0811 11/07/23 0802 12/06/23 1406 12/28/23 1507       Core Components/Risk Factors/Patient Goals Review   Personal Goals Review Weight Management/Obesity;Lipids;Hypertension;Stress Weight Management/Obesity;Lipids;Hypertension;Stress Weight Management/Obesity;Lipids;Hypertension;Stress Weight Management/Obesity;Lipids;Hypertension;Stress    Review Jaclene started cardiac rehab on 10/05/23. Alexsia is off to a good start to exercise. Vital signs have been stable. Ionna is doing well  exercise at cardiac rehab. Vital signs have been stable. Danity has increased her met levels. Josy has lsot 1.7 kg since starting cardiac rehab Jerrilynn continues to do  well  exercise at cardiac rehab. Vital signs remain stable. Treasa has increased her met levels. Addyson has lsot 2.2 kg since starting cardiac rehab Ashlei did  well  exercise at cardiac rehab. Vital signs were stable. Kaliyan increased her met levels. Baili has lost 3.0 kg since starting cardiac rehab. Sharaya completed cardiac rehab on 12/23/23    Expected Outcomes Leasia will continue to participate in cardiac  rehab for exercise, nutrition and lifestyle modifications. Kataryna will continue to participate in cardiac rehab  for exercise, nutrition and lifestyle modifications. Raechal will continue to participate in cardiac rehab for exercise, nutrition and lifestyle modifications. Selisa will continue to exercise,follow nutrition and lifestyle modifications upon compleiton of cardiac rehab.       Exercise Goals and Review:  Exercise Goals     Row Name 09/29/23 0923             Exercise Goals   Increase Physical Activity Yes       Intervention Provide advice, education, support and counseling about physical activity/exercise needs.;Develop an individualized exercise prescription for aerobic and resistive training based on initial evaluation findings, risk stratification, comorbidities and participant's personal goals.       Expected Outcomes Short Term: Attend rehab on a regular basis to increase amount of physical activity.;Long Term: Add in home exercise to make exercise part of routine and to increase amount of physical activity.;Long Term: Exercising regularly at least 3-5 days a week.       Increase Strength and Stamina Yes       Intervention Provide advice, education, support and counseling about physical activity/exercise needs.;Develop an individualized exercise prescription for aerobic and resistive training based on initial evaluation findings, risk stratification, comorbidities and participant's personal goals.       Expected Outcomes Short Term: Increase workloads from initial exercise prescription for resistance, speed, and METs.;Short Term: Perform resistance training exercises routinely during rehab and add in resistance training at home;Long Term: Improve cardiorespiratory fitness, muscular endurance and strength as measured by increased METs and functional capacity ( )       Able to understand and use rate of perceived exertion (RPE) scale Yes       Intervention Provide education and explanation on how to use RPE scale       Expected Outcomes Short Term: Able to use RPE daily in rehab to express  subjective intensity level;Long Term:  Able to use RPE to guide intensity level when exercising independently       Knowledge and understanding of Target Heart Rate Range (THRR) Yes       Intervention Provide education and explanation of THRR including how the numbers were predicted and where they are located for reference       Expected Outcomes Short Term: Able to state/look up THRR;Short Term: Able to use daily as guideline for intensity in rehab;Long Term: Able to use THRR to govern intensity when exercising independently       Understanding of Exercise Prescription Yes       Intervention Provide education, explanation, and written materials on patient's individual exercise prescription       Expected Outcomes Short Term: Able to explain program exercise prescription;Long Term: Able to explain home exercise prescription to exercise independently          Exercise Goals Re-Evaluation:  Exercise Goals Re-Evaluation     Row Name 10/05/23 1134 10/31/23 1057 11/04/23 1047 11/30/23 1115 12/19/23 1100     Exercise Goal Re-Evaluation   Exercise Goals Review Increase Physical Activity;Increase Strength and Stamina;Able to understand and use rate of perceived exertion (RPE) scale Increase Physical Activity;Increase Strength and Stamina;Able to understand and use rate of perceived exertion (RPE) scale Increase Physical Activity;Increase Strength and Stamina;Able to understand and use rate of perceived exertion (RPE) scale;Understanding of Exercise Prescription Increase Physical Activity;Increase Strength and Stamina;Able to understand and use rate of perceived exertion (RPE) scale;Understanding of Exercise Prescription Increase  Physical Activity;Increase Strength and Stamina;Able to understand and use rate of perceived exertion (RPE) scale;Understanding of Exercise Prescription;Able to check pulse independently;Knowledge and understanding of Target Heart Rate Range (THRR);Able to understand and use Dyspnea  scale   Comments Ashanti was able to understand and use RPE scale appropriately. [facility]ana's [facility] has a gym/ fitness center, and she will look into using the facility for her exercise. Her goal is to gain assurance that she won't have another heart attack. Reviewed exercise prescription with Reena. She will exercise at her church gym 1-2 days/week. Sandie has talked to the staff at her church's gym and was given the hours. She plans to start exercise there in addition to exercise at cardiac rehab. Shatina will complete the cardiac rehab program on Friday 12/23/23. She will continue exercise at her church gym 30 minutes at least 3 days/week. Discussed using the hand weights at least 2 days/week. She has a pulse oximeter that she can use to monitor her heart rate and oxygen saturation. Discussed pursed-lip breathing, dyspnea scale, and keeping oxygen saturation above 90%. Her functional capacity increased 60% as measured by the .   Expected Outcomes Increase duration from 21 minutes to 30 minutes. Progress workloads as tolerated. Allexus will continue exercise and lifestyle changes to reduce cardiac risk factors. Dana will exercise at gym 1-2 days/week in addition to exercise at cardiac rehab. Arihana will exercise at her church's gym to help build stamina to do ADLs. Brailynn will continue exercise at least 30 minutes, 3-7 days/week upon completion of the cardiac rehab program.    Row Name 12/23/23 1134             Exercise Goal Re-Evaluation   Exercise Goals Review Increase Physical Activity;Increase Strength and Stamina;Able to understand and use rate of perceived exertion (RPE) scale;Understanding of Exercise Prescription;Able to check pulse independently;Knowledge and understanding of Target Heart Rate Range (THRR);Able to understand and use Dyspnea scale       Comments Clessie completed the cardiac rehab program today and will continue exercise at her church gym.       Expected Outcomes Ashika will  continue exercise at least 30 minutes, 3-7 days/week to maintain health and fitness gains.          Nutrition & Weight - Outcomes:  Pre Biometrics - 09/29/23 0921       Pre Biometrics   Waist Circumference 32 inches    Hip Circumference 41.5 inches    Waist to Hip Ratio 0.77 %    Triceps Skinfold 15 mm    % Body Fat 33.4 %    Grip Strength 10 kg    Flexibility 11.5 in    Single Leg Stand 3 seconds          Post Biometrics - 12/23/23 0952        Post  Biometrics   Height 5' 4.25 (1.632 m)    Waist Circumference 32 inches    Hip Circumference 41 inches    Waist to Hip Ratio 0.78 %    BMI (Calculated) 23.02    Triceps Skinfold 14 mm    % Body Fat 32.5 %    Grip Strength 8 kg    Flexibility 12.38 in    Single Leg Stand 3 seconds          Nutrition:   Nutrition Discharge:   Education Questionnaire Score:  Knowledge Questionnaire Score - 12/19/23 1648       Knowledge Questionnaire Score   Pre Score 20/24  Post Score 21/24          Goals reviewed with patient; copy given to patient.Pt graduates from  Intensive/Traditional cardiac rehab program on 12/23/23  with completion of  30 exercise and 25  education sessions. Pt maintained good attendance and progressed nicely during their participation in rehab as evidenced by increased MET level. Jelitza increased her distance on her post exercise walk test by 433 feet and lost 3 kg.  Medication list reconciled. Repeat  PHQ score- 0 .  Pt has made significant lifestyle changes and should be commended for their success.  Sabrinia achieved their goals during cardiac rehab.   Pt plans to continue exercise at the gym at her church 3 days a week. We are proud of Crytal's progress and weight loss!Hadassah Elpidio Quan RN BSN       [1]  Social History Tobacco Use  Smoking Status Former   Current packs/day: 1.00   Average packs/day: 1 pack/day for 58.0 years (58.0 ttl pk-yrs)   Types: Cigarettes   Start date: 01/04/1966    Passive exposure: Past  Smokeless Tobacco Never  Tobacco Comments   quit 10/28/2018

## 2024-01-03 ENCOUNTER — Other Ambulatory Visit: Payer: Self-pay | Admitting: Family Medicine

## 2024-01-03 ENCOUNTER — Other Ambulatory Visit: Payer: Self-pay

## 2024-01-03 ENCOUNTER — Other Ambulatory Visit (HOSPITAL_COMMUNITY): Payer: Self-pay

## 2024-01-03 ENCOUNTER — Ambulatory Visit (HOSPITAL_COMMUNITY)
Admission: RE | Admit: 2024-01-03 | Discharge: 2024-01-03 | Disposition: A | Discharge status: Home / Self Care | Source: Ambulatory Visit | Attending: Cardiology | Admitting: Cardiology

## 2024-01-03 DIAGNOSIS — I5181 Takotsubo syndrome: Secondary | ICD-10-CM | POA: Diagnosis not present

## 2024-01-03 LAB — ECHOCARDIOGRAM COMPLETE
AR max vel: 1.57 cm2
AV Area VTI: 1.59 cm2
AV Area mean vel: 1.56 cm2
AV Mean grad: 4 mmHg
AV Peak grad: 6.5 mmHg
Ao pk vel: 1.27 m/s
Area-P 1/2: 2.83 cm2
S' Lateral: 2.68 cm

## 2024-01-03 MED ORDER — LOSARTAN POTASSIUM 25 MG PO TABS
12.5000 mg | ORAL_TABLET | Freq: Every day | ORAL | 3 refills | Status: AC
  Filled 2024-01-03: qty 45, 90d supply, fill #0

## 2024-01-03 MED ORDER — METOPROLOL SUCCINATE ER 25 MG PO TB24
12.5000 mg | ORAL_TABLET | Freq: Every day | ORAL | 3 refills | Status: AC
  Filled 2024-01-03: qty 45, 90d supply, fill #0

## 2024-01-03 MED ORDER — LOSARTAN POTASSIUM 25 MG PO TABS
12.5000 mg | ORAL_TABLET | Freq: Every day | ORAL | 0 refills | Status: DC
  Filled 2024-01-03: qty 30, 60d supply, fill #0

## 2024-01-03 MED ORDER — METOPROLOL SUCCINATE ER 25 MG PO TB24
12.5000 mg | ORAL_TABLET | Freq: Every day | ORAL | 0 refills | Status: DC
  Filled 2024-01-03: qty 30, 60d supply, fill #0

## 2024-01-03 NOTE — Progress Notes (Signed)
 Heart function has normalized. Aortic valve is not well visualized, but could have mild narrowing. Recommend repeat echocardiogram in 1 year (diagnosis: Aortic stenosis).  Thanks MJP

## 2024-01-06 ENCOUNTER — Telehealth: Payer: Self-pay | Admitting: Cardiology

## 2024-01-06 NOTE — Telephone Encounter (Signed)
 Spoke with patient regarding ECHO results. Per Dr. Elmira: Heart function has normalized. Aortic valve is not well visualized, but could have mild narrowing. Recommend repeat echocardiogram in 1 year (diagnosis: Aortic stenosis).  Results given to patient and verbalized understanding. Pt also inquiring about approval for cataract surgery. Encouraged pt to have office send over pre-auth form. Fax number given. Pt attempted to make follow-up 6 month appointment but advised to call back in a few weeks when schedule available for that period.   Verbalizes understanding of plan. All questions answered.

## 2024-01-06 NOTE — Telephone Encounter (Signed)
 Patient is returning call to review echo.

## 2024-01-18 ENCOUNTER — Other Ambulatory Visit (HOSPITAL_COMMUNITY): Payer: Self-pay

## 2024-01-19 ENCOUNTER — Ambulatory Visit

## 2024-01-23 ENCOUNTER — Encounter: Payer: Self-pay | Admitting: Pharmacist

## 2024-01-23 NOTE — Progress Notes (Signed)
 This patient is appearing on a report for being at risk of failing the adherence measure for cholesterol (statin) medications this calendar year.   Medication: pravastatin  20 mg Last fill date: 01/18/2024 for 90 day supply  Reviewed medication indication, dosing, and goals of therapy.

## 2024-04-16 ENCOUNTER — Encounter
